# Patient Record
Sex: Female | Born: 1935
Health system: Southern US, Community
[De-identification: ages and names within clinical notes are randomized; demographics above are authoritative.]

## PROBLEM LIST (undated history)

## (undated) DIAGNOSIS — E079 Disorder of thyroid, unspecified: Secondary | ICD-10-CM

## (undated) DIAGNOSIS — I4891 Unspecified atrial fibrillation: Secondary | ICD-10-CM

## (undated) DIAGNOSIS — H269 Unspecified cataract: Secondary | ICD-10-CM

## (undated) DIAGNOSIS — M810 Age-related osteoporosis without current pathological fracture: Secondary | ICD-10-CM

## (undated) DIAGNOSIS — I509 Heart failure, unspecified: Secondary | ICD-10-CM

## (undated) DIAGNOSIS — M1990 Unspecified osteoarthritis, unspecified site: Secondary | ICD-10-CM

## (undated) DIAGNOSIS — I10 Essential (primary) hypertension: Secondary | ICD-10-CM

## (undated) HISTORY — PX: FRACTURE SURGERY: SHX138

## (undated) HISTORY — DX: Heart failure, unspecified: I50.9

## (undated) HISTORY — DX: Unspecified atrial fibrillation: I48.91

## (undated) HISTORY — DX: Unspecified cataract: H26.9

## (undated) HISTORY — DX: Disorder of thyroid, unspecified: E07.9

## (undated) HISTORY — DX: Age-related osteoporosis without current pathological fracture: M81.0

## (undated) HISTORY — DX: Unspecified osteoarthritis, unspecified site: M19.90

## (undated) HISTORY — DX: Essential (primary) hypertension: I10

## (undated) HISTORY — PX: EYE SURGERY: SHX253

---

## 2019-01-02 ENCOUNTER — Telehealth: Payer: Self-pay | Admitting: Family Medicine

## 2019-01-02 NOTE — Telephone Encounter (Signed)
Np - call pt's daughter for verbal orders to request medical records from [place].

## 2019-01-08 NOTE — Telephone Encounter (Signed)
I mailed medical release form.

## 2019-02-06 ENCOUNTER — Other Ambulatory Visit: Payer: Self-pay

## 2019-02-06 ENCOUNTER — Ambulatory Visit (INDEPENDENT_AMBULATORY_CARE_PROVIDER_SITE_OTHER): Payer: Medicare Other | Admitting: Family Medicine

## 2019-02-06 ENCOUNTER — Encounter: Payer: Self-pay | Admitting: Family Medicine

## 2019-02-06 VITALS — BP 132/88 | HR 70 | Temp 97.4°F | Ht 60.0 in | Wt 166.8 lb

## 2019-02-06 DIAGNOSIS — Z7901 Long term (current) use of anticoagulants: Secondary | ICD-10-CM

## 2019-02-06 DIAGNOSIS — E039 Hypothyroidism, unspecified: Secondary | ICD-10-CM | POA: Diagnosis not present

## 2019-02-06 DIAGNOSIS — D6869 Other thrombophilia: Secondary | ICD-10-CM

## 2019-02-06 DIAGNOSIS — I4819 Other persistent atrial fibrillation: Secondary | ICD-10-CM

## 2019-02-06 DIAGNOSIS — Z5181 Encounter for therapeutic drug level monitoring: Secondary | ICD-10-CM

## 2019-02-06 NOTE — Patient Instructions (Addendum)
° ° ° °  If you have lab work done today you will be contacted with your lab results within the next 2 weeks.  If you have not heard from us then please contact us. The fastest way to get your results is to register for My Chart. ° ° °IF you received an x-ray today, you will receive an invoice from [facility]. Please contact [facility] at [phone] with questions or concerns regarding your invoice.  ° °IF you received labwork today, you will receive an invoice from [facility]. Please contact [facility] at [phone] with questions or concerns regarding your invoice.  ° °Our billing staff will not be able to assist you with questions regarding bills from these companies. ° °You will be contacted with the lab results as soon as they are available. The fastest way to get your results is to activate your My Chart account. Instructions are located on the last page of this paperwork. If you have not heard from us regarding the results in 2 weeks, please contact this office. °  ° ° ° °

## 2019-02-06 NOTE — Progress Notes (Signed)
Established Patient Office Visit  Subjective:  Patient ID: Ashley Dunn, female    DOB: January 02, 1926  Age: 85 y.o. MRN: [id number]  CC:  Chief Complaint  Patient presents with  . New Patient (Initial Visit)    new to [place], pt is on coumadin and also needs referral for cards    HPI Ashley Dunn presents for   Patient is new to the area from [place] She has a history of multiple. medical problems  Persistent Atrial Fibrillation anticoagulated on coumadin She has been taking coumadin for years She moved here over the summer and has not had her INR checked No blood per nose, rectum or urine No bruising Lab Results  Component Value Date   INR 2.9 03/19/2019   INR 2.7 02/27/2019   INR 1.8 (H) 02/06/2019    Acquired hypothyroidism Hypothyroidism: Patient presents for evaluation of thyroid function. Symptoms consist of denies fatigue, weight changes, heat/cold intolerance, bowel/skin changes or CVS symptoms. The problem has been gradually improving.  Previous thyroid studies include TSH. The hypothyroidism is due to hypothyroidism. Lab Results  Component Value Date   TSH 0.077 (L) 02/06/2019     Past Medical History:  Diagnosis Date  . Arthritis   . Cataract   . CHF (congestive heart failure) ([place])   . Osteoporosis   . Thyroid disease     Past Surgical History:  Procedure Laterality Date  . CESAREAN SECTION    . EYE SURGERY    . FRACTURE SURGERY      Family History  Problem Relation Age of Onset  . Stroke Mother   . Cancer Father   . Hyperlipidemia Brother   . Healthy Daughter   . Healthy Son   . Heart disease Maternal Grandmother   . Heart disease Maternal Grandfather     Social History   Socioeconomic History  . Marital status: Widowed    Spouse name: Not on file  . Number of children: Not on file  . Years of education: Not on file  . Highest education level: Not on file  Occupational History  . Not on file  Social Needs  . Financial resource  strain: Not on file  . Food insecurity    Worry: Not on file    Inability: Not on file  . Transportation needs    Medical: Not on file    Non-medical: Not on file  Tobacco Use  . Smoking status: Never Smoker  . Smokeless tobacco: Never Used  Substance and Sexual Activity  . Alcohol use: Never    Frequency: Never  . Drug use: Never  . Sexual activity: Not Currently  Lifestyle  . Physical activity    Days per week: Not on file    Minutes per session: Not on file  . Stress: Not on file  Relationships  . Social Herbalist on phone: Not on file    Gets together: Not on file    Attends religious service: Not on file    Active member of club or organization: Not on file    Attends meetings of clubs or organizations: Not on file    Relationship status: Not on file  . Intimate partner violence    Fear of current or ex partner: Not on file    Emotionally abused: Not on file    Physically abused: Not on file    Forced sexual activity: Not on file  Other Topics Concern  . Not on file  Social History  Narrative   Lives at home with duaghter    Outpatient Medications Prior to Visit  Medication Sig Dispense Refill  . ARMOUR THYROID 180 MG tablet     . diltiazem (CARDIZEM CD) 240 MG 24 hr capsule     . metoprolol succinate (TOPROL-XL) 25 MG 24 hr tablet     . metoprolol succinate (TOPROL-XL) 50 MG 24 hr tablet     . torsemide (DEMADEX) 20 MG tablet     . warfarin (COUMADIN) 5 MG tablet      No facility-administered medications prior to visit.     Not on File  ROS Review of Systems Review of Systems  Constitutional: Negative for activity change, appetite change, chills and fever.  HENT: Negative for congestion, nosebleeds, trouble swallowing and voice change.   Respiratory: Negative for cough, shortness of breath and wheezing.   Gastrointestinal: Negative for diarrhea, nausea and vomiting.  Genitourinary: Negative for difficulty urinating, dysuria, flank pain and  hematuria.  Musculoskeletal: Negative for back pain, joint swelling and neck pain.  Neurological: Negative for dizziness, speech difficulty, light-headedness and numbness.  See HPI. All other review of systems negative.     Objective:    Physical Exam  BP 132/88 (BP Location: Left Arm, Patient Position: Sitting, Cuff Size: Normal)   Pulse 70   Temp (!) 97.4 F (36.3 C)   Ht 5' (1.524 m)   Wt 166 lb 12.8 oz (75.7 kg)   SpO2 91%   BMI 32.58 kg/m  Wt Readings from Last 3 Encounters:  02/06/19 166 lb 12.8 oz (75.7 kg)   Physical Exam  Constitutional: Oriented to person, place, and time. Appears well-developed and well-nourished.  HENT:  Head: Normocephalic and atraumatic.  Eyes: Conjunctivae and EOM are normal.  Neck: supple, no thyromegaly Cardiovascular: Normal rate, regular rhythm, normal heart sounds and intact distal pulses.  No murmur heard. Pulmonary/Chest: Effort normal and breath sounds normal. No stridor. No respiratory distress. Has no wheezes.  Neurological: Is alert and oriented to person, place, and time.  Skin: Skin is warm. Capillary refill takes less than 2 seconds.  Psychiatric: Has a normal mood and affect. Behavior is normal. Judgment and thought content normal.    There are no preventive care reminders to display for this patient.  There are no preventive care reminders to display for this patient.  No results found for: TSH No results found for: WBC, HGB, HCT, MCV, PLT No results found for: NA, K, CHLORIDE, CO2, GLUCOSE, BUN, CREATININE, BILITOT, ALKPHOS, AST, ALT, PROT, ALBUMIN, CALCIUM, ANIONGAP, EGFR, GFR No results found for: CHOL No results found for: HDL No results found for: LDLCALC No results found for: TRIG No results found for: CHOLHDL No results found for: HGBA1C    Assessment & Plan:   Problem List Items Addressed This Visit    None    Visit Diagnoses    Persistent atrial fibrillation (HCC)    -  Primary   Relevant Medications    diltiazem (CARDIZEM CD) 240 MG 24 hr capsule   metoprolol succinate (TOPROL-XL) 25 MG 24 hr tablet   metoprolol succinate (TOPROL-XL) 50 MG 24 hr tablet   torsemide (DEMADEX) 20 MG tablet   warfarin (COUMADIN) 5 MG tablet   Other Relevant Orders   Protime-INR   CMP14+EGFR   CBC   Lipid panel   Ambulatory referral to Cardiology   Anticoagulated on Coumadin       Relevant Orders   Protime-INR   CMP14+EGFR   Ambulatory referral  to Cardiology   Acquired thrombophilia ([place])       Relevant Orders   Protime-INR   CMP14+EGFR   Ambulatory referral to Cardiology   Acquired hypothyroidism       Relevant Medications   metoprolol succinate (TOPROL-XL) 25 MG 24 hr tablet   metoprolol succinate (TOPROL-XL) 50 MG 24 hr tablet   ARMOUR THYROID 180 MG tablet   Other Relevant Orders   TSH   Lipid panel   Encounter for medication monitoring       Relevant Orders   Lipid panel     Discussed and set up referral for continuity of care Will check levels  Sent records release    No orders of the defined types were placed in this encounter.   Follow-up: No follow-ups on file.    Forrest Moron, MD

## 2019-02-07 ENCOUNTER — Telehealth: Payer: Self-pay | Admitting: Family Medicine

## 2019-02-07 LAB — CMP14+EGFR
ALT: 19 IU/L (ref 0–32)
AST: 32 IU/L (ref 0–40)
Albumin/Globulin Ratio: 1.3 (ref 1.2–2.2)
Albumin: 4.1 g/dL (ref 3.5–4.6)
Alkaline Phosphatase: 92 IU/L (ref 39–117)
BUN/Creatinine Ratio: 29 — ABNORMAL HIGH (ref 12–28)
BUN: 39 mg/dL — ABNORMAL HIGH (ref 10–36)
Bilirubin Total: 0.6 mg/dL (ref 0.0–1.2)
CO2: 29 mmol/L (ref 20–29)
Calcium: 9.7 mg/dL (ref 8.7–10.3)
Chloride: 96 mmol/L (ref 96–106)
Creatinine, Ser: 1.33 mg/dL — ABNORMAL HIGH (ref 0.57–1.00)
GFR calc Af Amer: 40 mL/min/{1.73_m2} — ABNORMAL LOW (ref >59)
GFR calc non Af Amer: 35 mL/min/{1.73_m2} — ABNORMAL LOW (ref >59)
Globulin, Total: 3.1 g/dL (ref 1.5–4.5)
Glucose: 104 mg/dL — ABNORMAL HIGH (ref 65–99)
Potassium: 3.7 mmol/L (ref 3.5–5.2)
Sodium: 144 mmol/L (ref 134–144)
Total Protein: 7.2 g/dL (ref 6.0–8.5)

## 2019-02-07 LAB — CBC
Hematocrit: 48.4 % — ABNORMAL HIGH (ref 34.0–46.6)
Hemoglobin: 15.7 g/dL (ref 11.1–15.9)
MCH: 31.3 pg (ref 26.6–33.0)
MCHC: 32.4 g/dL (ref 31.5–35.7)
MCV: 96 fL (ref 79–97)
Platelets: 179 10*3/uL (ref 150–450)
RBC: 5.02 x10E6/uL (ref 3.77–5.28)
RDW: 12.8 % (ref 11.7–15.4)
WBC: 7.3 10*3/uL (ref 3.4–10.8)

## 2019-02-07 LAB — PROTIME-INR
INR: 1.8 — ABNORMAL HIGH (ref 0.9–1.2)
Prothrombin Time: 18.8 s — ABNORMAL HIGH (ref 9.1–12.0)

## 2019-02-07 LAB — LIPID PANEL
Chol/HDL Ratio: 3.4 ratio (ref 0.0–4.4)
Cholesterol, Total: 165 mg/dL (ref 100–199)
HDL: 48 mg/dL (ref >39)
LDL Chol Calc (NIH): 104 mg/dL — ABNORMAL HIGH (ref 0–99)
Triglycerides: 67 mg/dL (ref 0–149)
VLDL Cholesterol Cal: 13 mg/dL (ref 5–40)

## 2019-02-07 LAB — TSH: TSH: 0.077 u[IU]/mL — ABNORMAL LOW (ref 0.450–4.500)

## 2019-02-07 NOTE — Telephone Encounter (Signed)
Copied from [id number]. Topic: General - Other >> Feb 07, 2019  9:53 AM Leward Quan A wrote: Reason for CRM: Patient called to say that her online pharmacy is [facility] Ph# [phone]

## 2019-02-14 NOTE — Telephone Encounter (Signed)
Pharmacy has been updated.

## 2019-02-18 ENCOUNTER — Telehealth: Payer: Self-pay | Admitting: Family Medicine

## 2019-02-18 NOTE — Telephone Encounter (Signed)
Pt would like her lab results from 02/06/2019. Pt also states that she is having diarrhea for 3 days in a row after she eats lunch. Please advise. Please call [phone]

## 2019-02-20 ENCOUNTER — Encounter: Payer: Self-pay | Admitting: Family Medicine

## 2019-02-20 ENCOUNTER — Other Ambulatory Visit: Payer: Self-pay

## 2019-02-20 ENCOUNTER — Telehealth (INDEPENDENT_AMBULATORY_CARE_PROVIDER_SITE_OTHER): Payer: Medicare Other | Admitting: Family Medicine

## 2019-02-20 DIAGNOSIS — I4819 Other persistent atrial fibrillation: Secondary | ICD-10-CM

## 2019-02-20 DIAGNOSIS — E0580 Other thyrotoxicosis without thyrotoxic crisis or storm: Secondary | ICD-10-CM

## 2019-02-20 DIAGNOSIS — D6869 Other thrombophilia: Secondary | ICD-10-CM

## 2019-02-20 MED ORDER — THYROID 120 MG PO TABS: 120.0000 mg | ORAL_TABLET | Freq: Every day | ORAL | 1 refills | Status: DC

## 2019-02-20 NOTE — Progress Notes (Signed)
Pt is having bowel issues. Daughter says her mother is diagnosing herself saying that it could be ibs. Flare up usually happen in the evening. Keeping a log of food. Wanting to discuss lab result.

## 2019-02-20 NOTE — Progress Notes (Signed)
Telemedicine Encounter- SOAP NOTE Established Patient  This telephone encounter was conducted with the patient's (or proxy's) verbal consent via audio telecommunications: yes/no: Yes Patient was instructed to have this encounter in a suitably private space; and to only have persons present to whom they give permission to participate. In addition, patient identity was confirmed by use of name plus two identifiers (DOB and address).  I discussed the limitations, risks, security and privacy concerns of performing an evaluation and management service by telephone and the availability of in person appointments. I also discussed with the patient that there may be a patient responsible charge related to this service. The patient expressed understanding and agreed to proceed.  I spent a total of TIME; 0 MIN TO 60 MIN: 20 minutes talking with the patient or their proxy.  Cc: REVIEW of labs, loose stools, thyroid disease Subjective   Ashley Dunn is a 85 y.o. established patient. Telephone visit today for  HPI  Patient is reading about the diet for IBS and is following the recommendation for the diet. She denies blood per rectum.   She reports that she has an appointment with Cardiology 02/27/2019 She reports that she takes her Coumadin Her notes from Cardiology shows A. FIB PERSISTENT Lab Results  Component Value Date   INR 1.8 (H) 02/06/2019   She is compliant with her medications  Thyroid disease Pt has a history of hyperthyroidism She reports that she has been up and down on her armour thyroid before.   Lab Results  Component Value Date   TSH 0.077 (L) 02/06/2019      There are no active problems to display for this patient.   Past Medical History:  Diagnosis Date  . Arthritis   . Cataract   . CHF (congestive heart failure) ([place])   . Osteoporosis   . Thyroid disease     Current Outpatient Medications  Medication Sig Dispense Refill  . diltiazem (CARDIZEM CD) 240 MG 24  hr capsule     . metoprolol succinate (TOPROL-XL) 25 MG 24 hr tablet     . metoprolol succinate (TOPROL-XL) 50 MG 24 hr tablet     . thyroid (ARMOUR THYROID) 120 MG tablet Take 1 tablet (120 mg total) by mouth daily before breakfast. 90 tablet 1  . torsemide (DEMADEX) 20 MG tablet     . warfarin (COUMADIN) 5 MG tablet      No current facility-administered medications for this visit.     Not on File  Social History   Socioeconomic History  . Marital status: Widowed    Spouse name: Not on file  . Number of children: Not on file  . Years of education: Not on file  . Highest education level: Not on file  Occupational History  . Not on file  Social Needs  . Financial resource strain: Not on file  . Food insecurity    Worry: Not on file    Inability: Not on file  . Transportation needs    Medical: Not on file    Non-medical: Not on file  Tobacco Use  . Smoking status: Never Smoker  . Smokeless tobacco: Never Used  Substance and Sexual Activity  . Alcohol use: Never    Frequency: Never  . Drug use: Never  . Sexual activity: Not Currently  Lifestyle  . Physical activity    Days per week: Not on file    Minutes per session: Not on file  . Stress: Not on file  Relationships  . Social Herbalist on phone: Not on file    Gets together: Not on file    Attends religious service: Not on file    Active member of club or organization: Not on file    Attends meetings of clubs or organizations: Not on file    Relationship status: Not on file  . Intimate partner violence    Fear of current or ex partner: Not on file    Emotionally abused: Not on file    Physically abused: Not on file    Forced sexual activity: Not on file  Other Topics Concern  . Not on file  Social History Narrative   Lives at home with duaghter    ROS Review of Systems  Constitutional: Negative for activity change, appetite change, chills and fever.  HENT: Negative for congestion, nosebleeds,  trouble swallowing and voice change.   Respiratory: Negative for cough, shortness of breath and wheezing.   Gastrointestinal: ++diarrhea EVERY OTHER DAY, NO nausea and vomiting.  Genitourinary: Negative for difficulty urinating, dysuria, flank pain and hematuria.  Musculoskeletal: Negative for back pain, joint swelling and neck pain.  Neurological: Negative for dizziness, speech difficulty, light-headedness and numbness.  See HPI. All other review of systems negative.   Objective   Vitals as reported by the patient: There were no vitals filed for this visit.  Diagnoses and all orders for this visit:  Iatrogenic hyperthyroidism -     thyroid (ARMOUR THYROID) 120 MG tablet; Take 1 tablet (120 mg total) by mouth daily before breakfast.    Reviewed records Reviewed current labs Continue diet log Decreased dose of armour thyroid  Acquired thrombophilia ([place]) Persistent atrial fibrillation ([place])  Discussed her INR and that she should follow up with Cardiology Did not change dose but she is changing her diet quite a bit  She is compliant but eats greens    I discussed the assessment and treatment plan with the patient. The patient was provided an opportunity to ask questions and all were answered. The patient agreed with the plan and demonstrated an understanding of the instructions.   The patient was advised to call back or seek an in-person evaluation if the symptoms worsen or if the condition fails to improve as anticipated.  I provided 20 minutes of non-face-to-face time during this encounter.  Forrest Moron, MD  Primary Care at [facility]

## 2019-02-21 ENCOUNTER — Telehealth: Payer: Self-pay

## 2019-02-21 NOTE — Telephone Encounter (Signed)
Medical records faxed to Dr. Irven Shelling office per Dr. Nolon Rod request

## 2019-02-26 ENCOUNTER — Encounter: Payer: Self-pay | Admitting: Family Medicine

## 2019-02-26 DIAGNOSIS — I4821 Permanent atrial fibrillation: Secondary | ICD-10-CM | POA: Insufficient documentation

## 2019-02-26 DIAGNOSIS — I4819 Other persistent atrial fibrillation: Secondary | ICD-10-CM | POA: Insufficient documentation

## 2019-02-26 NOTE — Telephone Encounter (Signed)
I told the patient at the time of the follow up virtual visit. However please let the patient know that her INR is just a little less than 2. Continue her blood thinner. She has an appointment with the cardiologist tomorrow.  I already decrease her thyroid dose on 02/20/2019 and will check it in January at her follow up.  Her hemoglobin and cholesterol are great. She does not have any anemia.  Please mail the patient a copy of her results for her records. Will route letter to the clinical pool for printing.

## 2019-02-26 NOTE — Progress Notes (Signed)
Patient referred by Forrest Moron, MD for atrial fibrillation  Subjective:   Ashley Dunn, female    DOB: Mar 24, 1926, 85 y.o.   MRN: [id number]   Chief Complaint  Patient presents with  . Atrial Fibrillation  . New Patient (Initial Visit)    HPI  85 y.o. Caucasian female with hypothyroidism, controlled hypertension, persistent atrial flutter/fibrillation, h/o congestive heart failure.   Patient is here today with her daughter [name], who lives with her.  Both patient and her doctor moved from [place] to [place] earlier this summer.  Patient is quite active and independent for her age.  She walks around with a walker.  She has had diagnosis of atrial fibrillation since 2014.  She was previously seeing a cardiologist, Dr. Dellia Cloud in [place], [place].  She has been on diltiazem XL to 40 mg 1 capsule twice daily, and metoprolol succinate 25 mg in a.m. and 50 mg in p.m. for quite some time.  Although unusual dosing, she has tolerated this well.  In fact, she tells me that she had tachycardia in 120s then attempt to reduce diltiazem 240 mg to once daily.  She is also been on warfarin with well-controlled INR levels for a long time.  She has not had any previous cardioversion.  She has minimal to no symptoms in terms of palpitations, chest pain, shortness of breath with exertion.  She takes torsemide 20 mg 2 tablets daily, with no significant residual leg edema.  She denies any orthopnea, PND.  Her biggest complaint at this time is regarding her bowel movements.  She is seeing her PCP for the same.  She has hemorrhoids that bleed "once in a while".  She denies any melena, hematochezia, hematemesis.  She has arthritis, for which she occasionally takes Tylenol and does not use any NSAIDs.  Patient gets her medications through a mail order pharmacy.[facility] Number [phone]. She currently does not need any refills.   Past Medical History:  Diagnosis Date  .  Arthritis   . Atrial fibrillation ([place])   . Cataract   . CHF (congestive heart failure) ([place])   . Hypertension   . Osteoporosis   . Thyroid disease      Past Surgical History:  Procedure Laterality Date  . CESAREAN SECTION    . EYE SURGERY    . FRACTURE SURGERY       Social History   Socioeconomic History  . Marital status: Widowed    Spouse name: Not on file  . Number of children: Not on file  . Years of education: Not on file  . Highest education level: Not on file  Occupational History  . Not on file  Social Needs  . Financial resource strain: Not on file  . Food insecurity    Worry: Not on file    Inability: Not on file  . Transportation needs    Medical: Not on file    Non-medical: Not on file  Tobacco Use  . Smoking status: Never Smoker  . Smokeless tobacco: Never Used  Substance and Sexual Activity  . Alcohol use: Never    Frequency: Never  . Drug use: Never  . Sexual activity: Not Currently  Lifestyle  . Physical activity    Days per week: Not on file    Minutes per session: Not on file  . Stress: Not on file  Relationships  . Social Herbalist on phone: Not on file    Gets together: Not  on file    Attends religious service: Not on file    Active member of club or organization: Not on file    Attends meetings of clubs or organizations: Not on file    Relationship status: Not on file  . Intimate partner violence    Fear of current or ex partner: Not on file    Emotionally abused: Not on file    Physically abused: Not on file    Forced sexual activity: Not on file  Other Topics Concern  . Not on file  Social History Narrative   Lives at home with duaghter     Family History  Problem Relation Age of Onset  . Stroke Mother   . Cancer Father   . Hyperlipidemia Brother   . Healthy Daughter   . Healthy Son   . Heart disease Maternal Grandmother   . Heart disease Maternal Grandfather      Current Outpatient Medications on File  Prior to Visit  Medication Sig Dispense Refill  . diltiazem (CARDIZEM CD) 240 MG 24 hr capsule Take 240 mg by mouth 2 (two) times daily.     . metoprolol succinate (TOPROL-XL) 25 MG 24 hr tablet Take 25 mg by mouth daily.     . metoprolol succinate (TOPROL-XL) 50 MG 24 hr tablet Take 50 mg by mouth daily.     Marland Kitchen thyroid (ARMOUR THYROID) 120 MG tablet Take 1 tablet (120 mg total) by mouth daily before breakfast. 90 tablet 1  . torsemide (DEMADEX) 20 MG tablet Take 20 mg by mouth 2 (two) times daily.     Marland Kitchen warfarin (COUMADIN) 2.5 MG tablet Take 2.5 mg by mouth daily. Tue, Thur, Sat, Sun    . warfarin (COUMADIN) 5 MG tablet Take 5 mg by mouth daily. Monday, Wed, and friday     No current facility-administered medications on file prior to visit.     Cardiovascular studies:  EKG 02/27/2019: Atypical atrial flutter with variable AV conduction, ventricular rate 96 bpm. Nonspecific ST depression  -Nondiagnostic.   Recent labs: Results for DAVONNE, BABY (MRN [id number]) as of 02/26/2019 15:26  Ref. Range 02/06/2019 10:39  Sodium Latest Ref Range: 134 - 144 mmol/L 144  Potassium Latest Ref Range: 3.5 - 5.2 mmol/L 3.7  Chloride Latest Ref Range: 96 - 106 mmol/L 96  CO2 Latest Ref Range: 20 - 29 mmol/L 29  Glucose Latest Ref Range: 65 - 99 mg/dL 104 (H)  BUN Latest Ref Range: 10 - 36 mg/dL 39 (H)  Creatinine Latest Ref Range: 0.57 - 1.00 mg/dL 1.33 (H)  Calcium Latest Ref Range: 8.7 - 10.3 mg/dL 9.7  BUN/Creatinine Ratio Latest Ref Range: 12 - 28  29 (H)  Alkaline Phosphatase Latest Ref Range: 39 - 117 IU/L 92  Albumin Latest Ref Range: 3.5 - 4.6 g/dL 4.1  Albumin/Globulin Ratio Latest Ref Range: 1.2 - 2.2  1.3  AST Latest Ref Range: 0 - 40 IU/L 32  ALT Latest Ref Range: 0 - 32 IU/L 19  Total Protein Latest Ref Range: 6.0 - 8.5 g/dL 7.2  Total Bilirubin Latest Ref Range: 0.0 - 1.2 mg/dL 0.6  GFR, Est Non African American Latest Ref Range: >59 mL/min/1.73 35 (L)  GFR, Est African American  Latest Ref Range: >59 mL/min/1.73 40 (L)  Total CHOL/HDL Ratio Latest Ref Range: 0.0 - 4.4 ratio 3.4  Cholesterol, Total Latest Ref Range: 100 - 199 mg/dL 165  HDL Cholesterol Latest Ref Range: >39 mg/dL 48  Triglycerides Latest Ref Range:  0 - 149 mg/dL 67  VLDL Cholesterol Cal Latest Ref Range: 5 - 40 mg/dL 13  LDL Chol Calc (NIH) Latest Ref Range: 0 - 99 mg/dL 104 (H)   Results for MCKYNLIE, VANDERSLICE (MRN [id number]) as of 02/26/2019 15:27  Ref. Range 02/06/2019 10:39  WBC Latest Ref Range: 3.4 - 10.8 x10E3/uL 7.3  RBC Latest Ref Range: 3.77 - 5.28 x10E6/uL 5.02  Hemoglobin Latest Ref Range: 11.1 - 15.9 g/dL 15.7  HCT Latest Ref Range: 34.0 - 46.6 % 48.4 (H)  MCV Latest Ref Range: 79 - 97 fL 96  MCH Latest Ref Range: 26.6 - 33.0 pg 31.3  MCHC Latest Ref Range: 31.5 - 35.7 g/dL 32.4  RDW Latest Ref Range: 11.7 - 15.4 % 12.8  Platelets Latest Ref Range: 150 - 450 x10E3/uL 179   Results for BETZY, BARBIER (MRN [id number]) as of 02/26/2019 15:27  Ref. Range 02/06/2019 10:39  Prothrombin Time Latest Ref Range: 9.1 - 12.0 sec 18.8 (H)  INR Latest Ref Range: 0.9 - 1.2  1.8 (H)   Results for SHARIFAH, CHAMPINE (MRN [id number]) as of 02/26/2019 15:27  Ref. Range 02/06/2019 10:39  TSH Latest Ref Range: 0.450 - 4.500 uIU/mL 0.077 (L)    Review of Systems  Constitution: Negative for decreased appetite, malaise/fatigue, weight gain and weight loss.  HENT: Negative for congestion.   Eyes: Negative for visual disturbance.  Cardiovascular: Negative for chest pain, dyspnea on exertion, leg swelling (Controlled), palpitations and syncope.  Respiratory: Negative for cough.   Endocrine: Negative for cold intolerance.  Hematologic/Lymphatic: Does not bruise/bleed easily.  Skin: Negative for itching and rash.  Musculoskeletal: Negative for myalgias.  Gastrointestinal: Negative for abdominal pain, nausea and vomiting.  Genitourinary: Negative for dysuria.  Neurological: Negative for dizziness and weakness.   Psychiatric/Behavioral: The patient is not nervous/anxious.   All other systems reviewed and are negative.        Vitals:   02/27/19 0947  BP: 137/66  Pulse: (!) 46  Temp: 98 F (36.7 C)  SpO2: 94%     Body mass index is 31.25 kg/m. Filed Weights   02/27/19 0947  Weight: 160 lb (72.6 kg)     Objective:   Physical Exam  Constitutional: She is oriented to person, place, and time. She appears well-developed and well-nourished. No distress.  HENT:  Head: Normocephalic and atraumatic.  Eyes: Pupils are equal, round, and reactive to light. Conjunctivae are normal.  Neck: No JVD present.  Cardiovascular: Normal rate. An irregularly irregular rhythm present. Exam reveals decreased pulses.  No murmur heard. Pulses:      Dorsalis pedis pulses are 0 on the right side and 0 on the left side.       Posterior tibial pulses are 1+ on the right side and 1+ on the left side.  Pulmonary/Chest: Effort normal and breath sounds normal. She has no wheezes. She has no rales.  Abdominal: Soft. Bowel sounds are normal. There is no rebound.  Musculoskeletal:        General: No edema.  Lymphadenopathy:    She has no cervical adenopathy.  Neurological: She is alert and oriented to person, place, and time. No cranial nerve deficit.  Skin: Skin is warm and dry.  B/l LE varicose veins without ulcers/edema  Psychiatric: She has a normal mood and affect.  Nursing note and vitals reviewed.         Assessment & Recommendations:    85 y.o. Caucasian female with hypothyroidism, controlled hypertension, persistent atrial flutter/fibrillation, h/o  congestive heart failure.   Permanent atrial fibrillation/flutter: CHA2DS2VASc score 3, annual stroke risk 3.2%. Tolerating warfarin well. Okay to continue.  Currently 5 mg Monday Wednesday, 2.5 mg all other days.  Given her advanced age, lack of symptoms, I will treat this as permanent atrial flutter/fibrillation without any attempt to restore  sinus rhythm.  She is on rather high doses of diltiazem XL 240 mg twice daily, metoprolol succinate 25 mg a.m., and 50 mg p.m.  It appears that she has been on these doses for several years and tolerating well.  I will obtain records from her previous cardiologist.  I have not made any changes to her current regimen today.  INR check in 3 week OV with me in 3 months   Thank you for referring the patient to [place]. Please feel free to contact with any questions.  Nigel Mormon, MD [facility] Pager: [phone] Office: [phone]

## 2019-02-26 NOTE — Telephone Encounter (Signed)
Hi Dr. Nolon Rod,   Ms. Franko is requesting her lab results from 10/14 but I'm the system isn't indicating that they've been reviewed.  Please let me know if you need me to do anything.   Thank you,  Wilfred Curtis

## 2019-02-27 ENCOUNTER — Encounter: Payer: Self-pay | Admitting: Cardiology

## 2019-02-27 ENCOUNTER — Other Ambulatory Visit: Payer: Self-pay

## 2019-02-27 ENCOUNTER — Ambulatory Visit (INDEPENDENT_AMBULATORY_CARE_PROVIDER_SITE_OTHER): Payer: Medicare Other | Admitting: Cardiology

## 2019-02-27 VITALS — BP 137/66 | HR 46 | Temp 98.0°F | Ht 60.0 in | Wt 160.0 lb

## 2019-02-27 DIAGNOSIS — Z7901 Long term (current) use of anticoagulants: Secondary | ICD-10-CM

## 2019-02-27 DIAGNOSIS — I4819 Other persistent atrial fibrillation: Secondary | ICD-10-CM | POA: Diagnosis not present

## 2019-02-27 LAB — POCT INR: INR: 2.7 (ref 2.0–3.0)

## 2019-02-28 ENCOUNTER — Telehealth: Payer: Self-pay | Admitting: Family Medicine

## 2019-02-28 NOTE — Telephone Encounter (Signed)
Pt was given a referral to [facility]. They would like pt's cardo medical records from Gordy Levan, MD phone # [phone] faxed over to their office at fax [phone].

## 2019-03-01 NOTE — Telephone Encounter (Signed)
Call pt and relayed the message to daughter. Lab letter sent to outgoing mail.

## 2019-03-08 ENCOUNTER — Telehealth: Payer: Self-pay | Admitting: Family Medicine

## 2019-03-08 NOTE — Telephone Encounter (Signed)
During the call to reschedule pt. Wished a message to be passed to the doctor. Pt. Asserts that the recent change (180mg  to 120mg  reduction) of her thyroid medication did not resolve her digestive issues and instead caused new ones. Pt. Began taking the 180mg  tablet again and asserts issues went away. With respect to the digestive issues pt. Asserts that with recent exercise and some minor dietary changes the issues have been resolved.

## 2019-03-08 NOTE — Telephone Encounter (Signed)
Called pt LVM for her to call us at her convenience   FR

## 2019-03-19 ENCOUNTER — Other Ambulatory Visit: Payer: Self-pay

## 2019-03-19 ENCOUNTER — Ambulatory Visit (INDEPENDENT_AMBULATORY_CARE_PROVIDER_SITE_OTHER): Payer: Medicare Other | Admitting: Cardiology

## 2019-03-19 DIAGNOSIS — Z5181 Encounter for therapeutic drug level monitoring: Secondary | ICD-10-CM | POA: Diagnosis not present

## 2019-03-19 DIAGNOSIS — I4821 Permanent atrial fibrillation: Secondary | ICD-10-CM | POA: Diagnosis not present

## 2019-03-19 DIAGNOSIS — I4891 Unspecified atrial fibrillation: Secondary | ICD-10-CM | POA: Insufficient documentation

## 2019-03-19 DIAGNOSIS — Z7901 Long term (current) use of anticoagulants: Secondary | ICD-10-CM

## 2019-03-19 LAB — POCT INR: INR: 2.9 (ref 2.0–3.0)

## 2019-03-19 NOTE — Progress Notes (Signed)
New maintenance plan:  5 mg every Mon, Wed; 2.5 mg all other days [] No changeStart Over  Maintenance plan weekly total: 22.5 mg Tablets on hand: 5 mg & 2.5 mg  Total dose from past 7 days: N/A     INR within range, but continues to slightly elevate. Will continue with current regimen and repeat in 3 weeks.

## 2019-04-02 ENCOUNTER — Ambulatory Visit (INDEPENDENT_AMBULATORY_CARE_PROVIDER_SITE_OTHER): Payer: Medicare Other | Admitting: Cardiology

## 2019-04-02 ENCOUNTER — Other Ambulatory Visit: Payer: Self-pay

## 2019-04-02 DIAGNOSIS — I4821 Permanent atrial fibrillation: Secondary | ICD-10-CM | POA: Diagnosis not present

## 2019-04-02 DIAGNOSIS — Z5181 Encounter for therapeutic drug level monitoring: Secondary | ICD-10-CM | POA: Diagnosis not present

## 2019-04-02 DIAGNOSIS — Z7901 Long term (current) use of anticoagulants: Secondary | ICD-10-CM | POA: Diagnosis not present

## 2019-04-02 LAB — POCT INR: INR: 5.7 — AB (ref 2.0–3.0)

## 2019-04-05 NOTE — Progress Notes (Signed)
Current maintenance plan:  5 mg every Mon, Wed; 2.5 mg all other days [] No changeStart Over  Maintenance plan weekly total: 22.5 mg Tablets on hand: 5 mg & 2.5 mg  Total dose from past 7 days: 22.5 mg    Patient was confused by previous INR dosing instructions, and was taking 5 mg all days except for  M and W in which she was taking 2.5 mg on those days. She will hold her Coumadin for the next 2 days and then resume her usual maintenance plan. Will recheck in 1 week.

## 2019-04-05 NOTE — Addendum Note (Signed)
Addended by: Gwinda Maine on: 04/05/2019 12:55 PM   Modules accepted: Level of Service

## 2019-04-09 ENCOUNTER — Ambulatory Visit (INDEPENDENT_AMBULATORY_CARE_PROVIDER_SITE_OTHER): Payer: Medicare Other | Admitting: Cardiology

## 2019-04-09 ENCOUNTER — Other Ambulatory Visit: Payer: Self-pay

## 2019-04-09 DIAGNOSIS — Z7901 Long term (current) use of anticoagulants: Secondary | ICD-10-CM

## 2019-04-09 DIAGNOSIS — Z5181 Encounter for therapeutic drug level monitoring: Secondary | ICD-10-CM | POA: Diagnosis not present

## 2019-04-09 DIAGNOSIS — I4821 Permanent atrial fibrillation: Secondary | ICD-10-CM | POA: Diagnosis not present

## 2019-04-09 LAB — POCT INR: INR: 1.6 — AB (ref 2.0–3.0)

## 2019-04-09 NOTE — Progress Notes (Signed)
New maintenance plan:  5 mg every Tue, Thu, Sat; 2.5 mg all other days [] No changeStart Over  Maintenance plan weekly total: 25 mg11.1% Tablets on hand: 5 mg & 2.5 mg  Total dose from past 7 days: 22.5 mg    Changes were made 5 mg Tues, Thu, and Sat, then 2.5 all the other days, will return in 10 days  GOAL: 2-3 for A fib Last INR: 5.7

## 2019-04-22 ENCOUNTER — Ambulatory Visit (INDEPENDENT_AMBULATORY_CARE_PROVIDER_SITE_OTHER): Payer: Medicare Other | Admitting: Cardiology

## 2019-04-22 ENCOUNTER — Other Ambulatory Visit: Payer: Self-pay

## 2019-04-22 DIAGNOSIS — Z5181 Encounter for therapeutic drug level monitoring: Secondary | ICD-10-CM

## 2019-04-22 DIAGNOSIS — I480 Paroxysmal atrial fibrillation: Secondary | ICD-10-CM

## 2019-04-22 DIAGNOSIS — Z7901 Long term (current) use of anticoagulants: Secondary | ICD-10-CM

## 2019-04-22 LAB — POCT INR: INR: 2.2 (ref 2.0–3.0)

## 2019-04-22 NOTE — Progress Notes (Signed)
Goal: 2.0-3.0 Indication: Afib Today's INR: 2.2 (Last INR 1.6 on 12/15. Dose was changed from 5 mgX2 days 2.5 mg all other days-->5 mg X3 days, 2.5 mg all other days) Current dose: 5 mg T, Th, Sa, 2.5 mg all other days  New dose:Same  Next INR check: 3 weeks  Vernell Leep, MD

## 2019-04-29 ENCOUNTER — Encounter: Payer: Self-pay | Admitting: Family Medicine

## 2019-04-29 ENCOUNTER — Ambulatory Visit (INDEPENDENT_AMBULATORY_CARE_PROVIDER_SITE_OTHER): Payer: Medicare Other | Admitting: Family Medicine

## 2019-04-29 ENCOUNTER — Other Ambulatory Visit: Payer: Self-pay

## 2019-04-29 VITALS — BP 125/73 | HR 73 | Temp 97.8°F | Resp 17 | Ht 60.0 in | Wt 150.4 lb

## 2019-04-29 DIAGNOSIS — E0580 Other thyrotoxicosis without thyrotoxic crisis or storm: Secondary | ICD-10-CM

## 2019-04-29 DIAGNOSIS — D6869 Other thrombophilia: Secondary | ICD-10-CM

## 2019-04-29 DIAGNOSIS — Z23 Encounter for immunization: Secondary | ICD-10-CM | POA: Diagnosis not present

## 2019-04-29 NOTE — Progress Notes (Signed)
Established Patient Office Visit  Subjective:  Patient ID: Ashley Dunn, female    DOB: 1925/10/10  Age: 86 y.o. MRN: [id number]  CC:  Chief Complaint  Patient presents with  . 3 month f/u    hyperthyroidism.  Cardiologist adjusted pt coumadin and pt is back on 180 of the thyroid medication per daughter.  No refills needed at this time    HPI Ashley Dunn presents for   Hyperthyroidism Pt reports that she increased her armour thyroid from 120 to 180 because when she reduced the dose to 120 from 180 she had more loose stools and more frequency with defecation Now she states that her bowels have improved She is stating that she is taking the thyroid medication with all her other meds in the morning She has been taking her thyroid dose this way for years Lab Results  Component Value Date   TSH 0.077 (L) 02/06/2019    Anticoagulated Hypertension  Her Coumadin is being managed by Cardiology Her bp is good on the cardizem, troprol-xl, torsemide She denies dizziness, chest pains, palpitations She avoids salty foods She is compliant with her meds BP Readings from Last 3 Encounters:  04/29/19 125/73  02/27/19 137/66  02/06/19 132/88     Past Medical History:  Diagnosis Date  . Arthritis   . Atrial fibrillation ([place])   . Cataract   . CHF (congestive heart failure) ([place])   . Hypertension   . Osteoporosis   . Thyroid disease     Past Surgical History:  Procedure Laterality Date  . CESAREAN SECTION    . EYE SURGERY    . FRACTURE SURGERY      Family History  Problem Relation Age of Onset  . Stroke Mother   . Cancer Father   . Hyperlipidemia Brother   . Healthy Daughter   . Healthy Son   . Heart disease Maternal Grandmother   . Heart disease Maternal Grandfather     Social History   Socioeconomic History  . Marital status: Widowed    Spouse name: Not on file  . Number of children: Not on file  . Years of education: Not on file  . Highest education level:  Not on file  Occupational History  . Not on file  Tobacco Use  . Smoking status: Never Smoker  . Smokeless tobacco: Never Used  Substance and Sexual Activity  . Alcohol use: Never  . Drug use: Never  . Sexual activity: Not Currently  Other Topics Concern  . Not on file  Social History Narrative   Lives at home with duaghter   Social Determinants of Health   Financial Resource Strain:   . Difficulty of Paying Living Expenses: Not on file  Food Insecurity:   . Worried About Charity fundraiser in the Last Year: Not on file  . Ran Out of Food in the Last Year: Not on file  Transportation Needs:   . Lack of Transportation (Medical): Not on file  . Lack of Transportation (Non-Medical): Not on file  Physical Activity:   . Days of Exercise per Week: Not on file  . Minutes of Exercise per Session: Not on file  Stress:   . Feeling of Stress : Not on file  Social Connections:   . Frequency of Communication with Friends and Family: Not on file  . Frequency of Social Gatherings with Friends and Family: Not on file  . Attends Religious Services: Not on file  . Active Member of Clubs  or Organizations: Not on file  . Attends Archivist Meetings: Not on file  . Marital Status: Not on file  Intimate Partner Violence:   . Fear of Current or Ex-Partner: Not on file  . Emotionally Abused: Not on file  . Physically Abused: Not on file  . Sexually Abused: Not on file    Outpatient Medications Prior to Visit  Medication Sig Dispense Refill  . diltiazem (CARDIZEM CD) 240 MG 24 hr capsule Take 240 mg by mouth 2 (two) times daily.     . metoprolol succinate (TOPROL-XL) 25 MG 24 hr tablet Take 25 mg by mouth daily.     . metoprolol succinate (TOPROL-XL) 50 MG 24 hr tablet Take 50 mg by mouth daily.     Marland Kitchen thyroid (ARMOUR THYROID) 120 MG tablet Take 1 tablet (120 mg total) by mouth daily before breakfast. 90 tablet 1  . torsemide (DEMADEX) 20 MG tablet Take 20 mg by mouth 2 (two) times  daily.     Marland Kitchen warfarin (COUMADIN) 2.5 MG tablet Take 2.5 mg by mouth daily. Tue, Thur, Sat, Sun    . warfarin (COUMADIN) 5 MG tablet Take 5 mg by mouth daily. Monday, Wed, and friday     No facility-administered medications prior to visit.    No Known Allergies  ROS Review of Systems Review of Systems  Constitutional: Negative for activity change, appetite change, chills and fever.  HENT: Negative for congestion, nosebleeds, trouble swallowing and voice change.   Respiratory: Negative for cough, shortness of breath and wheezing.   Gastrointestinal: Negative for diarrhea, nausea and vomiting.  Genitourinary: Negative for difficulty urinating, dysuria, flank pain and hematuria.  Musculoskeletal: Negative for back pain, joint swelling and neck pain.  Neurological: Negative for dizziness, speech difficulty, light-headedness and numbness.  See HPI. All other review of systems negative.     Objective:    Physical Exam  BP 125/73 (BP Location: Right Arm, Patient Position: Sitting, Cuff Size: Normal)   Pulse 73   Temp 97.8 F (36.6 C) (Oral)   Resp 17   Ht 5' (1.524 m)   Wt 150 lb 6.4 oz (68.2 kg)   SpO2 92%   BMI 29.37 kg/m  Wt Readings from Last 3 Encounters:  04/29/19 150 lb 6.4 oz (68.2 kg)  02/27/19 160 lb (72.6 kg)  02/06/19 166 lb 12.8 oz (75.7 kg)   Physical Exam  Constitutional: Oriented to person, place, and time. Appears well-developed and well-nourished.  HENT:  Head: Normocephalic and atraumatic.  Eyes: Conjunctivae and EOM are normal.  Neck: supple, no masses, no thyromegaly Cardiovascular: Normal rate, regular rhythm, normal heart sounds and intact distal pulses.  No murmur heard. Pulmonary/Chest: Effort normal and breath sounds normal. No stridor. No respiratory distress. Has no wheezes.  Neurological: Is alert and oriented to person, place, and time.  Skin: Skin is warm. Capillary refill takes less than 2 seconds.  Psychiatric: Has a normal mood and affect.  Behavior is normal. Judgment and thought content normal.    There are no preventive care reminders to display for this patient.  There are no preventive care reminders to display for this patient.  Lab Results  Component Value Date   TSH 0.077 (L) 02/06/2019   Lab Results  Component Value Date   WBC 7.3 02/06/2019   HGB 15.7 02/06/2019   HCT 48.4 (H) 02/06/2019   MCV 96 02/06/2019   PLT 179 02/06/2019   Lab Results  Component Value Date   NA  144 02/06/2019   K 3.7 02/06/2019   CO2 29 02/06/2019   GLUCOSE 104 (H) 02/06/2019   BUN 39 (H) 02/06/2019   CREATININE 1.33 (H) 02/06/2019   BILITOT 0.6 02/06/2019   ALKPHOS 92 02/06/2019   AST 32 02/06/2019   ALT 19 02/06/2019   PROT 7.2 02/06/2019   ALBUMIN 4.1 02/06/2019   CALCIUM 9.7 02/06/2019   Lab Results  Component Value Date   CHOL 165 02/06/2019   Lab Results  Component Value Date   HDL 48 02/06/2019   Lab Results  Component Value Date   LDLCALC 104 (H) 02/06/2019   Lab Results  Component Value Date   TRIG 67 02/06/2019   Lab Results  Component Value Date   CHOLHDL 3.4 02/06/2019   No results found for: HGBA1C    Assessment & Plan:   Problem List Items Addressed This Visit    None    Visit Diagnoses    Acquired thrombophilia ([place])    -  Primary Discussed that she should continue with the Coumadin clinic   Need for prophylactic vaccination and inoculation against influenza       Iatrogenic hyperthyroidism     Pt now on armour thyroid 180mg , performed counseling on how to take meds, will assess levels Today's labs are on the armour thyroid not taken correctly Will have patient take correctly and reassess   Relevant Orders   TSH   T4, Free   T3, Free      No orders of the defined types were placed in this encounter.   Follow-up: No follow-ups on file.   Will get labs done ahead of the next appt which will be virtual given patient's age  A total of 25 minutes were spent face-to-face with  the patient during this encounter and over half of that time was spent on counseling and coordination of care.   Forrest Moron, MD

## 2019-04-29 NOTE — Patient Instructions (Signed)
° ° ° °  If you have lab work done today you will be contacted with your lab results within the next 2 weeks.  If you have not heard from us then please contact us. The fastest way to get your results is to register for My Chart. ° ° °IF you received an x-ray today, you will receive an invoice from [facility]. Please contact [facility] at [phone] with questions or concerns regarding your invoice.  ° °IF you received labwork today, you will receive an invoice from [facility]. Please contact [facility] at [phone] with questions or concerns regarding your invoice.  ° °Our billing staff will not be able to assist you with questions regarding bills from these companies. ° °You will be contacted with the lab results as soon as they are available. The fastest way to get your results is to activate your My Chart account. Instructions are located on the last page of this paperwork. If you have not heard from us regarding the results in 2 weeks, please contact this office. °  ° ° ° °

## 2019-04-30 LAB — T4, FREE: Free T4: 1.5 ng/dL (ref 0.82–1.77)

## 2019-04-30 LAB — T3, FREE: T3, Free: 5.4 pg/mL — ABNORMAL HIGH (ref 2.0–4.4)

## 2019-04-30 LAB — TSH: TSH: 0.043 u[IU]/mL — ABNORMAL LOW (ref 0.450–4.500)

## 2019-05-07 ENCOUNTER — Telehealth: Payer: Self-pay | Admitting: Family Medicine

## 2019-05-07 NOTE — Telephone Encounter (Signed)
Please reach out to patient. Patient is needing her most recent labs results. Please call Her Daughter Sol Passer [phone]

## 2019-05-09 ENCOUNTER — Ambulatory Visit: Payer: Medicare Other | Admitting: Family Medicine

## 2019-05-09 NOTE — Telephone Encounter (Signed)
Pt requesting lab results. Please Advise

## 2019-05-14 ENCOUNTER — Other Ambulatory Visit: Payer: Self-pay

## 2019-05-14 ENCOUNTER — Other Ambulatory Visit: Payer: Self-pay | Admitting: Family Medicine

## 2019-05-14 ENCOUNTER — Ambulatory Visit (INDEPENDENT_AMBULATORY_CARE_PROVIDER_SITE_OTHER): Payer: Medicare Other | Admitting: Cardiology

## 2019-05-14 DIAGNOSIS — Z5181 Encounter for therapeutic drug level monitoring: Secondary | ICD-10-CM | POA: Diagnosis not present

## 2019-05-14 DIAGNOSIS — Z7901 Long term (current) use of anticoagulants: Secondary | ICD-10-CM

## 2019-05-14 DIAGNOSIS — I4821 Permanent atrial fibrillation: Secondary | ICD-10-CM

## 2019-05-14 DIAGNOSIS — I480 Paroxysmal atrial fibrillation: Secondary | ICD-10-CM | POA: Diagnosis not present

## 2019-05-14 DIAGNOSIS — E0580 Other thyrotoxicosis without thyrotoxic crisis or storm: Secondary | ICD-10-CM

## 2019-05-14 LAB — POCT INR: INR: 2.3 (ref 2.0–3.0)

## 2019-05-14 MED ORDER — THYROID 120 MG PO TABS: 120.0000 mg | ORAL_TABLET | Freq: Every day | ORAL | 1 refills | Status: DC

## 2019-05-14 MED ORDER — METOPROLOL SUCCINATE ER 25 MG PO TB24: 25.0000 mg | ORAL_TABLET | Freq: Every day | ORAL | 3 refills | Status: DC

## 2019-05-14 NOTE — Progress Notes (Signed)
Current maintenance plan:  5 mg every Tue, Thu, Sat; 2.5 mg all other days [x] No changeStart Over  Maintenance plan weekly total: 25 mg Tablets on hand: 5 mg & 2.5 mg  Total dose from past 7 days: 25 mg    INR within range of 2-3 for A fib. Continue with current regimen. Will repeat INR in 1 month.

## 2019-05-14 NOTE — Telephone Encounter (Signed)
Copied from [place] [id number]. Topic: Quick Communication - Rx Refill/Question >> May 14, 2019 11:42 AM Yvette Rack wrote: Medication: ARMOUR THYROID 180 MG tablet  Has the patient contacted their pharmacy? yes   Preferred Pharmacy (with phone number or street name): [place], [place] - [address]  Phone: [phone]   Fax: [phone]  Agent: Please be advised that RX refills may take up to 3 business days. We ask that you follow-up with your pharmacy.

## 2019-05-14 NOTE — Telephone Encounter (Signed)
Pt daughter stated they still have not received a call back with pt lab results. Pt daughter requests call back asap.

## 2019-05-15 NOTE — Telephone Encounter (Signed)
Please advise on lab message. Result are back but I did not see any info to relay to pt and her daughter.

## 2019-05-27 ENCOUNTER — Telehealth: Payer: Self-pay | Admitting: Family Medicine

## 2019-05-27 NOTE — Telephone Encounter (Signed)
Please advise if this is ok to do.

## 2019-05-27 NOTE — Telephone Encounter (Signed)
Pt needs an order placed for 60 mg for armour thyroid because the insurance no longer provides 180 mg. She has the 120 mg and needs the 60 mg to complete the difference.  She would like the order to be placed with [facility] [employer]

## 2019-05-27 NOTE — Telephone Encounter (Signed)
Forwarding refill request to you

## 2019-05-27 NOTE — Telephone Encounter (Signed)
Patient is calling about the lab results but I do not see where labs has been reviewed by you. Not sure what to tell the patient about her labs. Please advise

## 2019-05-27 NOTE — Telephone Encounter (Signed)
Pt would like a cb with her labs and a letter with the results. She states her daughter has called twice and office has not returned call. Please advise

## 2019-05-29 ENCOUNTER — Emergency Department (HOSPITAL_COMMUNITY): Payer: Medicare Other

## 2019-05-29 ENCOUNTER — Encounter: Payer: Self-pay | Admitting: Family Medicine

## 2019-05-29 ENCOUNTER — Inpatient Hospital Stay (HOSPITAL_COMMUNITY)
Admission: EM | Admit: 2019-05-29 | Discharge: 2019-06-02 | DRG: 372 | Disposition: A | Discharge status: Home / Self Care | Payer: Medicare Other | Attending: Internal Medicine | Admitting: Internal Medicine

## 2019-05-29 ENCOUNTER — Other Ambulatory Visit: Payer: Self-pay

## 2019-05-29 ENCOUNTER — Encounter (HOSPITAL_COMMUNITY): Payer: Self-pay | Admitting: Emergency Medicine

## 2019-05-29 ENCOUNTER — Ambulatory Visit (INDEPENDENT_AMBULATORY_CARE_PROVIDER_SITE_OTHER): Payer: Medicare Other | Admitting: Family Medicine

## 2019-05-29 VITALS — BP 136/80 | HR 70 | Temp 98.1°F | Resp 17 | Ht 60.0 in | Wt 164.0 lb

## 2019-05-29 DIAGNOSIS — K567 Ileus, unspecified: Secondary | ICD-10-CM | POA: Diagnosis present

## 2019-05-29 DIAGNOSIS — I4821 Permanent atrial fibrillation: Secondary | ICD-10-CM | POA: Diagnosis present

## 2019-05-29 DIAGNOSIS — M810 Age-related osteoporosis without current pathological fracture: Secondary | ICD-10-CM | POA: Diagnosis present

## 2019-05-29 DIAGNOSIS — Z8249 Family history of ischemic heart disease and other diseases of the circulatory system: Secondary | ICD-10-CM

## 2019-05-29 DIAGNOSIS — R100 Acute abdomen: Secondary | ICD-10-CM

## 2019-05-29 DIAGNOSIS — B964 Proteus (mirabilis) (morganii) as the cause of diseases classified elsewhere: Secondary | ICD-10-CM | POA: Diagnosis present

## 2019-05-29 DIAGNOSIS — N281 Cyst of kidney, acquired: Secondary | ICD-10-CM | POA: Diagnosis present

## 2019-05-29 DIAGNOSIS — I10 Essential (primary) hypertension: Secondary | ICD-10-CM | POA: Diagnosis present

## 2019-05-29 DIAGNOSIS — Z66 Do not resuscitate: Secondary | ICD-10-CM | POA: Diagnosis not present

## 2019-05-29 DIAGNOSIS — I110 Hypertensive heart disease with heart failure: Secondary | ICD-10-CM | POA: Diagnosis not present

## 2019-05-29 DIAGNOSIS — D6869 Other thrombophilia: Secondary | ICD-10-CM | POA: Diagnosis not present

## 2019-05-29 DIAGNOSIS — Z79899 Other long term (current) drug therapy: Secondary | ICD-10-CM | POA: Diagnosis not present

## 2019-05-29 DIAGNOSIS — Z20822 Contact with and (suspected) exposure to covid-19: Secondary | ICD-10-CM | POA: Diagnosis present

## 2019-05-29 DIAGNOSIS — Z7901 Long term (current) use of anticoagulants: Secondary | ICD-10-CM

## 2019-05-29 DIAGNOSIS — R1031 Right lower quadrant pain: Secondary | ICD-10-CM

## 2019-05-29 DIAGNOSIS — E079 Disorder of thyroid, unspecified: Secondary | ICD-10-CM | POA: Diagnosis present

## 2019-05-29 DIAGNOSIS — I509 Heart failure, unspecified: Secondary | ICD-10-CM

## 2019-05-29 DIAGNOSIS — I4892 Unspecified atrial flutter: Secondary | ICD-10-CM | POA: Diagnosis present

## 2019-05-29 DIAGNOSIS — I4819 Other persistent atrial fibrillation: Secondary | ICD-10-CM | POA: Diagnosis not present

## 2019-05-29 DIAGNOSIS — R000 Tachycardia, unspecified: Secondary | ICD-10-CM | POA: Diagnosis present

## 2019-05-29 DIAGNOSIS — R188 Other ascites: Secondary | ICD-10-CM

## 2019-05-29 DIAGNOSIS — E0580 Other thyrotoxicosis without thyrotoxic crisis or storm: Secondary | ICD-10-CM

## 2019-05-29 DIAGNOSIS — E039 Hypothyroidism, unspecified: Secondary | ICD-10-CM | POA: Diagnosis not present

## 2019-05-29 DIAGNOSIS — K3533 Acute appendicitis with perforation and localized peritonitis, with abscess: Principal | ICD-10-CM

## 2019-05-29 DIAGNOSIS — Z79890 Hormone replacement therapy (postmenopausal): Secondary | ICD-10-CM | POA: Diagnosis not present

## 2019-05-29 DIAGNOSIS — M1990 Unspecified osteoarthritis, unspecified site: Secondary | ICD-10-CM | POA: Diagnosis present

## 2019-05-29 LAB — COMPREHENSIVE METABOLIC PANEL
ALT: 18 U/L (ref 0–44)
AST: 23 U/L (ref 15–41)
Albumin: 2.6 g/dL — ABNORMAL LOW (ref 3.5–5.0)
Alkaline Phosphatase: 74 U/L (ref 38–126)
Anion gap: 12 (ref 5–15)
BUN: 22 mg/dL (ref 8–23)
CO2: 32 mmol/L (ref 22–32)
Calcium: 8.9 mg/dL (ref 8.9–10.3)
Chloride: 92 mmol/L — ABNORMAL LOW (ref 98–111)
Creatinine, Ser: 1.11 mg/dL — ABNORMAL HIGH (ref 0.44–1.00)
GFR calc Af Amer: 50 mL/min — ABNORMAL LOW (ref >60)
GFR calc non Af Amer: 43 mL/min — ABNORMAL LOW (ref >60)
Glucose, Bld: 117 mg/dL — ABNORMAL HIGH (ref 70–99)
Potassium: 3.5 mmol/L (ref 3.5–5.1)
Sodium: 136 mmol/L (ref 135–145)
Total Bilirubin: 1 mg/dL (ref 0.3–1.2)
Total Protein: 7.3 g/dL (ref 6.5–8.1)

## 2019-05-29 LAB — CBC
HCT: 42.9 % (ref 36.0–46.0)
Hemoglobin: 13.7 g/dL (ref 12.0–15.0)
MCH: 31.1 pg (ref 26.0–34.0)
MCHC: 31.9 g/dL (ref 30.0–36.0)
MCV: 97.3 fL (ref 80.0–100.0)
Platelets: 271 10*3/uL (ref 150–400)
RBC: 4.41 MIL/uL (ref 3.87–5.11)
RDW: 13.7 % (ref 11.5–15.5)
WBC: 15.2 10*3/uL — ABNORMAL HIGH (ref 4.0–10.5)
nRBC: 0 % (ref 0.0–0.2)

## 2019-05-29 LAB — URINALYSIS, ROUTINE W REFLEX MICROSCOPIC
Bilirubin Urine: NEGATIVE
Glucose, UA: NEGATIVE mg/dL
Hgb urine dipstick: NEGATIVE
Ketones, ur: NEGATIVE mg/dL
Leukocytes,Ua: NEGATIVE
Nitrite: NEGATIVE
Protein, ur: NEGATIVE mg/dL
Specific Gravity, Urine: 1.013 (ref 1.005–1.030)
pH: 5 (ref 5.0–8.0)

## 2019-05-29 LAB — POCT CBC
Granulocyte percent: 84.9 %G — AB (ref 37–80)
HCT, POC: 41.2 % — AB (ref 29–41)
Hemoglobin: 13.8 g/dL (ref 11–14.6)
Lymph, poc: 1.6 (ref 0.6–3.4)
MCH, POC: 31.1 pg (ref 27–31.2)
MCHC: 33.5 g/dL (ref 31.8–35.4)
MCV: 92.7 fL (ref 76–111)
MID (cbc): 0.7 (ref 0–0.9)
MPV: 8.8 fL (ref 0–99.8)
POC Granulocyte: 13.4 — AB (ref 2–6.9)
POC LYMPH PERCENT: 10.4 %L (ref 10–50)
POC MID %: 4.7 %M (ref 0–12)
Platelet Count, POC: 265 10*3/uL (ref 142–424)
RBC: 4.45 M/uL (ref 4.04–5.48)
RDW, POC: 14.5 %
WBC: 15.8 10*3/uL — AB (ref 4.6–10.2)

## 2019-05-29 LAB — APTT: aPTT: 32 seconds (ref 24–36)

## 2019-05-29 LAB — PROTIME-INR
INR: 1.5 — ABNORMAL HIGH (ref 0.8–1.2)
Prothrombin Time: 17.8 seconds — ABNORMAL HIGH (ref 11.4–15.2)

## 2019-05-29 LAB — BRAIN NATRIURETIC PEPTIDE: B Natriuretic Peptide: 445.7 pg/mL — ABNORMAL HIGH (ref 0.0–100.0)

## 2019-05-29 LAB — RESPIRATORY PANEL BY RT PCR (FLU A&B, COVID)
Influenza A by PCR: NEGATIVE
Influenza B by PCR: NEGATIVE
SARS Coronavirus 2 by RT PCR: NEGATIVE

## 2019-05-29 LAB — POC SARS CORONAVIRUS 2 AG -  ED: SARS Coronavirus 2 Ag: NEGATIVE

## 2019-05-29 LAB — LIPASE, BLOOD: Lipase: 23 U/L (ref 11–51)

## 2019-05-29 MED ORDER — THYROID 120 MG PO TABS: 120.0000 mg | ORAL_TABLET | Freq: Every day | ORAL | Status: DC

## 2019-05-29 MED ORDER — METOPROLOL SUCCINATE ER 25 MG PO TB24
25.0000 mg | ORAL_TABLET | Freq: Every day | ORAL | Status: DC
  Administered 2019-05-30 – 2019-06-02 (×4): 25 mg via ORAL
  Filled 2019-05-29 (×4): qty 1

## 2019-05-29 MED ORDER — THYROID 60 MG PO TABS: ORAL_TABLET | ORAL | 1 refills | Status: DC

## 2019-05-29 MED ORDER — PIPERACILLIN-TAZOBACTAM 3.375 G IVPB 30 MIN: 3.3750 g | Freq: Three times a day (TID) | INTRAVENOUS | Status: DC

## 2019-05-29 MED ORDER — WARFARIN SODIUM 2.5 MG PO TABS: 2.5000 mg | ORAL_TABLET | Freq: Every day | ORAL | Status: DC

## 2019-05-29 MED ORDER — WARFARIN SODIUM 5 MG PO TABS: 5.0000 mg | ORAL_TABLET | Freq: Every day | ORAL | Status: DC

## 2019-05-29 MED ORDER — IOHEXOL 300 MG/ML  SOLN
75.0000 mL | Freq: Once | INTRAMUSCULAR | Status: AC | PRN
  Administered 2019-05-29: 75 mL via INTRAVENOUS

## 2019-05-29 MED ORDER — ACETAMINOPHEN 325 MG PO TABS: 650.0000 mg | ORAL_TABLET | Freq: Four times a day (QID) | ORAL | Status: DC | PRN

## 2019-05-29 MED ORDER — METOPROLOL SUCCINATE ER 50 MG PO TB24
50.0000 mg | ORAL_TABLET | Freq: Every day | ORAL | Status: DC
  Administered 2019-05-30 – 2019-06-01 (×3): 50 mg via ORAL
  Filled 2019-05-29 (×3): qty 1

## 2019-05-29 MED ORDER — PIPERACILLIN-TAZOBACTAM 3.375 G IVPB 30 MIN
3.3750 g | Freq: Once | INTRAVENOUS | Status: AC
  Administered 2019-05-29: 3.375 g via INTRAVENOUS
  Filled 2019-05-29: qty 50

## 2019-05-29 MED ORDER — ACETAMINOPHEN 650 MG RE SUPP: 650.0000 mg | Freq: Four times a day (QID) | RECTAL | Status: DC | PRN

## 2019-05-29 MED ORDER — PIPERACILLIN-TAZOBACTAM 3.375 G IVPB
3.3750 g | Freq: Three times a day (TID) | INTRAVENOUS | Status: DC
  Administered 2019-05-30 – 2019-06-01 (×8): 3.375 g via INTRAVENOUS
  Filled 2019-05-29 (×8): qty 50

## 2019-05-29 MED ORDER — SODIUM CHLORIDE 0.9% FLUSH
3.0000 mL | Freq: Once | INTRAVENOUS | Status: AC
  Administered 2019-05-29: 3 mL via INTRAVENOUS

## 2019-05-29 MED ORDER — THYROID 60 MG PO TABS
180.0000 mg | ORAL_TABLET | Freq: Every day | ORAL | Status: DC
  Administered 2019-05-30 – 2019-06-02 (×3): 180 mg via ORAL
  Filled 2019-05-29 (×4): qty 1
  Filled 2019-05-29: qty 3

## 2019-05-29 MED ORDER — TORSEMIDE 20 MG PO TABS
20.0000 mg | ORAL_TABLET | Freq: Two times a day (BID) | ORAL | Status: DC
  Administered 2019-05-29 – 2019-06-02 (×8): 20 mg via ORAL
  Filled 2019-05-29 (×8): qty 1

## 2019-05-29 MED ORDER — DILTIAZEM HCL ER COATED BEADS 240 MG PO CP24
240.0000 mg | ORAL_CAPSULE | Freq: Two times a day (BID) | ORAL | Status: DC
  Administered 2019-05-29 – 2019-06-02 (×8): 240 mg via ORAL
  Filled 2019-05-29 (×8): qty 1

## 2019-05-29 NOTE — ED Notes (Signed)
ED TO INPATIENT HANDOFF REPORT  ED Nurse Name and Phone #: [phone]  S Name/Age/Gender Ashley Dunn 86 y.o. female Room/Bed: 012C/012C  Code Status   Code Status: DNR  Home/SNF/Other Home Patient oriented to: self Is this baseline? Yes   Triage Complete: Triage complete  Chief Complaint Appendiceal abscess [K35.33]  Triage Note Pt arrives to ED from home with complaints of right lower quadrant abdominal pain x1 month. Patient describes the pain as coming and going, and not constant. Patient sent here to ED for possible appendicitis by PCP.     Allergies No Known Allergies  Level of Care/Admitting Diagnosis ED Disposition    ED Disposition Condition [facility]: [place] [[id number]]  Level of Care: Telemetry Cardiac [103]  Covid Evaluation: Asymptomatic Screening Protocol (No Symptoms)  Diagnosis: Appendiceal abscess [253910]  Admitting Physician: Aldine Contes [[id number]]  Attending Physician: Aldine Contes [phone]  Estimated length of stay: past midnight tomorrow  Certification:: I certify this patient will need inpatient services for at least 2 midnights       B Medical/Surgery History Past Medical History:  Diagnosis Date  . Arthritis   . Atrial fibrillation ([place])   . Cataract   . CHF (congestive heart failure) ([place])   . Hypertension   . Osteoporosis   . Thyroid disease    Past Surgical History:  Procedure Laterality Date  . CESAREAN SECTION    . EYE SURGERY    . FRACTURE SURGERY       A IV Location/Drains/Wounds Patient Lines/Drains/Airways Status   Active Line/Drains/Airways    Name:   Placement date:   Placement time:   Site:   Days:   Peripheral IV 05/29/19 Right;Lateral;Upper Arm   05/29/19    1307    Arm   less than 1          Intake/Output Last 24 hours  Intake/Output Summary (Last 24 hours) at 05/29/2019 1824 Last data filed at 05/29/2019 1624 Gross per 24 hour  Intake 3 ml  Output 250  ml  Net -247 ml    Labs/Imaging Results for orders placed or performed during the hospital encounter of 05/29/19 (from the past 48 hour(s))  Lipase, blood     Status: None   Collection Time: 05/29/19 12:23 PM  Result Value Ref Range   Lipase 23 11 - 51 U/L    Comment: Performed at [facility], [place] [address] [place], [place] [address]  Comprehensive metabolic panel     Status: Abnormal   Collection Time: 05/29/19 12:23 PM  Result Value Ref Range   Sodium 136 135 - 145 mmol/L   Potassium 3.5 3.5 - 5.1 mmol/L   Chloride 92 (L) 98 - 111 mmol/L   CO2 32 22 - 32 mmol/L   Glucose, Bld 117 (H) 70 - 99 mg/dL   BUN 22 8 - 23 mg/dL   Creatinine, Ser 1.11 (H) 0.44 - 1.00 mg/dL   Calcium 8.9 8.9 - 10.3 mg/dL   Total Protein 7.3 6.5 - 8.1 g/dL   Albumin 2.6 (L) 3.5 - 5.0 g/dL   AST 23 15 - 41 U/L   ALT 18 0 - 44 U/L   Alkaline Phosphatase 74 38 - 126 U/L   Total Bilirubin 1.0 0.3 - 1.2 mg/dL   GFR calc non Af Amer 43 (L) >60 mL/min   GFR calc Af Amer 50 (L) >60 mL/min   Anion gap 12 5 - 15  Comment: Performed at [facility], [place] [address], [place], [place] [address]  CBC     Status: Abnormal   Collection Time: 05/29/19 12:23 PM  Result Value Ref Range   WBC 15.2 (H) 4.0 - 10.5 K/uL   RBC 4.41 3.87 - 5.11 MIL/uL   Hemoglobin 13.7 12.0 - 15.0 g/dL   HCT 42.9 36.0 - 46.0 %   MCV 97.3 80.0 - 100.0 fL   MCH 31.1 26.0 - 34.0 pg   MCHC 31.9 30.0 - 36.0 g/dL   RDW 13.7 11.5 - 15.5 %   Platelets 271 150 - 400 K/uL   nRBC 0.0 0.0 - 0.2 %    Comment: Performed at [facility], [place] [address], [place], [place] [address]  Brain natriuretic peptide     Status: Abnormal   Collection Time: 05/29/19 12:23 PM  Result Value Ref Range   B Natriuretic Peptide 445.7 (H) 0.0 - 100.0 pg/mL    Comment: Performed at [facility] [address], [place],  [address]  Urinalysis, Routine w reflex microscopic     Status: Abnormal   Collection Time: 05/29/19  1:30 PM   Result Value Ref Range   Color, Urine AMBER (A) YELLOW    Comment: BIOCHEMICALS MAY BE AFFECTED BY COLOR   APPearance HAZY (A) CLEAR   Specific Gravity, Urine 1.013 1.005 - 1.030   pH 5.0 5.0 - 8.0   Glucose, UA NEGATIVE NEGATIVE mg/dL   Hgb urine dipstick NEGATIVE NEGATIVE   Bilirubin Urine NEGATIVE NEGATIVE   Ketones, ur NEGATIVE NEGATIVE mg/dL   Protein, ur NEGATIVE NEGATIVE mg/dL   Nitrite NEGATIVE NEGATIVE   Leukocytes,Ua NEGATIVE NEGATIVE    Comment: Performed at [facility] [address], [place],  [address]  Respiratory Panel by RT PCR (Flu A&B, Covid) - Nasopharyngeal Swab     Status: None   Collection Time: 05/29/19  4:00 PM   Specimen: Nasopharyngeal Swab  Result Value Ref Range   SARS Coronavirus 2 by RT PCR NEGATIVE NEGATIVE    Comment: (NOTE) SARS-CoV-2 target nucleic acids are NOT DETECTED. The SARS-CoV-2 RNA is generally detectable in upper respiratoy specimens during the acute phase of infection. The lowest concentration of SARS-CoV-2 viral copies this assay can detect is 131 copies/mL. A negative result does not preclude SARS-Cov-2 infection and should not be used as the sole basis for treatment or other patient management decisions. A negative result may occur with  improper specimen collection/handling, submission of specimen other than nasopharyngeal swab, presence of viral mutation(s) within the areas targeted by this assay, and inadequate number of viral copies (<131 copies/mL). A negative result must be combined with clinical observations, patient history, and epidemiological information. The expected result is Negative. Fact Sheet for Patients:  PinkCheek.be Fact Sheet for Healthcare Providers:  GravelBags.it This test is not yet ap proved or cleared by the [place] FDA and  has been authorized for detection and/or diagnosis of SARS-CoV-2 by FDA under an Emergency Use  Authorization (EUA). This EUA will remain  in effect (meaning this test can be used) for the duration of the COVID-19 declaration under Section 564(b)(1) of the Act, 21 U.S.C. section 360bbb-3(b)(1), unless the authorization is terminated or revoked sooner.    Influenza A by PCR NEGATIVE NEGATIVE   Influenza B by PCR NEGATIVE NEGATIVE    Comment: (NOTE) The Xpert Xpress SARS-CoV-2/FLU/RSV assay is intended as an aid in  the diagnosis of influenza from Nasopharyngeal  swab specimens and  should not be used as a sole basis for treatment. Nasal washings and  aspirates are unacceptable for Xpert Xpress SARS-CoV-2/FLU/RSV  testing. Fact Sheet for Patients: PinkCheek.be Fact Sheet for Healthcare Providers: GravelBags.it This test is not yet approved or cleared by the [place] FDA and  has been authorized for detection and/or diagnosis of SARS-CoV-2 by  FDA under an Emergency Use Authorization (EUA). This EUA will remain  in effect (meaning this test can be used) for the duration of the  Covid-19 declaration under Section 564(b)(1) of the Act, 21  U.S.C. section 360bbb-3(b)(1), unless the authorization is  terminated or revoked. Performed at [facility], [place] [address], [place], [place] [address]   POC SARS Coronavirus 2 Ag-ED - Nasal Swab (BD Veritor Kit)     Status: None   Collection Time: 05/29/19  4:24 PM  Result Value Ref Range   SARS Coronavirus 2 Ag NEGATIVE NEGATIVE    Comment: (NOTE) SARS-CoV-2 antigen NOT DETECTED.  Negative results are presumptive.  Negative results do not preclude SARS-CoV-2 infection and should not be used as the sole basis for treatment or other patient management decisions, including infection  control decisions, particularly in the presence of clinical signs and  symptoms consistent with COVID-19, or in those who have been in contact with the virus.  Negative results must be combined  with clinical observations, patient history, and epidemiological information. The expected result is Negative. Fact Sheet for Patients: PodPark.tn Fact Sheet for Healthcare Providers: GiftContent.is This test is not yet approved or cleared by the [place] FDA and  has been authorized for detection and/or diagnosis of SARS-CoV-2 by FDA under an Emergency Use Authorization (EUA).  This EUA will remain in effect (meaning this test can be used) for the duration of  the COVID-19 de claration under Section 564(b)(1) of the Act, 21 U.S.C. section 360bbb-3(b)(1), unless the authorization is terminated or revoked sooner.   Protime-INR     Status: Abnormal   Collection Time: 05/29/19  5:15 PM  Result Value Ref Range   Prothrombin Time 17.8 (H) 11.4 - 15.2 seconds   INR 1.5 (H) 0.8 - 1.2    Comment: (NOTE) INR goal varies based on device and disease states. Performed at [facility], [place] [address] [place], [place] [address]   APTT     Status: None   Collection Time: 05/29/19  5:15 PM  Result Value Ref Range   aPTT 32 24 - 36 seconds    Comment: Performed at [facility] [address], [place], [place] [address]   CT ABDOMEN PELVIS W CONTRAST  Result Date: 05/29/2019 CLINICAL DATA:  Right lower quadrant pain. EXAM: CT ABDOMEN AND PELVIS WITH CONTRAST TECHNIQUE: Multidetector CT imaging of the abdomen and pelvis was performed using the standard protocol following bolus administration of intravenous contrast. CONTRAST:  60m OMNIPAQUE IOHEXOL 300 MG/ML  SOLN COMPARISON:  No comparison studies available. FINDINGS: Lower chest: 2.6 cm nodular opacity identified posterior right lower lobe on image 10/series 4. Hepatobiliary: No suspicious focal abnormality within the liver parenchyma. There is no evidence for gallstones, gallbladder wall thickening, or pericholecystic fluid. No intrahepatic or extrahepatic biliary dilation.  Pancreas: No focal mass lesion. No dilatation of the main duct. No intraparenchymal cyst. No peripancreatic edema. Spleen: No splenomegaly. No focal mass lesion. Adrenals/Urinary Tract: Left adrenal thickening noted. Right adrenal gland unremarkable. 3.7 cm cystic lesion noted upper pole right kidney with potential septation or  nodule posteriorly (26/2). Small exophytic cyst noted lower pole right kidney. Several cysts in the left kidney measure up to 3.4 cm. No evidence for hydroureter. The urinary bladder appears normal for the degree of distention. Stomach/Bowel: Stomach is unremarkable. No gastric wall thickening. No evidence of outlet obstruction. Duodenum is normally positioned as is the ligament of Treitz. No small bowel wall thickening. No small bowel dilatation. The terminal ileum is normal. Appendix is mildly dilated and fluid-filled measuring up to 19 mm diameter. This tracks posterior to the cecum and then lateral to the right abdominal wall where a complex multi septated 6.5 x 4.7 x 5.0 cm mass is identified. While this lesion does have an intraperitoneal component, it dissects through the abdominal wall into the dee p lateral abdominal wall muscles just anterior to the iliac crest. Diverticular changes are noted in the left colon without diverticulitis. Vascular/Lymphatic: There is abdominal aortic atherosclerosis without aneurysm. There is no gastrohepatic or hepatoduodenal ligament lymphadenopathy. Small lymph nodes are seen in the right lower quadrant around the large complex cystic process and there is a low-density mildly enlarged lymph node in the central ileocolic mesentery measuring up to 12 mm in short axis diameter (image 45/series 2). No pelvic sidewall lymphadenopathy. Reproductive: The uterus is unremarkable.  There is no adnexal mass. Other: No intraperitoneal free fluid. Musculoskeletal: Bones are diffusely demineralized. IMPRESSION: 1. Markedly dilated thick-walled appendix tracks  laterally to the lateral abdominal wall where a 7 x 5 x 5 cm complex multi septated rim enhancing collection is identified. This is most likely a complex abscess secondary to appendiceal rupture that is dissected into the abdominal wall. Less likely but not entirely excluded, necrotic neoplasm from primary appendiceal adenocarcinoma would be a possibility. Low-density mildly enlarged ileocolic node associated. No intraperitoneal free fluid. 2. Bilateral renal cysts including a 3.7 cm cyst in the upper pole right kidney with potential septation or posterior mural nodule. Nonemergent follow-up MRI in 3 months could be used to assess stability in further characterize. 3. 2.6 cm nodular opacity posterior right lower lobe may be scarring/rounded atelectasis, but close follow-up recommended to ensure stability as neoplasm cannot be excluded. 4.  Aortic Atherosclerois (ICD10-170.0) Electronically Signed   By: Misty Stanley M.D.   On: 05/29/2019 15:17   DG Chest Portable 1 View  Result Date: 05/29/2019 CLINICAL DATA:  Right lower quadrant pain for 1 month.  Hypoxia. EXAM: PORTABLE CHEST 1 VIEW COMPARISON:  None. FINDINGS: Patient rotated right. Apical lordotic positioning. Moderate cardiomegaly. Atherosclerosis in the transverse aorta. Right paratracheal soft tissue fullness is likely due to prominent great vessels. Possible tiny left pleural effusion. Pulmonary interstitial prominence. Mild subsegmental atelectasis at the bases. IMPRESSION: Cardiomegaly with mild pulmonary interstitial prominence which could be chronic or represent mild pulmonary venous congestion. Possible tiny left pleural effusion with bibasilar subsegmental atelectasis. Aortic Atherosclerosis (ICD10-I70.0). Electronically Signed   By: Abigail Miyamoto M.D.   On: 05/29/2019 14:47    Pending Labs Unresulted Labs (From admission, onward)    Start     Ordered   05/30/19 0500  Protime-INR  Tomorrow morning,   R     05/29/19 1617   05/30/19 8657   Basic metabolic panel  Tomorrow morning,   R     05/29/19 1647   05/30/19 0500  CBC  Tomorrow morning,   R     05/29/19 1647   05/29/19 1326  Urine culture  ONCE - STAT,   STAT    Question:  Patient immune  status  Answer:  Normal   05/29/19 1325          Vitals/Pain Today's Vitals   05/29/19 1305 05/29/19 1400 05/29/19 1430 05/29/19 1445  BP: 121/85 128/60 125/68 (!) 138/58  Pulse: (!) 36 (!) 34 (!) 122 89  Resp: (!) '21 20 20 ' (!) 21  Temp:      TempSrc:      SpO2: 93% 93% 97% 98%  PainSc:        Isolation Precautions No active isolations  Medications Medications  piperacillin-tazobactam (ZOSYN) IVPB 3.375 g (has no administration in time range)  diltiazem (CARDIZEM CD) 24 hr capsule 240 mg (has no administration in time range)  metoprolol succinate (TOPROL-XL) 24 hr tablet 25 mg (has no administration in time range)  torsemide (DEMADEX) tablet 20 mg (has no administration in time range)  acetaminophen (TYLENOL) tablet 650 mg (has no administration in time range)    Or  acetaminophen (TYLENOL) suppository 650 mg (has no administration in time range)  metoprolol succinate (TOPROL-XL) 24 hr tablet 50 mg (has no administration in time range)  thyroid (ARMOUR) tablet 180 mg (has no administration in time range)  sodium chloride flush (NS) 0.9 % injection 3 mL (3 mLs Intravenous Push 05/29/19 1324)  iohexol (OMNIPAQUE) 300 MG/ML solution 75 mL (75 mLs Intravenous Contrast Given 05/29/19 1458)  piperacillin-tazobactam (ZOSYN) IVPB 3.375 g (0 g Intravenous Stopped 05/29/19 1624)    Mobility walks with device Low fall risk   Focused Assessments Abscess   R Recommendations: See Admitting Provider Note  Report given to:   Additional Notes: abdominal abscess

## 2019-05-29 NOTE — ED Triage Notes (Signed)
Pt arrives to ED from home with complaints of right lower quadrant abdominal pain x1 month. Patient describes the pain as coming and going, and not constant. Patient sent here to ED for possible appendicitis by PCP.

## 2019-05-29 NOTE — H&P (Signed)
Date: 05/29/2019               Patient Name:  Ashley Dunn MRN: [id number]  DOB: [birth year] Age / Sex: 86 y.o., female   PCP: Forrest Moron, MD         Medical Service: Internal Medicine Teaching Service         Attending Physician: Dr. Aldine Contes, MD    First Contact: Sherry Ruffing, MD, [place] Pager: [place] [place] [phone])  Second Contact: Marva Panda, MD, Person Pager: [place] [phone])       After Hours (After 5p/  First Contact Pager: [phone]  weekends / holidays): Second Contact Pager: [phone]   Chief Complaint: abdominal pain  History of Present Illness: 86 y.o. yo female w/ PMH significant for persistent atrial fibrillation on warfarin, hypertension, congestive heart failure and hypothyroidism presenting with one month duration of right lower abdominal pain. She notes that this has been intermittent in nature and is a dull pain that is relieved with exercise. She has history of indigestion and notes that every once in a while she feels mild epigastric pain that is relieved on its own.  She denies any fevers, chills, nausea/vomiting, diarrhea, constipation, chest pain or shortness of breath. She had an appointment with her PCP this morning  recommended patient to present to ED for CT scan due to concerns for appendicitis.   In the ED, patient with mild tachycardia but afebrile and BP wnl. CT scan in the ED shows large complex abscess secondary to appendiceal rupture but unable to exclude necrotic neoplasm from primary appendiceal adenocarcinoma given mildly enlarged ileocolic nodes. Also noted to have bilateral renal cysts and a nodular opacity posterior right lower lobe of lung. Also noted to have WBC 15.8>15.2.   Meds:  Diltiazem 240mg  bid Metoprolol 25mg  qd Metoprolol 50mg  qHS Armour thyroid 180mg  qd before breakfast  Torsemide 20mg  bid Warfarin 5mg  TThSat, 2.5mg  MWFSun   Allergies: Allergies as of 05/29/2019  . (No Known Allergies)   Past Medical History:  Diagnosis Date    . Arthritis   . Atrial fibrillation ([place])   . Cataract   . CHF (congestive heart failure) ([place])   . Hypertension   . Osteoporosis   . Thyroid disease     Family History:  Family History  Problem Relation Age of Onset  . Stroke Mother   . Cancer Father   . Hyperlipidemia Brother   . Healthy Daughter   . Healthy Son   . Heart disease Maternal Grandmother   . Heart disease Maternal Grandfather      Social History:  Social History   Tobacco Use  . Smoking status: Never Smoker  . Smokeless tobacco: Never Used  Substance Use Topics  . Alcohol use: Never  . Drug use: Never  Patient lives at home with her daughter and uses rolling walker to ambulate. She is independent in her ADL's. She recently moved from [place] in summer 2020. She denies any smoking history, alcohol use or illicit drug use.   Review of Systems: A complete ROS was negative except as per HPI.   Physical Exam: Blood pressure (!) 138/58, pulse 89, temperature 98.6 F (37 C), temperature source Oral, resp. rate (!) 21, SpO2 98 %. Physical Exam Constitutional:      General: She is not in acute distress.    Appearance: She is well-developed. She is not ill-appearing.  HENT:     Head: Normocephalic and atraumatic.     Mouth/Throat:  Mouth: Mucous membranes are moist.     Pharynx: Oropharynx is clear.  Eyes:     Extraocular Movements: Extraocular movements intact.  Cardiovascular:     Rate and Rhythm: Tachycardia present. Rhythm irregular.     Heart sounds: Normal heart sounds. No murmur. No gallop.   Pulmonary:     Effort: Pulmonary effort is normal. No respiratory distress.     Breath sounds: Normal breath sounds. No wheezing or rales.  Abdominal:     General: Bowel sounds are normal. There is no distension.     Palpations: Abdomen is soft.     Tenderness: There is abdominal tenderness in the right lower quadrant.  Skin:    General: Skin is warm and dry.     Capillary Refill: Capillary refill  takes less than 2 seconds.  Neurological:     General: No focal deficit present.     Mental Status: She is alert and oriented to person, place, and time.    EKG: pending  CXR:  Cardiomegaly with mild pulmonary interstitial prominence which could be chronic or represent mild pulmonary venous congestion. Possible tiny left pleural effusion with bibasilar subsegmental atelectasis.  CT ABDOMEN PELVIS W CONTRAST:  IMPRESSION: 1. Markedly dilated thick-walled appendix tracks laterally to the lateral abdominal wall where a 7 x 5 x 5 cm complex multi septated rim enhancing collection is identified. This is most likely a complex abscess secondary to appendiceal rupture that is dissected into the abdominal wall. Less likely but not entirely excluded, necrotic neoplasm from primary appendiceal adenocarcinoma would be a possibility. Low-density mildly enlarged ileocolic node associated. No intraperitoneal free fluid. 2. Bilateral renal cysts including a 3.7 cm cyst in the upper pole right kidney with potential septation or posterior mural nodule. Nonemergent follow-up MRI in 3 months could be used to assess stability in further characterize. 3. 2.6 cm nodular opacity posterior right lower lobe may be scarring/rounded atelectasis, but close follow-up recommended to ensure stability as neoplasm cannot be excluded.  Assessment & Plan by Problem: Ms. Aireana Ryland is a 86year old community dwelling female with PMHx of atrial fibrillation on warfarin, hypertension, hypothyroidism, and congestive heart failure presenting with one month history of intermittent RLQ abdominal pain and imaging concerning for complex abscess secondary to appendiceal rupture into abdominal wall vs necrotic neoplasm from primary appendiceal adenocarcinoma.   Perforated appendicitis with large abscess  Possible necrotic neoplasm from primary appendiceal adenocarcinoma:  Patient with one month history of intermittent RLQ  abdominal pain that is dull in nature. She was noted to have RLQ tenderness at McBurney's point and referred to ED for concerns of appendicitis. Noted to have WBC 15 on presentation. CT concerning for appendiceal rupture with complex abscess 7x5x5cm in lateral abdominal wall vs necrotic neoplasm from primary appendiceal adenocarcinoma given mildly enlarged ileocolic node. Patient evaluated by surgery. No need for emergent surgery at this time. Recommended for IR guided aspiration vs drain placement.  - IV zosyn 3.375g q8h - IR guided aspiration/drain placement in AM  - CBC daily   Persistent atrial fibrillation: Patient with history of A.fib on warfarin w/CHADSVASC2 5 with w/ 7.2% stroke risk. She is rate controlled on diltiazem 240mg  bid and metoprolol 75mg  daily. Patient currently rate controlled. Plan: - Continue diltiazem 240mg  bid - Continue metoprolol 25mg  qd + 50mg  qHS - Holding warfarin for goal INR <1.5 for possible IR guided drainage in AM - PT/INR in AM - Cardiac monitoring   Hypothyroidism: - Continue Armour thyroid 180mg  qd  CHF:  Patient with history of CHF (unclear whether diastolic or systolic) without recent Echo. She is on metoprolol and torsemide at home. BNP 445. However, patient denies any chest pain, shortness of breath, or increased lower extremity swelling. Appears euvolemic on examination.  Plan:  - Metoprolol 25mg  qd + 50mg  qHS - Torsemide 20mg  bid  Hypertension: - Continue home medications as above  FEN/GI:  Diet: Heart healthy, NPO after midnight Fluids: None  VTE Prophylaxis: SCD's  Holding warfarin for possible IR guided aspiration/drain placement in AM if INR <1.5  Code status: DNR/DNI  Dispo: Admit patient to Inpatient with expected length of stay greater than 2 midnights.  Signed: Harvie Heck, MD  Internal Medicine, PGY-1 Pager: [phone] 05/29/2019, 6:17 PM

## 2019-05-29 NOTE — Progress Notes (Signed)
IR requested by Dr. Regenia Skeeter for management of RLQ intra-abdominal fluid collection.  CT abdomen/pelvis reviewed by Dr. Pascal Lux who has approved patient for aspiration/possible drain placement. Patient currently on Coumadin, INR 05/14/2019 2.3. Plan for possible procedure tomorrow in IR if INR <1.5- please hold Coumadin tonight. Will recheck INR in AM. Patient will be NPO at midnight. Dr. Regenia Skeeter aware. Will consent patient tomorrow prior to procedure.  Please call IR with questions/concerns.   Bea Graff Apolo Cutshaw, PA-C 05/29/2019, 4:40 PM

## 2019-05-29 NOTE — ED Notes (Signed)
Pt. Ha sustained spo2 of 78%, with a good pleth wave.  Put pt on 2L [place], pt doesn't wear O2 for any reason.  Sats increased to 100% currently.

## 2019-05-29 NOTE — ED Provider Notes (Addendum)
[facility] EMERGENCY DEPARTMENT Provider Note   CSN: [id number] Arrival date & time: 05/29/19  1156     History Chief Complaint  Patient presents with  . Abdominal Pain    Ashley Dunn is a 86 y.o. female.  HPI 86 year old female with on and off right lower quadrant abdominal pain for a month or so.  However much worse over the last couple days.  Currently while talking to her she has no pain.  No fever, vomiting.  Has chronic back pain but no new back pain.  No urinary symptoms.  Sent over from PCP office for CT scan.   Past Medical History:  Diagnosis Date  . Arthritis   . Atrial fibrillation ([place])   . Cataract   . CHF (congestive heart failure) ([place])   . Hypertension   . Osteoporosis   . Thyroid disease     Patient Active Problem List   Diagnosis Date Noted  . Atrial fibrillation ([place]) 03/19/2019  . Persistent atrial fibrillation ([place]) 02/26/2019    Past Surgical History:  Procedure Laterality Date  . CESAREAN SECTION    . EYE SURGERY    . FRACTURE SURGERY       OB History   No obstetric history on file.     Family History  Problem Relation Age of Onset  . Stroke Mother   . Cancer Father   . Hyperlipidemia Brother   . Healthy Daughter   . Healthy Son   . Heart disease Maternal Grandmother   . Heart disease Maternal Grandfather     Social History   Tobacco Use  . Smoking status: Never Smoker  . Smokeless tobacco: Never Used  Substance Use Topics  . Alcohol use: Never  . Drug use: Never    Home Medications Prior to Admission medications   Medication Sig Start Date End Date Taking? Authorizing Provider  diltiazem (CARDIZEM CD) 240 MG 24 hr capsule Take 240 mg by mouth 2 (two) times daily.  12/28/18   [provider]  metoprolol succinate (TOPROL-XL) 25 MG 24 hr tablet Take 1 tablet (25 mg total) by mouth daily. 05/14/19   Miquel Dunn, NP  metoprolol succinate (TOPROL-XL) 50 MG 24 hr tablet Take 50 mg by mouth  daily.  01/21/19   [provider]  thyroid (ARMOUR THYROID) 120 MG tablet Take 1 tablet (120 mg total) by mouth daily before breakfast. Patient taking differently: Take 180 mg by mouth daily before breakfast.  05/14/19   Forrest Moron, MD  thyroid (ARMOUR THYROID) 60 MG tablet Take 60mg  by mouth daily and add to the 120mg  05/29/19   Delia Chimes A, MD  torsemide (DEMADEX) 20 MG tablet Take 20 mg by mouth 2 (two) times daily.  01/15/19   [provider]  warfarin (COUMADIN) 2.5 MG tablet Take 2.5 mg by mouth daily. Monday, Wednesday , Friday  AND SATURDAY    [provider]  warfarin (COUMADIN) 5 MG tablet Take 5 mg by mouth daily. TAKES 5 MG Tuesday, Thursday AND SATURDAY 12/28/18   [provider]    Allergies    Patient has no known allergies.  Review of Systems   Review of Systems  Constitutional: Negative for fever.  Respiratory: Negative for shortness of breath.   Cardiovascular: Negative for chest pain.  Gastrointestinal: Positive for abdominal pain. Negative for vomiting.  Genitourinary: Negative for dysuria.  Musculoskeletal: Negative for back pain.  All other systems reviewed and are negative.   Physical  Exam Updated Vital Signs BP (!) 138/58   Pulse 89   Temp 98.6 F (37 C) (Oral)   Resp (!) 21   SpO2 98%   Physical Exam Vitals and nursing note reviewed.  Constitutional:      General: She is not in acute distress.    Appearance: She is well-developed. She is obese. She is not ill-appearing or diaphoretic.  HENT:     Head: Normocephalic and atraumatic.     Right Ear: External ear normal.     Left Ear: External ear normal.     Nose: Nose normal.  Eyes:     General:        Right eye: No discharge.        Left eye: No discharge.  Cardiovascular:     Rate and Rhythm: Normal rate and regular rhythm.     Heart sounds: Normal heart sounds.  Pulmonary:     Effort: Pulmonary effort is normal.     Breath sounds: Normal breath  sounds.  Abdominal:     Palpations: Abdomen is soft.     Tenderness: There is abdominal tenderness in the right lower quadrant. There is no right CVA tenderness or left CVA tenderness.  Skin:    General: Skin is warm and dry.  Neurological:     Mental Status: She is alert.  Psychiatric:        Mood and Affect: Mood is not anxious.     ED Results / Procedures / Treatments   Labs (all labs ordered are listed, but only abnormal results are displayed) Labs Reviewed  COMPREHENSIVE METABOLIC PANEL - Abnormal; Notable for the following components:      Result Value   Chloride 92 (*)    Glucose, Bld 117 (*)    Creatinine, Ser 1.11 (*)    Albumin 2.6 (*)    GFR calc non Af Amer 43 (*)    GFR calc Af Amer 50 (*)    All other components within normal limits  CBC - Abnormal; Notable for the following components:   WBC 15.2 (*)    All other components within normal limits  URINALYSIS, ROUTINE W REFLEX MICROSCOPIC - Abnormal; Notable for the following components:   Color, Urine AMBER (*)    APPearance HAZY (*)    All other components within normal limits  BRAIN NATRIURETIC PEPTIDE - Abnormal; Notable for the following components:   B Natriuretic Peptide 445.7 (*)    All other components within normal limits  URINE CULTURE  RESPIRATORY PANEL BY RT PCR (FLU A&B, COVID)  LIPASE, BLOOD  PROTIME-INR  APTT  POC SARS CORONAVIRUS 2 AG -  ED    EKG None  Radiology CT ABDOMEN PELVIS W CONTRAST  Result Date: 05/29/2019 CLINICAL DATA:  Right lower quadrant pain. EXAM: CT ABDOMEN AND PELVIS WITH CONTRAST TECHNIQUE: Multidetector CT imaging of the abdomen and pelvis was performed using the standard protocol following bolus administration of intravenous contrast. CONTRAST:  29mL OMNIPAQUE IOHEXOL 300 MG/ML  SOLN COMPARISON:  No comparison studies available. FINDINGS: Lower chest: 2.6 cm nodular opacity identified posterior right lower lobe on image 10/series 4. Hepatobiliary: No suspicious focal  abnormality within the liver parenchyma. There is no evidence for gallstones, gallbladder wall thickening, or pericholecystic fluid. No intrahepatic or extrahepatic biliary dilation. Pancreas: No focal mass lesion. No dilatation of the main duct. No intraparenchymal cyst. No peripancreatic edema. Spleen: No splenomegaly. No focal mass lesion. Adrenals/Urinary Tract: Left adrenal thickening noted. Right adrenal gland unremarkable.  3.7 cm cystic lesion noted upper pole right kidney with potential septation or nodule posteriorly (26/2). Small exophytic cyst noted lower pole right kidney. Several cysts in the left kidney measure up to 3.4 cm. No evidence for hydroureter. The urinary bladder appears normal for the degree of distention. Stomach/Bowel: Stomach is unremarkable. No gastric wall thickening. No evidence of outlet obstruction. Duodenum is normally positioned as is the ligament of Treitz. No small bowel wall thickening. No small bowel dilatation. The terminal ileum is normal. Appendix is mildly dilated and fluid-filled measuring up to 19 mm diameter. This tracks posterior to the cecum and then lateral to the right abdominal wall where a complex multi septated 6.5 x 4.7 x 5.0 cm mass is identified. While this lesion does have an intraperitoneal component, it dissects through the abdominal wall into the dee p lateral abdominal wall muscles just anterior to the iliac crest. Diverticular changes are noted in the left colon without diverticulitis. Vascular/Lymphatic: There is abdominal aortic atherosclerosis without aneurysm. There is no gastrohepatic or hepatoduodenal ligament lymphadenopathy. Small lymph nodes are seen in the right lower quadrant around the large complex cystic process and there is a low-density mildly enlarged lymph node in the central ileocolic mesentery measuring up to 12 mm in short axis diameter (image 45/series 2). No pelvic sidewall lymphadenopathy. Reproductive: The uterus is unremarkable.   There is no adnexal mass. Other: No intraperitoneal free fluid. Musculoskeletal: Bones are diffusely demineralized. IMPRESSION: 1. Markedly dilated thick-walled appendix tracks laterally to the lateral abdominal wall where a 7 x 5 x 5 cm complex multi septated rim enhancing collection is identified. This is most likely a complex abscess secondary to appendiceal rupture that is dissected into the abdominal wall. Less likely but not entirely excluded, necrotic neoplasm from primary appendiceal adenocarcinoma would be a possibility. Low-density mildly enlarged ileocolic node associated. No intraperitoneal free fluid. 2. Bilateral renal cysts including a 3.7 cm cyst in the upper pole right kidney with potential septation or posterior mural nodule. Nonemergent follow-up MRI in 3 months could be used to assess stability in further characterize. 3. 2.6 cm nodular opacity posterior right lower lobe may be scarring/rounded atelectasis, but close follow-up recommended to ensure stability as neoplasm cannot be excluded. 4.  Aortic Atherosclerois (ICD10-170.0) Electronically Signed   By: Misty Stanley M.D.   On: 05/29/2019 15:17   DG Chest Portable 1 View  Result Date: 05/29/2019 CLINICAL DATA:  Right lower quadrant pain for 1 month.  Hypoxia. EXAM: PORTABLE CHEST 1 VIEW COMPARISON:  None. FINDINGS: Patient rotated right. Apical lordotic positioning. Moderate cardiomegaly. Atherosclerosis in the transverse aorta. Right paratracheal soft tissue fullness is likely due to prominent great vessels. Possible tiny left pleural effusion. Pulmonary interstitial prominence. Mild subsegmental atelectasis at the bases. IMPRESSION: Cardiomegaly with mild pulmonary interstitial prominence which could be chronic or represent mild pulmonary venous congestion. Possible tiny left pleural effusion with bibasilar subsegmental atelectasis. Aortic Atherosclerosis (ICD10-I70.0). Electronically Signed   By: Abigail Miyamoto M.D.   On: 05/29/2019 14:47      Procedures Procedures (including critical care time)  Medications Ordered in ED Medications  piperacillin-tazobactam (ZOSYN) IVPB 3.375 g (3.375 g Intravenous New Bag/Given 05/29/19 1556)  piperacillin-tazobactam (ZOSYN) IVPB 3.375 g (has no administration in time range)  sodium chloride flush (NS) 0.9 % injection 3 mL (3 mLs Intravenous Push 05/29/19 1324)  iohexol (OMNIPAQUE) 300 MG/ML solution 75 mL (75 mLs Intravenous Contrast Given 05/29/19 1458)    ED Course  I have reviewed the triage  vital signs and the nursing notes.  Pertinent labs & imaging results that were available during my care of the patient were reviewed by me and considered in my medical decision making (see chart for details).    MDM Rules/Calculators/A&P                      CT confirms appendicitis but also shows fairly large abscess.  General surgery consulted and recommends IR management and they will follow.  Given IV Zosyn.  Internal medicine teaching service consulted for admission.  Of note, is on coumadin, INR pending. Depending on level may need Vit K and holding coumadin tonight per IR PA. Final Clinical Impression(s) / ED Diagnoses Final diagnoses:  Appendicitis with abscess    Rx / DC Orders ED Discharge Orders    None       Sherwood Gambler, MD 05/29/19 1622    Sherwood Gambler, MD 05/29/19 [phone]

## 2019-05-29 NOTE — Patient Instructions (Addendum)
   1. Go to [facility] 2. Virtual visit follow up after ER 3. Continue to take the Armour Thyroid 180mg  and will recheck in April.  If levels are out of range at that time will refer to Endocrinology   If you have lab work done today you will be contacted with your lab results within the next 2 weeks.  If you have not heard from [place] then please contact us. The fastest way to get your results is to register for My Chart.   IF you received an x-ray today, you will receive an invoice from [facility]. Please contact [facility] at [phone] with questions or concerns regarding your invoice.   IF you received labwork today, you will receive an invoice from [facility]. Please contact [facility] at [phone] with questions or concerns regarding your invoice.   Our billing staff will not be able to assist you with questions regarding bills from these companies.  You will be contacted with the lab results as soon as they are available. The fastest way to get your results is to activate your My Chart account. Instructions are located on the last page of this paperwork. If you have not heard from [place] regarding the results in 2 weeks, please contact this office.

## 2019-05-29 NOTE — Consult Note (Signed)
[facility] Surgery Consult Note  Ashley Dunn 05-May-1925  [id number].    Requesting MD: Sherwood Gambler Chief Complaint/Reason for Consult: appendicitis with abscess  HPI:  Ashley Dunn is a 86yo female PMH permanent atrial fibrillation/flutter on coumadin (last dose 2/2 PM), CHF, HTN, and hypothyroidism who presented to [facility] earlier today from her PCP's office complaining of abdominal pain. States that the pain started about 1 month ago, but has been worse over the last 2 days. It is mostly in her RLQ. The pain is intermittent and improves with exercise. Denies nausea, vomiting, fever, chills, diarrhea. She reports last BM was this AM and was formed, non-bloody.   In the ED she was found to be afebrile. Lab work pertinent for WBC 15.2, creatinine 1.11, albumin 2.6, BNP 445.7. Covid test pending. CT scan obtained and shows a markedly dilated thick-walled appendix tracking laterally to the lateral abdominal wall where a 7 x 5 x 5 cm complex multi septated rim enhancing collection is identified, likely an abscess secondary to appendiceal rupture or less likely necrotic neoplasm.  General surgery asked to see.  Abdominal surgical history: c section, ectopic  Nonsmoker, denies alcohol and illicit drug use as well.  Lives with her daughter NKDA  Review of Systems  Constitutional: Negative for chills and fever.  Respiratory: Negative for shortness of breath.   Cardiovascular: Positive for leg swelling (chronic ). Negative for chest pain.  Gastrointestinal: Positive for abdominal pain. Negative for blood in stool, constipation, diarrhea, melena, nausea and vomiting.  Genitourinary: Negative for dysuria, frequency and urgency.  All other systems reviewed and are negative.   Family History  Problem Relation Age of Onset  . Stroke Mother   . Cancer Father   . Hyperlipidemia Brother   . Healthy Daughter   . Healthy Son   . Heart disease Maternal Grandmother   . Heart disease Maternal  Grandfather     Past Medical History:  Diagnosis Date  . Arthritis   . Atrial fibrillation ([place])   . Cataract   . CHF (congestive heart failure) ([place])   . Hypertension   . Osteoporosis   . Thyroid disease     Past Surgical History:  Procedure Laterality Date  . CESAREAN SECTION    . EYE SURGERY    . FRACTURE SURGERY      Social History:  reports that she has never smoked. She has never used smokeless tobacco. She reports that she does not drink alcohol or use drugs.  Allergies: No Known Allergies  (Not in a hospital admission)   Prior to Admission medications   Medication Sig Start Date End Date Taking? Authorizing Provider  diltiazem (CARDIZEM CD) 240 MG 24 hr capsule Take 240 mg by mouth 2 (two) times daily.  12/28/18   [provider]  metoprolol succinate (TOPROL-XL) 25 MG 24 hr tablet Take 1 tablet (25 mg total) by mouth daily. 05/14/19   Miquel Dunn, NP  metoprolol succinate (TOPROL-XL) 50 MG 24 hr tablet Take 50 mg by mouth daily.  01/21/19   [provider]  thyroid (ARMOUR THYROID) 120 MG tablet Take 1 tablet (120 mg total) by mouth daily before breakfast. Patient taking differently: Take 180 mg by mouth daily before breakfast.  05/14/19   Forrest Moron, MD  thyroid (ARMOUR THYROID) 60 MG tablet Take 60mg  by mouth daily and add to the 120mg  05/29/19   Forrest Moron, MD  torsemide (DEMADEX) 20 MG tablet Take 20 mg by mouth 2 (two)  times daily.  01/15/19   [provider]  warfarin (COUMADIN) 2.5 MG tablet Take 2.5 mg by mouth daily. Monday, Wednesday , Friday  AND SATURDAY    [provider]  warfarin (COUMADIN) 5 MG tablet Take 5 mg by mouth daily. TAKES 5 MG Tuesday, Thursday AND SATURDAY 12/28/18   [provider]    Blood pressure (!) 138/58, pulse 89, temperature 98.6 F (37 C), temperature source Oral, resp. rate (!) 21, SpO2 98 %. Physical Exam: General: pleasant, WD/WN white female who is laying in bed in  NAD HEENT:  Sclera are noninjected.  PERRL.  Ears and nose without any masses or lesions.  Mouth is pink and moist.  Heart: irregularly irregular.  Normal s1,s2. No obvious murmurs, gallops, or rubs noted.  Palpable pedal pulses bilaterally. BL LE edema L>R Lungs: CTAB, no wheezes, rhonchi, or rales noted.  Respiratory effort nonlabored, O2 sat high 80s on supplemental oxygen Abd: soft, ttp in RLQ without peritonitis, ND, +BS, no masses, hernias, or organomegaly MS:  calves soft and nontender, ROM grossly intact in BL UEs Skin: warm and dry with no masses, lesions, or rashes Psych: A&Ox4 with an appropriate affect Neuro: cranial nerves grossly intact, normal speech, though2 process intact  Results for orders placed or performed during the hospital encounter of 05/29/19 (from the past 48 hour(s))  Lipase, blood     Status: None   Collection Time: 05/29/19 12:23 PM  Result Value Ref Range   Lipase 23 11 - 51 U/L    Comment: Performed at [facility], [address] [place], [place] [address]  Comprehensive metabolic panel     Status: Abnormal   Collection Time: 05/29/19 12:23 PM  Result Value Ref Range   Sodium 136 135 - 145 mmol/L   Potassium 3.5 3.5 - 5.1 mmol/L   Chloride 92 (L) 98 - 111 mmol/L   CO2 32 22 - 32 mmol/L   Glucose, Bld 117 (H) 70 - 99 mg/dL   BUN 22 8 - 23 mg/dL   Creatinine, Ser 1.11 (H) 0.44 - 1.00 mg/dL   Calcium 8.9 8.9 - 10.3 mg/dL   Total Protein 7.3 6.5 - 8.1 g/dL   Albumin 2.6 (L) 3.5 - 5.0 g/dL   AST 23 15 - 41 U/L   ALT 18 0 - 44 U/L   Alkaline Phosphatase 74 38 - 126 U/L   Total Bilirubin 1.0 0.3 - 1.2 mg/dL   GFR calc non Af Amer 43 (L) >60 mL/min   GFR calc Af Amer 50 (L) >60 mL/min   Anion gap 12 5 - 15    Comment: Performed at [facility] [address] [place], [place] [address]  CBC     Status: Abnormal   Collection Time: 05/29/19 12:23 PM  Result Value Ref Range   WBC 15.2 (H) 4.0 - 10.5 K/uL   RBC 4.41 3.87 - 5.11 MIL/uL    Hemoglobin 13.7 12.0 - 15.0 g/dL   HCT 42.9 36.0 - 46.0 %   MCV 97.3 80.0 - 100.0 fL   MCH 31.1 26.0 - 34.0 pg   MCHC 31.9 30.0 - 36.0 g/dL   RDW 13.7 11.5 - 15.5 %   Platelets 271 150 - 400 K/uL   nRBC 0.0 0.0 - 0.2 %    Comment: Performed at [facility], [place] [address], [place],  [address]  Brain natriuretic peptide     Status: Abnormal   Collection Time:  05/29/19 12:23 PM  Result Value Ref Range   B Natriuretic Peptide 445.7 (H) 0.0 - 100.0 pg/mL    Comment: Performed at [facility] [address], [place], [place] [address]  Urinalysis, Routine w reflex microscopic     Status: Abnormal   Collection Time: 05/29/19  1:30 PM  Result Value Ref Range   Color, Urine AMBER (A) YELLOW    Comment: BIOCHEMICALS MAY BE AFFECTED BY COLOR   APPearance HAZY (A) CLEAR   Specific Gravity, Urine 1.013 1.005 - 1.030   pH 5.0 5.0 - 8.0   Glucose, UA NEGATIVE NEGATIVE mg/dL   Hgb urine dipstick NEGATIVE NEGATIVE   Bilirubin Urine NEGATIVE NEGATIVE   Ketones, ur NEGATIVE NEGATIVE mg/dL   Protein, ur NEGATIVE NEGATIVE mg/dL   Nitrite NEGATIVE NEGATIVE   Leukocytes,Ua NEGATIVE NEGATIVE    Comment: Performed at [facility] [address] [place], [place] [address]   CT ABDOMEN PELVIS W CONTRAST  Result Date: 05/29/2019 CLINICAL DATA:  Right lower quadrant pain. EXAM: CT ABDOMEN AND PELVIS WITH CONTRAST TECHNIQUE: Multidetector CT imaging of the abdomen and pelvis was performed using the standard protocol following bolus administration of intravenous contrast. CONTRAST:  70mL OMNIPAQUE IOHEXOL 300 MG/ML  SOLN COMPARISON:  No comparison studies available. FINDINGS: Lower chest: 2.6 cm nodular opacity identified posterior right lower lobe on image 10/series 4. Hepatobiliary: No suspicious focal abnormality within the liver parenchyma. There is no evidence for gallstones, gallbladder wall thickening, or pericholecystic fluid. No intrahepatic or extrahepatic biliary dilation.  Pancreas: No focal mass lesion. No dilatation of the main duct. No intraparenchymal cyst. No peripancreatic edema. Spleen: No splenomegaly. No focal mass lesion. Adrenals/Urinary Tract: Left adrenal thickening noted. Right adrenal gland unremarkable. 3.7 cm cystic lesion noted upper pole right kidney with potential septation or nodule posteriorly (26/2). Small exophytic cyst noted lower pole right kidney. Several cysts in the left kidney measure up to 3.4 cm. No evidence for hydroureter. The urinary bladder appears normal for the degree of distention. Stomach/Bowel: Stomach is unremarkable. No gastric wall thickening. No evidence of outlet obstruction. Duodenum is normally positioned as is the ligament of Treitz. No small bowel wall thickening. No small bowel dilatation. The terminal ileum is normal. Appendix is mildly dilated and fluid-filled measuring up to 19 mm diameter. This tracks posterior to the cecum and then lateral to the right abdominal wall where a complex multi septated 6.5 x 4.7 x 5.0 cm mass is identified. While this lesion does have an intraperitoneal component, it dissects through the abdominal wall into the dee p lateral abdominal wall muscles just anterior to the iliac crest. Diverticular changes are noted in the left colon without diverticulitis. Vascular/Lymphatic: There is abdominal aortic atherosclerosis without aneurysm. There is no gastrohepatic or hepatoduodenal ligament lymphadenopathy. Small lymph nodes are seen in the right lower quadrant around the large complex cystic process and there is a low-density mildly enlarged lymph node in the central ileocolic mesentery measuring up to 12 mm in short axis diameter (image 45/series 2). No pelvic sidewall lymphadenopathy. Reproductive: The uterus is unremarkable.  There is no adnexal mass. Other: No intraperitoneal free fluid. Musculoskeletal: Bones are diffusely demineralized. IMPRESSION: 1. Markedly dilated thick-walled appendix tracks  laterally to the lateral abdominal wall where a 7 x 5 x 5 cm complex multi septated rim enhancing collection is identified. This is most likely a complex abscess secondary to appendiceal rupture that is dissected into the abdominal wall. Less likely but not entirely  excluded, necrotic neoplasm from primary appendiceal adenocarcinoma would be a possibility. Low-density mildly enlarged ileocolic node associated. No intraperitoneal free fluid. 2. Bilateral renal cysts including a 3.7 cm cyst in the upper pole right kidney with potential septation or posterior mural nodule. Nonemergent follow-up MRI in 3 months could be used to assess stability in further characterize. 3. 2.6 cm nodular opacity posterior right lower lobe may be scarring/rounded atelectasis, but close follow-up recommended to ensure stability as neoplasm cannot be excluded. 4.  Aortic Atherosclerois (ICD10-170.0) Electronically Signed   By: Misty Stanley M.D.   On: 05/29/2019 15:17   DG Chest Portable 1 View  Result Date: 05/29/2019 CLINICAL DATA:  Right lower quadrant pain for 1 month.  Hypoxia. EXAM: PORTABLE CHEST 1 VIEW COMPARISON:  None. FINDINGS: Patient rotated right. Apical lordotic positioning. Moderate cardiomegaly. Atherosclerosis in the transverse aorta. Right paratracheal soft tissue fullness is likely due to prominent great vessels. Possible tiny left pleural effusion. Pulmonary interstitial prominence. Mild subsegmental atelectasis at the bases. IMPRESSION: Cardiomegaly with mild pulmonary interstitial prominence which could be chronic or represent mild pulmonary venous congestion. Possible tiny left pleural effusion with bibasilar subsegmental atelectasis. Aortic Atherosclerosis (ICD10-I70.0). Electronically Signed   By: Abigail Miyamoto M.D.   On: 05/29/2019 14:47      Assessment/Plan Permanent atrial fibrillation/flutter on coumadin CHF HTN Hypothyroidism  Perforated appendicitis with abscess 7 x 5 x 5 cm - Patient with  suspect perforated appendicitis with abscess. No role for acute surgical intervention. With her age and multiple medical problems, recommend admission to medicine. Needs IR to consult for possible drain placement. Agree with IV zosyn. Will continue to follow.  ID - zosyn VTE - SCDs, hold coumadin and check INR FEN - IVF, NPO Foley - none Follow up - TBD  Wellington Hampshire, [facility] Surgery 05/29/2019, 3:56 PM Please see Amion for pager number during day hours 7:00am-4:30pm

## 2019-05-29 NOTE — Progress Notes (Signed)
Established Patient Office Visit  Subjective:  Patient ID: Ashley Dunn, female    DOB: 14-Mar-1926  Age: 86 y.o. MRN: [id number]  CC:  Chief Complaint  Patient presents with  . RIGHT SIDE PAIN X 1 MONTH    RIGHT GROIN AREA INTERMITTEN PAIN WAS GOING AWAY BUT HAS COME BACK PER PT  . Medication Refill    PT NEEDS 60 MG OF AMOUR AS SHE IS NOW TAKING 180 MG, SHE HAS 120MG 'S AT HOME BUT NEEDS 60 MG.    HPI Ashley Dunn presents for   Right groin PAIN  Patient reports that she has right groin pain that is 3/10 She states that she gets the pain intermittently and is relieved by exercising She had breakfast at 7am of toast, fruit and eggs She denies nausea or vomiting  Indigestion She states that she also had indigestion  She states that she took papaya and ginger which relieved it It resolved and every once in a while she feels a little twinge A few days in the morning she feels lightheaded but it goes away after her   Hyperthyroid At the last visit she was only taking the 120mg  of Armor thyroid  The pharmacy does not have 180 mg of armor thyroid She needs a 60mg  refill  Acquired Thrombophilia She is getting INR checked with the cardiologist She states that she is taking her current meds Her dose of Coumadin is 5mg  T, TH, Sat And 2.5 M, W, F, Sun She denies bleeding from gums, or any hematuria She gets some bleeding from her hemorrhoids.   Past Medical History:  Diagnosis Date  . Arthritis   . Atrial fibrillation ([place])   . Cataract   . CHF (congestive heart failure) ([place])   . Hypertension   . Osteoporosis   . Thyroid disease     Past Surgical History:  Procedure Laterality Date  . CESAREAN SECTION    . EYE SURGERY    . FRACTURE SURGERY      Family History  Problem Relation Age of Onset  . Stroke Mother   . Cancer Father   . Hyperlipidemia Brother   . Healthy Daughter   . Healthy Son   . Heart disease Maternal Grandmother   . Heart disease Maternal  Grandfather     Social History   Socioeconomic History  . Marital status: Widowed    Spouse name: Not on file  . Number of children: Not on file  . Years of education: Not on file  . Highest education level: Not on file  Occupational History  . Not on file  Tobacco Use  . Smoking status: Never Smoker  . Smokeless tobacco: Never Used  Substance and Sexual Activity  . Alcohol use: Never  . Drug use: Never  . Sexual activity: Not Currently  Other Topics Concern  . Not on file  Social History Narrative   Lives at home with duaghter   Social Determinants of Health   Financial Resource Strain:   . Difficulty of Paying Living Expenses: Not on file  Food Insecurity:   . Worried About Charity fundraiser in the Last Year: Not on file  . Ran Out of Food in the Last Year: Not on file  Transportation Needs:   . Lack of Transportation (Medical): Not on file  . Lack of Transportation (Non-Medical): Not on file  Physical Activity:   . Days of Exercise per Week: Not on file  . Minutes of Exercise per Session:  Not on file  Stress:   . Feeling of Stress : Not on file  Social Connections:   . Frequency of Communication with Friends and Family: Not on file  . Frequency of Social Gatherings with Friends and Family: Not on file  . Attends Religious Services: Not on file  . Active Member of Clubs or Organizations: Not on file  . Attends Archivist Meetings: Not on file  . Marital Status: Not on file  Intimate Partner Violence:   . Fear of Current or Ex-Partner: Not on file  . Emotionally Abused: Not on file  . Physically Abused: Not on file  . Sexually Abused: Not on file    Outpatient Medications Prior to Visit  Medication Sig Dispense Refill  . diltiazem (CARDIZEM CD) 240 MG 24 hr capsule Take 240 mg by mouth 2 (two) times daily.     . metoprolol succinate (TOPROL-XL) 25 MG 24 hr tablet Take 1 tablet (25 mg total) by mouth daily. 90 tablet 3  . metoprolol succinate  (TOPROL-XL) 50 MG 24 hr tablet Take 50 mg by mouth daily.     Marland Kitchen thyroid (ARMOUR THYROID) 120 MG tablet Take 1 tablet (120 mg total) by mouth daily before breakfast. (Patient taking differently: Take 180 mg by mouth daily before breakfast. ) 90 tablet 1  . torsemide (DEMADEX) 20 MG tablet Take 20 mg by mouth 2 (two) times daily.     Marland Kitchen warfarin (COUMADIN) 2.5 MG tablet Take 2.5 mg by mouth daily. Monday, Wednesday , Friday  AND SATURDAY    . warfarin (COUMADIN) 5 MG tablet Take 5 mg by mouth daily. TAKES 5 MG Tuesday, Thursday AND SATURDAY     No facility-administered medications prior to visit.    No Known Allergies  ROS Review of Systems SEE HPI   Objective:    Physical Exam  BP 136/80 (BP Location: Right Arm, Patient Position: Sitting, Cuff Size: Normal)   Pulse 70   Temp 98.1 F (36.7 C) (Oral)   Resp 17   Ht 5' (1.524 m)   Wt 164 lb (74.4 kg)   SpO2 96%   BMI 32.03 kg/m  Wt Readings from Last 3 Encounters:  05/29/19 164 lb (74.4 kg)  04/29/19 150 lb 6.4 oz (68.2 kg)  02/27/19 160 lb (72.6 kg)   Physical Exam  Constitutional: Oriented to person, place, and time. Appears well-developed and well-nourished.  HENT:  Head: Normocephalic and atraumatic.  Eyes: Conjunctivae and EOM are normal.  Cardiovascular: Normal rate, regular rhythm, normal heart sounds and intact distal pulses.  No murmur heard. Pulmonary/Chest: Effort normal and breath sounds normal. No stridor. No respiratory distress. Has no wheezes.  Abdomen: nondistended, normoactive bs, soft, exquisitely tender to palpation in the RLQ at McBurney's Point No umbilical tenderness REBOUND PRESENT Neurological: Is alert and oriented to person, place, and time.  Skin: Skin is warm. Capillary refill takes less than 2 seconds.  Psychiatric: Has a normal mood and affect. Behavior is normal. Judgment and thought content normal.    There are no preventive care reminders to display for this patient.  There are no  preventive care reminders to display for this patient.  Lab Results  Component Value Date   TSH 0.043 (L) 04/29/2019   Lab Results  Component Value Date   WBC 7.3 02/06/2019   HGB 15.7 02/06/2019   HCT 48.4 (H) 02/06/2019   MCV 96 02/06/2019   PLT 179 02/06/2019   Lab Results  Component Value Date  NA 144 02/06/2019   K 3.7 02/06/2019   CO2 29 02/06/2019   GLUCOSE 104 (H) 02/06/2019   BUN 39 (H) 02/06/2019   CREATININE 1.33 (H) 02/06/2019   BILITOT 0.6 02/06/2019   ALKPHOS 92 02/06/2019   AST 32 02/06/2019   ALT 19 02/06/2019   PROT 7.2 02/06/2019   ALBUMIN 4.1 02/06/2019   CALCIUM 9.7 02/06/2019   Lab Results  Component Value Date   CHOL 165 02/06/2019   Lab Results  Component Value Date   HDL 48 02/06/2019   Lab Results  Component Value Date   LDLCALC 104 (H) 02/06/2019   Lab Results  Component Value Date   TRIG 67 02/06/2019   Lab Results  Component Value Date   CHOLHDL 3.4 02/06/2019   No results found for: HGBA1C    Assessment & Plan:   Problem List Items Addressed This Visit    None    Visit Diagnoses    RLQ abdominal pain    -  Primary Based on patient exam very concerning for appendicitis given her RLQ pain at McBurney's point, guarding and leukocytosis.  ADVISED TO GO TO THE ER   Relevant Orders   POCT CBC   RLQ distress       Relevant Orders   POCT CBC   Acquired thrombophilia (HCC)       Iatrogenic hyperthyroidism    -  Add the 60mg  armour thyroid to 120mg  to have dose of 180mg  Continue to take meds as instructed Will reassess in April and if the levels are unimproved will send to Endocrinology   Relevant Medications   thyroid (ARMOUR THYROID) 60 MG tablet   Acute abdomen    -  Will send to the ER for evaluation for acute appendicitis       Meds ordered this encounter  Medications  . thyroid (ARMOUR THYROID) 60 MG tablet    Sig: Take 60mg  by mouth daily and add to the 120mg     Dispense:  90 tablet    Refill:  1     Follow-up: No follow-ups on file.   A total of 45 minutes were spent face-to-face with the patient during this encounter and over half of that time was spent on counseling and coordination of care.   Forrest Moron, MD

## 2019-05-29 NOTE — ED Notes (Signed)
Pt ambulated with personal cane and 1 assist to the restroom. Urine with culture collected. Labs have been drawn.

## 2019-05-30 ENCOUNTER — Inpatient Hospital Stay (HOSPITAL_COMMUNITY): Payer: Medicare Other

## 2019-05-30 DIAGNOSIS — K3533 Acute appendicitis with perforation and localized peritonitis, with abscess: Principal | ICD-10-CM

## 2019-05-30 DIAGNOSIS — Z66 Do not resuscitate: Secondary | ICD-10-CM

## 2019-05-30 DIAGNOSIS — Z79890 Hormone replacement therapy (postmenopausal): Secondary | ICD-10-CM

## 2019-05-30 DIAGNOSIS — Z7901 Long term (current) use of anticoagulants: Secondary | ICD-10-CM

## 2019-05-30 DIAGNOSIS — I509 Heart failure, unspecified: Secondary | ICD-10-CM

## 2019-05-30 DIAGNOSIS — E039 Hypothyroidism, unspecified: Secondary | ICD-10-CM

## 2019-05-30 DIAGNOSIS — Z79899 Other long term (current) drug therapy: Secondary | ICD-10-CM

## 2019-05-30 DIAGNOSIS — I4819 Other persistent atrial fibrillation: Secondary | ICD-10-CM

## 2019-05-30 DIAGNOSIS — I110 Hypertensive heart disease with heart failure: Secondary | ICD-10-CM

## 2019-05-30 LAB — PROTIME-INR
INR: 1.5 — ABNORMAL HIGH (ref 0.8–1.2)
Prothrombin Time: 17.8 seconds — ABNORMAL HIGH (ref 11.4–15.2)

## 2019-05-30 LAB — BASIC METABOLIC PANEL
Anion gap: 12 (ref 5–15)
BUN: 17 mg/dL (ref 8–23)
CO2: 33 mmol/L — ABNORMAL HIGH (ref 22–32)
Calcium: 8.6 mg/dL — ABNORMAL LOW (ref 8.9–10.3)
Chloride: 96 mmol/L — ABNORMAL LOW (ref 98–111)
Creatinine, Ser: 1.09 mg/dL — ABNORMAL HIGH (ref 0.44–1.00)
GFR calc Af Amer: 51 mL/min — ABNORMAL LOW (ref >60)
GFR calc non Af Amer: 44 mL/min — ABNORMAL LOW (ref >60)
Glucose, Bld: 101 mg/dL — ABNORMAL HIGH (ref 70–99)
Potassium: 3.4 mmol/L — ABNORMAL LOW (ref 3.5–5.1)
Sodium: 141 mmol/L (ref 135–145)

## 2019-05-30 LAB — CBC
HCT: 39.1 % (ref 36.0–46.0)
Hemoglobin: 12.9 g/dL (ref 12.0–15.0)
MCH: 31.2 pg (ref 26.0–34.0)
MCHC: 33 g/dL (ref 30.0–36.0)
MCV: 94.4 fL (ref 80.0–100.0)
Platelets: 263 10*3/uL (ref 150–400)
RBC: 4.14 MIL/uL (ref 3.87–5.11)
RDW: 13.7 % (ref 11.5–15.5)
WBC: 13.7 10*3/uL — ABNORMAL HIGH (ref 4.0–10.5)
nRBC: 0 % (ref 0.0–0.2)

## 2019-05-30 LAB — URINE CULTURE: Special Requests: NORMAL

## 2019-05-30 MED ORDER — FENTANYL CITRATE (PF) 100 MCG/2ML IJ SOLN
INTRAMUSCULAR | Status: AC | PRN
  Administered 2019-05-30: 25 ug via INTRAVENOUS

## 2019-05-30 MED ORDER — FENTANYL CITRATE (PF) 100 MCG/2ML IJ SOLN
INTRAMUSCULAR | Status: AC
  Filled 2019-05-30: qty 2

## 2019-05-30 MED ORDER — WARFARIN SODIUM 5 MG PO TABS
5.0000 mg | ORAL_TABLET | Freq: Once | ORAL | Status: AC
  Administered 2019-05-30: 5 mg via ORAL
  Filled 2019-05-30: qty 1

## 2019-05-30 MED ORDER — MIDAZOLAM HCL 2 MG/2ML IJ SOLN
INTRAMUSCULAR | Status: AC
  Filled 2019-05-30: qty 4

## 2019-05-30 MED ORDER — MIDAZOLAM HCL 2 MG/2ML IJ SOLN
INTRAMUSCULAR | Status: AC | PRN
  Administered 2019-05-30: 1 mg via INTRAVENOUS
  Administered 2019-05-30: 0.5 mg via INTRAVENOUS

## 2019-05-30 MED ORDER — SODIUM CHLORIDE 0.9% FLUSH
5.0000 mL | Freq: Three times a day (TID) | INTRAVENOUS | Status: DC
  Administered 2019-05-30 – 2019-06-02 (×8): 5 mL

## 2019-05-30 MED ORDER — WARFARIN - PHARMACIST DOSING INPATIENT: Freq: Every day | Status: DC

## 2019-05-30 NOTE — Progress Notes (Signed)
Subjective: HD#1 Overnight, no acute events reported.  This morning, patient evaluated at bedside. Patient reports that she is feeling good today, she denies any new issues. All questions and concerns addressed.   Objective:  Vital signs in last 24 hours: Vitals:   05/29/19 1800 05/29/19 1900 05/29/19 2123 05/30/19 0630  BP: 122/73 129/89 123/65 130/72  Pulse: 80   (!) 124  Resp: (!) 22     Temp:  98.5 F (36.9 C)  98.1 F (36.7 C)  TempSrc:  Oral  Oral  SpO2: 98% 94%  91%  Weight:    74.1 kg   CBC Latest Ref Rng & Units 05/30/2019 05/29/2019 05/29/2019  WBC 4.0 - 10.5 K/uL 13.7(H) 15.2(H) 15.8(A)  Hemoglobin 12.0 - 15.0 g/dL 12.9 13.7 13.8  Hematocrit 36.0 - 46.0 % 39.1 42.9 41.2(A)  Platelets 150 - 400 K/uL 263 271 -   BMP Latest Ref Rng & Units 05/30/2019 05/29/2019 02/06/2019  Glucose 70 - 99 mg/dL 101(H) 117(H) 104(H)  BUN 8 - 23 mg/dL 17 22 39(H)  Creatinine 0.44 - 1.00 mg/dL 1.09(H) 1.11(H) 1.33(H)  BUN/Creat Ratio 12 - 28 - - 29(H)  Sodium 135 - 145 mmol/L 141 136 144  Potassium 3.5 - 5.1 mmol/L 3.4(L) 3.5 3.7  Chloride 98 - 111 mmol/L 96(L) 92(L) 96  CO2 22 - 32 mmol/L 33(H) 32 29  Calcium 8.9 - 10.3 mg/dL 8.6(L) 8.9 9.7   Physical Exam  Constitutional: She is oriented to person, place, and time and well-developed, well-nourished, and in no distress.  Cardiovascular: S1 normal, S2 normal and intact distal pulses. An irregularly irregular rhythm present. Tachycardia present.  No murmur heard. Pulmonary/Chest: Breath sounds normal. No respiratory distress. She has no wheezes. She has no rales.  Abdominal: Soft. Bowel sounds are normal. She exhibits no distension. There is abdominal tenderness in the right lower quadrant.  Musculoskeletal:        General: No tenderness. Normal range of motion.  Neurological: She is alert and oriented to person, place, and time.  Skin: Skin is warm and dry.   Assessment/Plan: Ms. Ashley Dunn is a 86year old community dwelling female  with PMHx of atrial fibrillation on warfarin, hypertension, hypothyroidism, and congestive heart failure presenting with one month history of intermittent RLQ abdominal pain and imaging concerning for complex abscess secondary to appendiceal rupture into abdominal wall.   Perforated appendicitis with large abscess Patient with one month history of intermittent RLQ abdominal pain with tenderness to palpation of McBurney point. CT concerning for appendiceal rupture w/complex abscess in lateral abdominal wall vs necrotic neoplasm from primary appendiceal adenocarcinoma. Given her significant leukocytosis and clinical presentation, more consistent with abscess 2/2 perforated appendicitis. Patient on IV zosyn and s/p CT guided abscess drainage with IR today.  - Continue IV zosyn 3.375mg  q8h (day 2) - F/u drain output  - F/u fluid cultures  - CBC daily  Persistent atrial fibrillation:  History of A.fib with CHADSVASC2 of 5 on chronic warfarin therapy. Rate controlled with diltiazem and metoprolol at home.  - Continue diltiazem 240mg  bid - Continue metoprolol 25mg  qd+50mg  qHS - Continue warfarin per schedule (5 mg on TThSat, 2.5mg  MWFSun) - INR/PT monitoring  - Continuous cardiac monitoring  Hypothyroidism: - Continue armour thyroid 180mg  qd   CHF:  Currently appears euvolemic on exam.  - Continue metoprolol 25mg  qd + 50mg  qHS - Torsemide 20mg  bid  Hypertension - Resume home medications as above  FEN/GI:  Diet: clear liquid Fluids: None  VTE Prophylaxis: SCDs, resume home warfarin Code: DNR/DNI  Prior to Admission Living Arrangement: Home  Anticipated Discharge Location: [facility] vs SNF Barriers to Discharge: ongoing medical work up Dispo: Anticipated discharge pending clinical improvement.   Harvie Heck, MD Internal Medicine, PGY-1 Pager # [phone] 05/30/19   7:22 AM

## 2019-05-30 NOTE — Sedation Documentation (Signed)
Patient denies pain and is resting comfortably.  

## 2019-05-30 NOTE — Discharge Instructions (Addendum)
Appendicitis, Adult  Appendicitis is inflammation of the appendix. The appendix is a finger-shaped tube that is attached to the large intestine. If appendicitis is not treated, it can cause the appendix to tear (rupture). A ruptured appendix can lead to a life-threatening infection. It can also cause a painful collection of pus (abscess) to form in the appendix. What are the causes? This condition may be caused by a blockage in the appendix that leads to infection. The blockage can be caused by:  A ball of stool (feces).  Enlarged lymph glands. In some cases, the cause may not be known. What increases the risk? Age is a risk factor. You are more likely to develop this condition if you are between 6 and 93 years of age. What are the signs or symptoms? Symptoms of this condition include:  Pain that starts around the belly button and moves toward the lower right part of the abdomen. The pain can become more severe as time passes. It gets worse with coughing or sudden movements.  Tenderness in the lower right abdomen.  Nausea.  Vomiting.  Loss of appetite.  Fever.  Difficulty passing stool (constipation).  Passing very loose stools (diarrhea).  Generally feeling unwell. How is this diagnosed? This condition may be diagnosed with:  A physical exam.  Blood tests.  Urine test. To confirm the diagnosis, an ultrasound, MRI, or CT scan may be done. How is this treated? This condition is usually treated with surgery to remove the appendix (appendectomy). There are two methods for doing an appendectomy:  Open appendectomy. In this surgery, the appendix is removed through a large incision that is made in the lower right abdomen. This procedure may be recommended if: ? You have major scarring from a previous surgery. ? You have a bleeding disorder. ? You are pregnant and are about to give birth. ? You have a condition that makes it hard to do surgery through small incisions  (laparoscopic procedure). This includes severe infection or a ruptured appendix.  Laparoscopic appendectomy. In this surgery, the appendix is removed through small incisions. This procedure usually causes less pain and fewer problems than an open appendectomy. It also has a shorter recovery time. If the appendix has ruptured and an abscess has formed:  A drain may be placed into the abscess to remove fluid.  Antibiotic medicines may be given through an IV.  The appendix may or may not need to be removed. Follow these instructions at home: If you had surgery, follow instructions from your health care provider about how to care for yourself at home and how to care for your incision. Medicines  Take over-the-counter and prescription medicines only as told by your health care provider.  If you were prescribed an antibiotic medicine, take it as told by your health care provider. Do not stop taking the antibiotic even if you start to feel better. Eating and drinking  Follow instructions from your health care provider about eating restrictions. You may slowly resume a regular diet once your nausea or vomiting stops. General instructions  Do not use any products that contain nicotine or tobacco, such as cigarettes, e-cigarettes, and chewing tobacco. If you need help quitting, ask your health care provider.  Do not drive or use heavy machinery while taking prescription pain medicine.  Ask your health care provider if the medicine prescribed to you can cause constipation. You may need to take steps to prevent or treat constipation, such as: ? Drink enough fluid to keep  your urine pale yellow. ? Take over-the-counter or prescription medicines. ? Eat foods that are high in fiber, such as beans, whole grains, and fresh fruits and vegetables. ? Limit foods that are high in fat and processed sugars, such as fried or sweet foods.  Keep all follow-up visits as told by your health care provider. This  is important. Contact a health care provider if:  There is pus, blood, or excessive drainage coming from your incision.  You have nausea or vomiting. Get help right away if you have:  Worsening abdominal pain.  A fever.  Chills.  Fatigue.  Muscle aches.  Shortness of breath. Summary  Appendicitis is inflammation of the appendix.  This condition may be caused by a blockage in the appendix that leads to infection.  This condition is usually treated with surgery to remove the appendix. This information is not intended to replace advice given to you by your health care provider. Make sure you discuss any questions you have with your health care provider. Document Revised: 09/27/2017 Document Reviewed: 09/27/2017 Elsevier Patient Education  [place]:   You have a closed bulb drain to help you heal.    A bulb drain is a small, plastic reservoir which creates a gentle suction. It is used to remove excess fluid from a surgical wound. The color and amount of fluid will vary. Immediately after surgery, the fluid is bright red. It may gradually change to a yellow color. When the amount decreases to about 1 or 2 tablespoons (15 to 30 cc) per 24 hours, your caregiver will usually remove it.  JP Care  The Jackson-Pratt drainage system has flexible tubing attached to a soft, plastic bulb with a stopper. The drainage end of the tubing, which is flat and white, goes into your body through a small opening near your incision (surgical cut). A stitch holds the drainage end in place. The rest of the tube is outside your body, attached to the bulb. When the bulb is compressed with the stopper in place, it creates a vacuum. This causes a constant gentle suction, which helps draw out fluid that collects under your incision. The bulb should be compressed at all times, except when you are emptying the drainage.  How long you will have your Jackson-Pratt depends on your surgery  and the amount of fluid is draining. This is different for everyone. The Jackson-Pratt is usually removed when the drainage is 30 mL or less over 24 hours. To keep track of how much drainage you're having, you will record the amount in a drainage log. It's important to bring the log with you to your follow-up appointments.  Caring for Your Jackson-Pratt at Home In order to care for your Jackson-Pratt at home, you or your caregiver will do the following:  Empty the drain once a day and record the color and amount of drainage  Care for the area where the tubing enters your skin by washing with soap and water.  Milk the tubing to help move clots into the bulb.  Do this before you empty and measure your drainage. Look in the mirror at the tubing. This will help you see where your hands need to be. Pinch the tubing close to where it goes into your skin between your thumb and forefinger. With the thumb and forefinger of your other hand, pinch the tubing right below your other fingers. Keep your fingers pinched and slide them down the tubing, pushing any clots  down toward the bulb. You may want to use alcohol swabs to help you slide your fingers down the tubing. Repeat steps 3 and 4 as necessary to push clots from the tubing into the bulb. If you are not able to move a clot into the bulb, call your doctor's office. The fluid may leak around the insertion site if a clot is blocking the drainage flow. If there is fluid in the bulb and no leakage at the insertion site, the drain is working.  How to Empty Your Jackson-Pratt and Record the Drainage You will need to empty your Jackson-Pratt every day  Gather the following supplies:  Measuring container your nurse gave you Jackson-Pratt Drainage Record  Pen or pencil  Instructions Clean an area to work on. Clean your hands thoroughly. Unplug the stopper on top of your Jackson-Pratt. This will cause the bulb to expand. Do not touch the inside of the  stopper or the inner area of the opening on the bulb. Turn your Jackson-Pratt upside down, gently squeeze the bulb, and pour the drainage into the measuring container. Turn your Jackson-Pratt right side up. Squeeze the bulb until your fingers feel the palm of your hand. Keep squeezing the bulb while you replug the stopper. Make sure the bulb stays fully compressed to ensure constant, gentle suction.    Check the amount and color of drainage in the measuring container. The first couple days after surgery the fluid may be dark red. This is normal. As you heal the fluid may look pink or pale yellow. Record this amount and the color of drainage on your Jackson-Pratt Drainage Record. Flush the drainage down the toilet and rinse the measuring container with water.  Caring for the Insertion Site  Once you have emptied the drainage, clean your hands again. Check the area around the insertion site. Look for tenderness, swelling, or pus. If you have any of these, or if you have a temperature of 101 F (38.3 C) or higher, you may have an infection. Call your doctor's office.  Sometimes, the drain causes redness the size of a dime at your insertion site. This is normal. Your healthcare provider will tell you if you should place a bandage over the insertion site.  Wash drain site with soap & water (dilute hydrogen peroxide PRN) daily & replace clean dressing / tape    DAILY CARE  Keep the bulb compressed at all times, except while emptying it. The compression creates suction.   Keep sites where the tubes enter the skin dry and covered with a light bandage (dressing).   Tape the tubes to your skin, 1 to 2 inches below the insertion sites, to keep from pulling on your stitches. Tubes are stitched in place and will not slip out.   Pin the bulb to your shirt (not to your pants) with a safety pin.   For the first few days after surgery, there usually is more fluid in the bulb. Empty the bulb whenever  it becomes half full because the bulb does not create enough suction if it is too full. Include this amount in your 24 hour totals.   When the amount of drainage decreases, empty the bulb at the same time every day. Write down the amounts and the 24 hour totals. Your caregiver will want to know them. This helps your caregiver know when the tubes can be removed.   (We anticipate removing the drain in 1-3 weeks, depending on when the output is <75mL a  day for 2+ days)  If there is drainage around the tube sites, change dressings and keep the area dry. If you see a clot in the tube, leave it alone. However, if the tube does not appear to be draining, let your caregiver know.  TO EMPTY THE BULB  Open the stopper to release suction.   Holding the stopper out of the way, pour drainage into the measuring cup that was sent home with you.   Measure and write down the amount. If there are 2 bulbs, note the amount of drainage from bulb 1 or bulb 2 and keep the totals separate. Your caregiver will want to know which tube is draining more.   Compress the bulb by folding it in half.   Replace the stopper.   Check the tape that holds the tube to your skin, and pin the bulb to your shirt.  SEEK MEDICAL CARE IF:  The drainage develops a bad odor.   You have an oral temperature above 102 F (38.9 C).   The amount of drainage from your wound suddenly increases or decreases.   You accidentally pull out your drain.   You have any other questions or concerns.  MAKE SURE YOU:   Understand these instructions.   Will watch your condition.   Will get help right away if you are not doing well or get worse.     Call our office if you have any questions about your drain. [phone]   Information on my medicine - Coumadin   (Warfarin)   Why was Coumadin prescribed for you? Coumadin was prescribed for you because you have a blood clot or a medical condition that can cause an increased risk of  forming blood clots. Blood clots can cause serious health problems by blocking the flow of blood to the heart, lung, or brain. Coumadin can prevent harmful blood clots from forming. As a reminder your indication for Coumadin is:   Stroke Prevention Because Of Atrial Fibrillation  What test will check on my response to Coumadin? While on Coumadin (warfarin) you will need to have an INR test regularly to ensure that your dose is keeping you in the desired range. The INR (international normalized ratio) number is calculated from the result of the laboratory test called prothrombin time (PT).  If an INR APPOINTMENT HAS NOT ALREADY BEEN MADE FOR YOU please schedule an appointment to have this lab work done by your health care provider within 7 days. Your INR goal is usually a number between:  2 to 3 or your provider may give you a more narrow range like 2-2.5.  Ask your health care provider during an office visit what your goal INR is.  What  do you need to  know  About  COUMADIN? Take Coumadin (warfarin) exactly as prescribed by your healthcare provider about the same time each day.  DO NOT stop taking without talking to the doctor who prescribed the medication.  Stopping without other blood clot prevention medication to take the place of Coumadin may increase your risk of developing a new clot or stroke.  Get refills before you run out.  What do you do if you miss a dose? If you miss a dose, take it as soon as you remember on the same day then continue your regularly scheduled regimen the next day.  Do not take two doses of Coumadin at the same time.  Important Safety Information A possible side effect of Coumadin (Warfarin) is an increased  risk of bleeding. You should call your healthcare provider right away if you experience any of the following: ? Bleeding from an injury or your nose that does not stop. ? Unusual colored urine (red or dark brown) or unusual colored stools (red or black). ? Unusual  bruising for unknown reasons. ? A serious fall or if you hit your head (even if there is no bleeding).  Some foods or medicines interact with Coumadin (warfarin) and might alter your response to warfarin. To help avoid this: ? Eat a balanced diet, maintaining a consistent amount of Vitamin K. ? Notify your provider about major diet changes you plan to make. ? Avoid alcohol or limit your intake to 1 drink for women and 2 drinks for men per day. (1 drink is 5 oz. wine, 12 oz. beer, or 1.5 oz. liquor.)  Make sure that ANY health care provider who prescribes medication for you knows that you are taking Coumadin (warfarin).  Also make sure the healthcare provider who is monitoring your Coumadin knows when you have started a new medication including herbals and non-prescription products.  Coumadin (Warfarin)  Major Drug Interactions  Increased Warfarin Effect Decreased Warfarin Effect  Alcohol (large quantities) Antibiotics (esp. Septra/Bactrim, Flagyl, Cipro) Amiodarone (Cordarone) Aspirin (ASA) Cimetidine (Tagamet) Megestrol (Megace) NSAIDs (ibuprofen, naproxen, etc.) Piroxicam (Feldene) Propafenone (Rythmol SR) Propranolol (Inderal) Isoniazid (INH) Posaconazole (Noxafil) Barbiturates (Phenobarbital) Carbamazepine (Tegretol) Chlordiazepoxide (Librium) Cholestyramine (Questran) Griseofulvin Oral Contraceptives Rifampin Sucralfate (Carafate) Vitamin K   Coumadin (Warfarin) Major Herbal Interactions  Increased Warfarin Effect Decreased Warfarin Effect  Garlic Ginseng Ginkgo biloba Coenzyme Q10 Green tea St. John's wort    Coumadin (Warfarin) FOOD Interactions  Eat a consistent number of servings per week of foods HIGH in Vitamin K (1 serving =  cup)  Collards (cooked, or boiled & drained) Kale (cooked, or boiled & drained) Mustard greens (cooked, or boiled & drained) Parsley *serving size only =  cup Spinach (cooked, or boiled & drained) Swiss chard (cooked, or boiled  & drained) Turnip greens (cooked, or boiled & drained)  Eat a consistent number of servings per week of foods MEDIUM-HIGH in Vitamin K (1 serving = 1 cup)  Asparagus (cooked, or boiled & drained) Broccoli (cooked, boiled & drained, or raw & chopped) Brussel sprouts (cooked, or boiled & drained) *serving size only =  cup Lettuce, raw (green leaf, endive, romaine) Spinach, raw Turnip greens, raw & chopped   These websites have more information on Coumadin (warfarin):  FailFactory.se; VeganReport.com.au;

## 2019-05-30 NOTE — Progress Notes (Signed)
  Date: 05/30/2019  Patient name: Ashley Dunn  Medical record number: [id number]  Date of birth: 10-29-1925   I have seen and evaluated Ralene Bathe and discussed their care with the Residency Team.  In brief, patient is a 86 year old female with a past medical history significant for persistent A. fib on warfarin, hypertension, hypothyroidism who presented to the ED with right lower quadrant abdominal pain over the last month.  Patient states that she has had intermittent right lower quadrant abdominal pain over the last month which is dull in nature.  Patient states that the pain has worsened over the last couple of days and she went to see her PCP yesterday for this.  PCP noted tenderness to palpation over right lower quadrant and advised the patient to come to the ED for further evaluation.  No fevers or chills, no nausea or vomiting, no diarrhea, no headache, no blurry vision, no focal weakness, tingling or numbness, no lightheadedness, no syncope.  In the ED patient was noted to have a large complex abscess in the right lower quadrant secondary to likely perforated appendicitis.  Today patient states that she feels okay but has some mild persistent right lower quadrant pain.  PMHx, Fam Hx, and/or Soc Hx : As per resident admit note  Vitals:   05/30/19 1435 05/30/19 1454  BP: (!) 118/59 (!) 118/59  Pulse: 68   Resp: (!) 24   Temp:  (!) 97.4 F (36.3 C)  SpO2: 100%    General: Awake, alert, oriented x3, NAD CVS: Irregularly irregular, normal rate Lungs: CTA bilaterally Abdomen: Soft, normoactive bowel sounds, nondistended, moderate tenderness noted in the right lower quadrant. Extremities: No edema noted, nontender to palpation Psych: Normal mood and affect HEENT: Normocephalic, atraumatic Skin: Warm and dry  Assessment and Plan: I have seen and evaluated the patient as outlined above. I agree with the formulated Assessment and Plan as detailed in the residents' note, with the  following changes:   1.  Perforated appendicitis with large intra-abdominal abscess: -Patient presented to the ED with persistent right lower quadrant pain over the last month which is worsened over the last couple of days and is found to have perforated appendicitis with a large abscess noted on CT. -Surgery follow-up and recommendations appreciated.  Patient is not a surgical candidate. -IR placed 10 French drain and drained 15 mL of yellow purulent fluid. -Gram stain from abscess fluid shows moderate gram negative rods.  We will follow up cultures -Continue with IV Zosyn for now -Patient with leukocytosis up to 15 which is improved slightly to 13 today.  We will continue to monitor.  2.  Persistent A. Fib: -Patient is better rate controlled today.  We will continue with diltiazem 240 mg twice daily as well as metoprolol. -Patient's INR is subtherapeutic (Coumadin was on hold for procedure).  We will resume warfarin today -We will continue to monitor INR -No further work-up at this time  Aldine Contes, MD 2/4/20213:55 PM

## 2019-05-30 NOTE — Procedures (Signed)
Interventional Radiology Procedure:   Indications: Acute appendicitis with abscess  Procedure: CT guided abscess drainage  Findings: 10 Fr drain placed, 15 ml of yellow purulent fluid obtained.   Complications: None     EBL: less than 10 ml  Plan: Send fluid for culture and follow drain output.     Virgal Warmuth R. Anselm Pancoast, MD  Pager: [phone]

## 2019-05-30 NOTE — Consult Note (Signed)
Chief Complaint: Patient was seen in consultation today for CT-guided appendiceal abscess drain  Chief Complaint  Patient presents with  . Abdominal Pain     Referring Physician(s): Dr. Georgette Dover  Supervising Physician: Markus Daft  Patient Status: [facility] - In-pt  History of Present Illness: Ashley Dunn is a 86 y.o. female who presented to the ER yesterday with abdominal pain x 1 month that had worsened in the past few days. She did not have any fever, nausea or vomiting associated with it. CT of the abdomen and pelvis showed "markedly dilated thick-walled appendix tracks laterally to the lateral abdominal wall where a 7 x 5 x 5 cm complex multi septated rim enhancing collection is identified. This is most likely a complex abscess secondary to appendiceal rupture that is dissected into the abdominal wall". Patient was admitted and started on IV broad spectrum antibiotics.   IR has been consulted for CT-guided appendiceal abscess drain. Today, patient is alert and appears to be comfortable in the bed. She reports RLQ pain when she palpates it. Patient denies nausea, vomiting, fever, headaches, chest pain or SOB. Labs today showed elevated WBC (13.7) and elevated PT (17.8) and INR (1.5). Patient is afebrile and has an SpO2 of 91% on RA. Procedure was explained to the patient, including the risks and benefits.   Past Medical History:  Diagnosis Date  . Arthritis   . Atrial fibrillation ([place])   . Cataract   . CHF (congestive heart failure) ([place])   . Hypertension   . Osteoporosis   . Thyroid disease     Past Surgical History:  Procedure Laterality Date  . CESAREAN SECTION    . EYE SURGERY    . FRACTURE SURGERY      Allergies: Patient has no known allergies.  Medications: Prior to Admission medications   Medication Sig Start Date End Date Taking? Authorizing Provider  warfarin (COUMADIN) 2.5 MG tablet Take 2.5 mg by mouth See admin instructions. Take 2.5 mg by mouth   Yes  [provider]  diltiazem (CARDIZEM CD) 240 MG 24 hr capsule Take 240 mg by mouth 2 (two) times daily.  12/28/18   [provider]  metoprolol succinate (TOPROL-XL) 25 MG 24 hr tablet Take 1 tablet (25 mg total) by mouth daily. 05/14/19   Miquel Dunn, NP  metoprolol succinate (TOPROL-XL) 50 MG 24 hr tablet Take 50 mg by mouth at bedtime.  01/21/19   [provider]  thyroid (ARMOUR THYROID) 120 MG tablet Take 1 tablet (120 mg total) by mouth daily before breakfast. Patient taking differently: Take 180 mg by mouth daily before breakfast.  05/14/19   Forrest Moron, MD  thyroid (ARMOUR THYROID) 60 MG tablet Take 60mg  by mouth daily and add to the 120mg  05/29/19   Forrest Moron, MD  thyroid (ARMOUR) 180 MG tablet Take 180 mg by mouth daily.    [provider]  torsemide (DEMADEX) 20 MG tablet Take 20 mg by mouth 2 (two) times daily.  01/15/19   [provider]  warfarin (COUMADIN) 5 MG tablet Take 5 mg by mouth See admin instructions. Take 2.5mg  Mon, Wed, Fri and Sun. Take 5 MG Tuesday, Thursday and Saturday. Or as directed by cardiologist Take 5 mg by mouth 12/28/18   [provider]     Family History  Problem Relation Age of Onset  . Stroke Mother   . Cancer Father   . Hyperlipidemia Brother   . Healthy Daughter   .  Healthy Son   . Heart disease Maternal Grandmother   . Heart disease Maternal Grandfather     Social History   Socioeconomic History  . Marital status: Widowed    Spouse name: Not on file  . Number of children: Not on file  . Years of education: Not on file  . Highest education level: Not on file  Occupational History  . Not on file  Tobacco Use  . Smoking status: Never Smoker  . Smokeless tobacco: Never Used  Substance and Sexual Activity  . Alcohol use: Never  . Drug use: Never  . Sexual activity: Not Currently  Other Topics Concern  . Not on file  Social History Narrative   Lives at home with  duaghter   Social Determinants of Health   Financial Resource Strain:   . Difficulty of Paying Living Expenses: Not on file  Food Insecurity:   . Worried About Charity fundraiser in the Last Year: Not on file  . Ran Out of Food in the Last Year: Not on file  Transportation Needs:   . Lack of Transportation (Medical): Not on file  . Lack of Transportation (Non-Medical): Not on file  Physical Activity:   . Days of Exercise per Week: Not on file  . Minutes of Exercise per Session: Not on file  Stress:   . Feeling of Stress : Not on file  Social Connections:   . Frequency of Communication with Friends and Family: Not on file  . Frequency of Social Gatherings with Friends and Family: Not on file  . Attends Religious Services: Not on file  . Active Member of Clubs or Organizations: Not on file  . Attends Archivist Meetings: Not on file  . Marital Status: Not on file     Review of Systems  Constitutional: Negative for chills and fever.  Respiratory: Negative for chest tightness and shortness of breath.   Cardiovascular: Negative for chest pain.  Gastrointestinal: Positive for abdominal pain (RLQ ). Negative for nausea and vomiting.    Vital Signs: BP 132/67   Pulse (!) 101   Temp 98.1 F (36.7 C) (Oral)   Resp 17   Wt 163 lb 5.8 oz (74.1 kg)   SpO2 91%   BMI 31.90 kg/m   Physical Exam Cardiovascular:     Rate and Rhythm: Tachycardia present. Rhythm irregular.     Heart sounds: Normal heart sounds.  Pulmonary:     Effort: Pulmonary effort is normal.     Breath sounds: Normal breath sounds.  Abdominal:     General: Abdomen is flat. Bowel sounds are normal.     Palpations: Abdomen is soft.     Tenderness: There is abdominal tenderness (Upon palpation) in the right lower quadrant.  Skin:    General: Skin is warm and dry.  Neurological:     Mental Status: She is alert and oriented to person, place, and time.     Imaging: CT ABDOMEN PELVIS W  CONTRAST  Result Date: 05/29/2019 CLINICAL DATA:  Right lower quadrant pain. EXAM: CT ABDOMEN AND PELVIS WITH CONTRAST TECHNIQUE: Multidetector CT imaging of the abdomen and pelvis was performed using the standard protocol following bolus administration of intravenous contrast. CONTRAST:  25mL OMNIPAQUE IOHEXOL 300 MG/ML  SOLN COMPARISON:  No comparison studies available. FINDINGS: Lower chest: 2.6 cm nodular opacity identified posterior right lower lobe on image 10/series 4. Hepatobiliary: No suspicious focal abnormality within the liver parenchyma. There is no evidence for gallstones, gallbladder  wall thickening, or pericholecystic fluid. No intrahepatic or extrahepatic biliary dilation. Pancreas: No focal mass lesion. No dilatation of the main duct. No intraparenchymal cyst. No peripancreatic edema. Spleen: No splenomegaly. No focal mass lesion. Adrenals/Urinary Tract: Left adrenal thickening noted. Right adrenal gland unremarkable. 3.7 cm cystic lesion noted upper pole right kidney with potential septation or nodule posteriorly (26/2). Small exophytic cyst noted lower pole right kidney. Several cysts in the left kidney measure up to 3.4 cm. No evidence for hydroureter. The urinary bladder appears normal for the degree of distention. Stomach/Bowel: Stomach is unremarkable. No gastric wall thickening. No evidence of outlet obstruction. Duodenum is normally positioned as is the ligament of Treitz. No small bowel wall thickening. No small bowel dilatation. The terminal ileum is normal. Appendix is mildly dilated and fluid-filled measuring up to 19 mm diameter. This tracks posterior to the cecum and then lateral to the right abdominal wall where a complex multi septated 6.5 x 4.7 x 5.0 cm mass is identified. While this lesion does have an intraperitoneal component, it dissects through the abdominal wall into the dee p lateral abdominal wall muscles just anterior to the iliac crest. Diverticular changes are noted in  the left colon without diverticulitis. Vascular/Lymphatic: There is abdominal aortic atherosclerosis without aneurysm. There is no gastrohepatic or hepatoduodenal ligament lymphadenopathy. Small lymph nodes are seen in the right lower quadrant around the large complex cystic process and there is a low-density mildly enlarged lymph node in the central ileocolic mesentery measuring up to 12 mm in short axis diameter (image 45/series 2). No pelvic sidewall lymphadenopathy. Reproductive: The uterus is unremarkable.  There is no adnexal mass. Other: No intraperitoneal free fluid. Musculoskeletal: Bones are diffusely demineralized. IMPRESSION: 1. Markedly dilated thick-walled appendix tracks laterally to the lateral abdominal wall where a 7 x 5 x 5 cm complex multi septated rim enhancing collection is identified. This is most likely a complex abscess secondary to appendiceal rupture that is dissected into the abdominal wall. Less likely but not entirely excluded, necrotic neoplasm from primary appendiceal adenocarcinoma would be a possibility. Low-density mildly enlarged ileocolic node associated. No intraperitoneal free fluid. 2. Bilateral renal cysts including a 3.7 cm cyst in the upper pole right kidney with potential septation or posterior mural nodule. Nonemergent follow-up MRI in 3 months could be used to assess stability in further characterize. 3. 2.6 cm nodular opacity posterior right lower lobe may be scarring/rounded atelectasis, but close follow-up recommended to ensure stability as neoplasm cannot be excluded. 4.  Aortic Atherosclerois (ICD10-170.0) Electronically Signed   By: Misty Stanley M.D.   On: 05/29/2019 15:17   DG Chest Portable 1 View  Result Date: 05/29/2019 CLINICAL DATA:  Right lower quadrant pain for 1 month.  Hypoxia. EXAM: PORTABLE CHEST 1 VIEW COMPARISON:  None. FINDINGS: Patient rotated right. Apical lordotic positioning. Moderate cardiomegaly. Atherosclerosis in the transverse aorta.  Right paratracheal soft tissue fullness is likely due to prominent great vessels. Possible tiny left pleural effusion. Pulmonary interstitial prominence. Mild subsegmental atelectasis at the bases. IMPRESSION: Cardiomegaly with mild pulmonary interstitial prominence which could be chronic or represent mild pulmonary venous congestion. Possible tiny left pleural effusion with bibasilar subsegmental atelectasis. Aortic Atherosclerosis (ICD10-I70.0). Electronically Signed   By: Abigail Miyamoto M.D.   On: 05/29/2019 14:47    Labs:  CBC: Recent Labs    02/06/19 1039 05/29/19 1102 05/29/19 1223 05/30/19 0247  WBC 7.3 15.8* 15.2* 13.7*  HGB 15.7 13.8 13.7 12.9  HCT 48.4* 41.2* 42.9 39.1  PLT 179  --  271 263    COAGS: Recent Labs    04/22/19 0922 05/14/19 0920 05/29/19 1715 05/30/19 0247  INR 2.2 2.3 1.5* 1.5*  APTT  --   --  32  --     BMP: Recent Labs    02/06/19 1039 05/29/19 1223 05/30/19 0247  NA 144 136 141  K 3.7 3.5 3.4*  CL 96 92* 96*  CO2 29 32 33*  GLUCOSE 104* 117* 101*  BUN 39* 22 17  CALCIUM 9.7 8.9 8.6*  CREATININE 1.33* 1.11* 1.09*  GFRNONAA 35* 43* 44*  GFRAA 40* 50* 51*    LIVER FUNCTION TESTS: Recent Labs    02/06/19 1039 05/29/19 1223  BILITOT 0.6 1.0  AST 32 23  ALT 19 18  ALKPHOS 92 74  PROT 7.2 7.3  ALBUMIN 4.1 2.6*    TUMOR MARKERS: No results for input(s): AFPTM, CEA, CA199, CHROMGRNA in the last 8760 hours.  Assessment and Plan:  Appendiceal Abscess   Patient has been consulted by IR and will have appendiceal abscess drain placed today under CT-guidance. Images were reviewed by Dr. Anselm Pancoast. Risks, including infection, bleeding and bowel perforation, were all discussed with patient. Patient consented to procedure. Patient will be continued on IV zosyn and be closely followed in the hospital afterwards. Drain will be monitored for output.    Thank you for this interesting consult.  I greatly enjoyed meeting Valree Feild and look forward  to participating in their care.  A copy of this report was sent to the requesting provider on this date.  Electronically Signed: D. Rowe Robert, PA-C / Donnamarie Rossetti, PA-S 05/30/2019, 9:55 AM   I spent a total of 30 minutes in face to face in clinical consultation, greater than 50% of which was counseling/coordinating care for CT-guided appendiceal abscess drain

## 2019-05-30 NOTE — Progress Notes (Signed)
ANTICOAGULATION CONSULT NOTE - Initial Consult  Pharmacy Consult for warfarin Indication: atrial fibrillation  No Known Allergies  Patient Measurements: Weight: 163 lb 5.8 oz (74.1 kg)   Vital Signs: Temp: 98.1 F (36.7 C) (02/04 1147) Temp Source: Oral (02/04 1147) BP: 113/72 (02/04 1147) Pulse Rate: 101 (02/04 1147)  Labs: Recent Labs    05/29/19 1102 05/29/19 1223 05/29/19 1715 05/30/19 0247  HGB 13.8 13.7  --  12.9  HCT 41.2* 42.9  --  39.1  PLT  --  271  --  263  APTT  --   --  32  --   LABPROT  --   --  17.8* 17.8*  INR  --   --  1.5* 1.5*  CREATININE  --  1.11*  --  1.09*    Estimated Creatinine Clearance: 30.2 mL/min (A) (by C-G formula based on SCr of 1.09 mg/dL (H)).   Medical History: Past Medical History:  Diagnosis Date  . Arthritis   . Atrial fibrillation ([place])   . Cataract   . CHF (congestive heart failure) ([place])   . Hypertension   . Osteoporosis   . Thyroid disease    Assessment: 86 year old female s/p drain for appendiceal abscess. Patient with history of afib on warfarin, this was held on admit for surgery. INR is down to 1.5 this am. No bleeding complications noted post IR procedure.   Prior to admit warfarin regimen: 2.5mg  daily except 5mg  on TuThSa  Goal of Therapy:  INR 2-3 Monitor platelets by anticoagulation protocol: Yes   Plan:  Warfarin 5mg  tonight Daily INR for now  Erin Hearing PharmD., BCPS Clinical Pharmacist 05/30/2019 2:20 PM

## 2019-05-30 NOTE — Progress Notes (Addendum)
Patient in [place]. Notified Internal medicine at [phone]. Current BP 110/44 HR 68 RR 22 02 97 on 2L . Patient rates pain 0/10 on a pain scale of 1-10. Will continue to monitor

## 2019-05-30 NOTE — Progress Notes (Signed)
[facility] Progress Note     Subjective: CC: RLQ pain Patient reports she is still having RLQ pain but less than yesterday. She denies nausea, wants something to drink. +flatus but no BM. Patient reports she has never had a colonoscopy, she has had stool studies for screening but does not remember how many years ago.   Review of Systems  Constitutional: Negative for chills and fever.  Respiratory: Negative for shortness of breath.   Cardiovascular: Negative for chest pain.  Gastrointestinal: Positive for abdominal pain. Negative for constipation, diarrhea, nausea and vomiting.  Genitourinary: Negative for dysuria, frequency and urgency.    Objective: Vital signs in last 24 hours: Temp:  [98.1 F (36.7 C)-98.6 F (37 C)] 98.1 F (36.7 C) (02/04 0630) Pulse Rate:  [34-124] 124 (02/04 0630) Resp:  [16-27] 22 (02/03 1800) BP: (121-142)/(58-89) 130/72 (02/04 0630) SpO2:  [91 %-98 %] 91 % (02/04 0630) Weight:  [74.1 kg-74.4 kg] 74.1 kg (02/04 0630) Last BM Date: 05/29/19  Intake/Output from previous day: 02/03 0701 - 02/04 0700 In: 3 [I.V.:3] Out: 800 [Urine:800] Intake/Output this shift: No intake/output data recorded.  PE: General: pleasant, WD, WN white female who is laying in bed in NAD HEENT: Sclera are noninjected.  PERRL.  Ears and nose without any masses or lesions.  Mouth is pink and moist Heart: irregularly irregular. No obvious murmurs, gallops, or rubs noted.  Palpable radial and pedal pulses bilaterally Lungs: CTAB, no wheezes, rhonchi, or rales noted.  Respiratory effort nonlabored Abd: soft, ttp in RLQ without peritonitis, ND, +BS, no masses, hernias, or organomegaly MS: edema of BL LEs L>R, no obvious bony deformities Skin: warm and dry with no masses, lesions, or rashes Neuro: Cranial nerves 2-12 grossly intact, speech is normal Psych: A&Ox3 with an appropriate affect.   Lab Results:  Recent Labs    05/29/19 1223 05/30/19 0247  WBC 15.2*  13.7*  HGB 13.7 12.9  HCT 42.9 39.1  PLT 271 263   BMET Recent Labs    05/29/19 1223 05/30/19 0247  NA 136 141  K 3.5 3.4*  CL 92* 96*  CO2 32 33*  GLUCOSE 117* 101*  BUN 22 17  CREATININE 1.11* 1.09*  CALCIUM 8.9 8.6*   PT/INR Recent Labs    05/29/19 1715 05/30/19 0247  LABPROT 17.8* 17.8*  INR 1.5* 1.5*   CMP     Component Value Date/Time   NA 141 05/30/2019 0247   NA 144 02/06/2019 1039   K 3.4 (L) 05/30/2019 0247   CL 96 (L) 05/30/2019 0247   CO2 33 (H) 05/30/2019 0247   GLUCOSE 101 (H) 05/30/2019 0247   BUN 17 05/30/2019 0247   BUN 39 (H) 02/06/2019 1039   CREATININE 1.09 (H) 05/30/2019 0247   CALCIUM 8.6 (L) 05/30/2019 0247   PROT 7.3 05/29/2019 1223   PROT 7.2 02/06/2019 1039   ALBUMIN 2.6 (L) 05/29/2019 1223   ALBUMIN 4.1 02/06/2019 1039   AST 23 05/29/2019 1223   ALT 18 05/29/2019 1223   ALKPHOS 74 05/29/2019 1223   BILITOT 1.0 05/29/2019 1223   BILITOT 0.6 02/06/2019 1039   GFRNONAA 44 (L) 05/30/2019 0247   GFRAA 51 (L) 05/30/2019 0247   Lipase     Component Value Date/Time   LIPASE 23 05/29/2019 1223       Studies/Results: CT ABDOMEN PELVIS W CONTRAST  Result Date: 05/29/2019 CLINICAL DATA:  Right lower quadrant pain. EXAM: CT ABDOMEN AND PELVIS WITH CONTRAST TECHNIQUE: Multidetector CT imaging of  the abdomen and pelvis was performed using the standard protocol following bolus administration of intravenous contrast. CONTRAST:  66mL OMNIPAQUE IOHEXOL 300 MG/ML  SOLN COMPARISON:  No comparison studies available. FINDINGS: Lower chest: 2.6 cm nodular opacity identified posterior right lower lobe on image 10/series 4. Hepatobiliary: No suspicious focal abnormality within the liver parenchyma. There is no evidence for gallstones, gallbladder wall thickening, or pericholecystic fluid. No intrahepatic or extrahepatic biliary dilation. Pancreas: No focal mass lesion. No dilatation of the main duct. No intraparenchymal cyst. No peripancreatic edema.  Spleen: No splenomegaly. No focal mass lesion. Adrenals/Urinary Tract: Left adrenal thickening noted. Right adrenal gland unremarkable. 3.7 cm cystic lesion noted upper pole right kidney with potential septation or nodule posteriorly (26/2). Small exophytic cyst noted lower pole right kidney. Several cysts in the left kidney measure up to 3.4 cm. No evidence for hydroureter. The urinary bladder appears normal for the degree of distention. Stomach/Bowel: Stomach is unremarkable. No gastric wall thickening. No evidence of outlet obstruction. Duodenum is normally positioned as is the ligament of Treitz. No small bowel wall thickening. No small bowel dilatation. The terminal ileum is normal. Appendix is mildly dilated and fluid-filled measuring up to 19 mm diameter. This tracks posterior to the cecum and then lateral to the right abdominal wall where a complex multi septated 6.5 x 4.7 x 5.0 cm mass is identified. While this lesion does have an intraperitoneal component, it dissects through the abdominal wall into the dee p lateral abdominal wall muscles just anterior to the iliac crest. Diverticular changes are noted in the left colon without diverticulitis. Vascular/Lymphatic: There is abdominal aortic atherosclerosis without aneurysm. There is no gastrohepatic or hepatoduodenal ligament lymphadenopathy. Small lymph nodes are seen in the right lower quadrant around the large complex cystic process and there is a low-density mildly enlarged lymph node in the central ileocolic mesentery measuring up to 12 mm in short axis diameter (image 45/series 2). No pelvic sidewall lymphadenopathy. Reproductive: The uterus is unremarkable.  There is no adnexal mass. Other: No intraperitoneal free fluid. Musculoskeletal: Bones are diffusely demineralized. IMPRESSION: 1. Markedly dilated thick-walled appendix tracks laterally to the lateral abdominal wall where a 7 x 5 x 5 cm complex multi septated rim enhancing collection is  identified. This is most likely a complex abscess secondary to appendiceal rupture that is dissected into the abdominal wall. Less likely but not entirely excluded, necrotic neoplasm from primary appendiceal adenocarcinoma would be a possibility. Low-density mildly enlarged ileocolic node associated. No intraperitoneal free fluid. 2. Bilateral renal cysts including a 3.7 cm cyst in the upper pole right kidney with potential septation or posterior mural nodule. Nonemergent follow-up MRI in 3 months could be used to assess stability in further characterize. 3. 2.6 cm nodular opacity posterior right lower lobe may be scarring/rounded atelectasis, but close follow-up recommended to ensure stability as neoplasm cannot be excluded. 4.  Aortic Atherosclerois (ICD10-170.0) Electronically Signed   By: Misty Stanley M.D.   On: 05/29/2019 15:17   DG Chest Portable 1 View  Result Date: 05/29/2019 CLINICAL DATA:  Right lower quadrant pain for 1 month.  Hypoxia. EXAM: PORTABLE CHEST 1 VIEW COMPARISON:  None. FINDINGS: Patient rotated right. Apical lordotic positioning. Moderate cardiomegaly. Atherosclerosis in the transverse aorta. Right paratracheal soft tissue fullness is likely due to prominent great vessels. Possible tiny left pleural effusion. Pulmonary interstitial prominence. Mild subsegmental atelectasis at the bases. IMPRESSION: Cardiomegaly with mild pulmonary interstitial prominence which could be chronic or represent mild pulmonary venous congestion. Possible  tiny left pleural effusion with bibasilar subsegmental atelectasis. Aortic Atherosclerosis (ICD10-I70.0). Electronically Signed   By: Abigail Miyamoto M.D.   On: 05/29/2019 14:47    Anti-infectives: Anti-infectives (From admission, onward)   Start     Dose/Rate Route Frequency Ordered Stop   05/30/19 0000  piperacillin-tazobactam (ZOSYN) IVPB 3.375 g     3.375 g 12.5 mL/hr over 240 Minutes Intravenous Every 8 hours 05/29/19 1620     05/29/19 1630   piperacillin-tazobactam (ZOSYN) IVPB 3.375 g  Status:  Discontinued     3.375 g 100 mL/hr over 30 Minutes Intravenous Every 8 hours 05/29/19 1617 05/29/19 1618   05/29/19 1545  piperacillin-tazobactam (ZOSYN) IVPB 3.375 g     3.375 g 100 mL/hr over 30 Minutes Intravenous  Once 05/29/19 1538 05/29/19 1624       Assessment/Plan Permanent atrial fibrillation/flutter on coumadin CHF HTN Hypothyroidism  Perforated appendicitis with abscess 7 x 5 x 5 cm - Patient with suspect perforated appendicitis with abscess. No role for acute surgical intervention.  - planning for IR drain placement today, recommend sending for cytology as well - WBC 13, afebrile, clinically patient reports pain is a little less - ok to start CLD after IR procedure but would not recommend advancing past that  ID - zosyn VTE - SCDs, INR 1.5 this AM FEN - IVF, NPO Foley - none Follow up - TBD  LOS: 1 day    Brigid Re , [facility] 05/30/2019, 8:49 AM Please see Amion for pager number during day hours 7:00am-4:30pm

## 2019-05-31 ENCOUNTER — Ambulatory Visit: Payer: Medicare Other | Admitting: Cardiology

## 2019-05-31 LAB — BASIC METABOLIC PANEL
Anion gap: 13 (ref 5–15)
BUN: 16 mg/dL (ref 8–23)
CO2: 32 mmol/L (ref 22–32)
Calcium: 8.3 mg/dL — ABNORMAL LOW (ref 8.9–10.3)
Chloride: 94 mmol/L — ABNORMAL LOW (ref 98–111)
Creatinine, Ser: 0.97 mg/dL (ref 0.44–1.00)
GFR calc Af Amer: 59 mL/min — ABNORMAL LOW (ref >60)
GFR calc non Af Amer: 51 mL/min — ABNORMAL LOW (ref >60)
Glucose, Bld: 101 mg/dL — ABNORMAL HIGH (ref 70–99)
Potassium: 3.1 mmol/L — ABNORMAL LOW (ref 3.5–5.1)
Sodium: 139 mmol/L (ref 135–145)

## 2019-05-31 LAB — CBC
HCT: 36.9 % (ref 36.0–46.0)
Hemoglobin: 12.2 g/dL (ref 12.0–15.0)
MCH: 31 pg (ref 26.0–34.0)
MCHC: 33.1 g/dL (ref 30.0–36.0)
MCV: 93.9 fL (ref 80.0–100.0)
Platelets: 248 10*3/uL (ref 150–400)
RBC: 3.93 MIL/uL (ref 3.87–5.11)
RDW: 13.5 % (ref 11.5–15.5)
WBC: 12.1 10*3/uL — ABNORMAL HIGH (ref 4.0–10.5)
nRBC: 0 % (ref 0.0–0.2)

## 2019-05-31 LAB — PROTIME-INR
INR: 1.7 — ABNORMAL HIGH (ref 0.8–1.2)
Prothrombin Time: 19.8 seconds — ABNORMAL HIGH (ref 11.4–15.2)

## 2019-05-31 MED ORDER — WARFARIN SODIUM 5 MG PO TABS
5.0000 mg | ORAL_TABLET | Freq: Once | ORAL | Status: AC
  Administered 2019-05-31: 5 mg via ORAL
  Filled 2019-05-31: qty 1

## 2019-05-31 MED ORDER — POTASSIUM CHLORIDE 20 MEQ PO PACK
40.0000 meq | PACK | Freq: Two times a day (BID) | ORAL | Status: AC
  Administered 2019-05-31 (×2): 40 meq via ORAL
  Filled 2019-05-31 (×2): qty 2

## 2019-05-31 NOTE — Progress Notes (Signed)
[facility] Progress Note     Subjective: Patient reports no abdominal pain at rest, some pain with palpation in R side. Denies nausea, waiting on breakfast. She is hungry this AM. +flatus, no BM. Patient eager to mobilize.   Review of Systems  Constitutional: Negative for chills and fever.  Respiratory: Negative for shortness of breath.   Cardiovascular: Negative for chest pain.  Gastrointestinal: Positive for abdominal pain. Negative for constipation, diarrhea, nausea and vomiting.  Genitourinary: Negative for dysuria, frequency and urgency.   Objective: Vital signs in last 24 hours: Temp:  [97.4 F (36.3 C)-98.4 F (36.9 C)] 98.4 F (36.9 C) (02/05 0533) Pulse Rate:  [40-112] 80 (02/05 0533) Resp:  [17-24] 18 (02/04 1930) BP: (92-130)/(40-72) 115/53 (02/05 0533) SpO2:  [91 %-100 %] 94 % (02/05 0533) Weight:  [72.3 kg] 72.3 kg (02/05 0533) Last BM Date: 05/29/19  Intake/Output from previous day: 02/04 0701 - 02/05 0700 In: 795 [P.O.:720; I.V.:10; IV Piggyback:50] Out: 9892 [Urine:1200; Drains:55] Intake/Output this shift: No intake/output data recorded.  PE: General: pleasant, WD, WN white female who is laying in bed in NAD HEENT: Sclera are noninjected.  PERRL.  Ears and nose without any masses or lesions.  Mouth is pink and moist Heart: irregularly irregular. No obvious murmurs, gallops, or rubs noted.  Palpable radial and pedal pulses bilaterally Lungs: CTAB, no wheezes, rhonchi, or rales noted.  Respiratory effort nonlabored Abd: soft, ttp in RLQ without peritonitis, ND, +BS, no masses, hernias, or organomegaly MS: edema of BL LEs L>R, no obvious bony deformities Skin: warm and dry with no masses, lesions, or rashes Neuro: Cranial nerves 2-12 grossly intact, speech is normal Psych: A&Ox3 with an appropriate affect.  Lab Results:  Recent Labs    05/30/19 0247 05/31/19 0315  WBC 13.7* 12.1*  HGB 12.9 12.2  HCT 39.1 36.9  PLT 263 248    BMET Recent Labs    05/30/19 0247 05/31/19 0315  NA 141 139  K 3.4* 3.1*  CL 96* 94*  CO2 33* 32  GLUCOSE 101* 101*  BUN 17 16  CREATININE 1.09* 0.97  CALCIUM 8.6* 8.3*   PT/INR Recent Labs    05/30/19 0247 05/31/19 0315  LABPROT 17.8* 19.8*  INR 1.5* 1.7*   CMP     Component Value Date/Time   NA 139 05/31/2019 0315   NA 144 02/06/2019 1039   K 3.1 (L) 05/31/2019 0315   CL 94 (L) 05/31/2019 0315   CO2 32 05/31/2019 0315   GLUCOSE 101 (H) 05/31/2019 0315   BUN 16 05/31/2019 0315   BUN 39 (H) 02/06/2019 1039   CREATININE 0.97 05/31/2019 0315   CALCIUM 8.3 (L) 05/31/2019 0315   PROT 7.3 05/29/2019 1223   PROT 7.2 02/06/2019 1039   ALBUMIN 2.6 (L) 05/29/2019 1223   ALBUMIN 4.1 02/06/2019 1039   AST 23 05/29/2019 1223   ALT 18 05/29/2019 1223   ALKPHOS 74 05/29/2019 1223   BILITOT 1.0 05/29/2019 1223   BILITOT 0.6 02/06/2019 1039   GFRNONAA 51 (L) 05/31/2019 0315   GFRAA 59 (L) 05/31/2019 0315   Lipase     Component Value Date/Time   LIPASE 23 05/29/2019 1223       Studies/Results: CT ABDOMEN PELVIS W CONTRAST  Result Date: 05/29/2019 CLINICAL DATA:  Right lower quadrant pain. EXAM: CT ABDOMEN AND PELVIS WITH CONTRAST TECHNIQUE: Multidetector CT imaging of the abdomen and pelvis was performed using the standard protocol following bolus administration of intravenous contrast. CONTRAST:  34mL  OMNIPAQUE IOHEXOL 300 MG/ML  SOLN COMPARISON:  No comparison studies available. FINDINGS: Lower chest: 2.6 cm nodular opacity identified posterior right lower lobe on image 10/series 4. Hepatobiliary: No suspicious focal abnormality within the liver parenchyma. There is no evidence for gallstones, gallbladder wall thickening, or pericholecystic fluid. No intrahepatic or extrahepatic biliary dilation. Pancreas: No focal mass lesion. No dilatation of the main duct. No intraparenchymal cyst. No peripancreatic edema. Spleen: No splenomegaly. No focal mass lesion.  Adrenals/Urinary Tract: Left adrenal thickening noted. Right adrenal gland unremarkable. 3.7 cm cystic lesion noted upper pole right kidney with potential septation or nodule posteriorly (26/2). Small exophytic cyst noted lower pole right kidney. Several cysts in the left kidney measure up to 3.4 cm. No evidence for hydroureter. The urinary bladder appears normal for the degree of distention. Stomach/Bowel: Stomach is unremarkable. No gastric wall thickening. No evidence of outlet obstruction. Duodenum is normally positioned as is the ligament of Treitz. No small bowel wall thickening. No small bowel dilatation. The terminal ileum is normal. Appendix is mildly dilated and fluid-filled measuring up to 19 mm diameter. This tracks posterior to the cecum and then lateral to the right abdominal wall where a complex multi septated 6.5 x 4.7 x 5.0 cm mass is identified. While this lesion does have an intraperitoneal component, it dissects through the abdominal wall into the dee p lateral abdominal wall muscles just anterior to the iliac crest. Diverticular changes are noted in the left colon without diverticulitis. Vascular/Lymphatic: There is abdominal aortic atherosclerosis without aneurysm. There is no gastrohepatic or hepatoduodenal ligament lymphadenopathy. Small lymph nodes are seen in the right lower quadrant around the large complex cystic process and there is a low-density mildly enlarged lymph node in the central ileocolic mesentery measuring up to 12 mm in short axis diameter (image 45/series 2). No pelvic sidewall lymphadenopathy. Reproductive: The uterus is unremarkable.  There is no adnexal mass. Other: No intraperitoneal free fluid. Musculoskeletal: Bones are diffusely demineralized. IMPRESSION: 1. Markedly dilated thick-walled appendix tracks laterally to the lateral abdominal wall where a 7 x 5 x 5 cm complex multi septated rim enhancing collection is identified. This is most likely a complex abscess  secondary to appendiceal rupture that is dissected into the abdominal wall. Less likely but not entirely excluded, necrotic neoplasm from primary appendiceal adenocarcinoma would be a possibility. Low-density mildly enlarged ileocolic node associated. No intraperitoneal free fluid. 2. Bilateral renal cysts including a 3.7 cm cyst in the upper pole right kidney with potential septation or posterior mural nodule. Nonemergent follow-up MRI in 3 months could be used to assess stability in further characterize. 3. 2.6 cm nodular opacity posterior right lower lobe may be scarring/rounded atelectasis, but close follow-up recommended to ensure stability as neoplasm cannot be excluded. 4.  Aortic Atherosclerois (ICD10-170.0) Electronically Signed   By: Misty Stanley M.D.   On: 05/29/2019 15:17   DG Chest Portable 1 View  Result Date: 05/29/2019 CLINICAL DATA:  Right lower quadrant pain for 1 month.  Hypoxia. EXAM: PORTABLE CHEST 1 VIEW COMPARISON:  None. FINDINGS: Patient rotated right. Apical lordotic positioning. Moderate cardiomegaly. Atherosclerosis in the transverse aorta. Right paratracheal soft tissue fullness is likely due to prominent great vessels. Possible tiny left pleural effusion. Pulmonary interstitial prominence. Mild subsegmental atelectasis at the bases. IMPRESSION: Cardiomegaly with mild pulmonary interstitial prominence which could be chronic or represent mild pulmonary venous congestion. Possible tiny left pleural effusion with bibasilar subsegmental atelectasis. Aortic Atherosclerosis (ICD10-I70.0). Electronically Signed   By: Abigail Miyamoto  M.D.   On: 05/29/2019 14:47   CT IMAGE GUIDED DRAINAGE BY PERCUTANEOUS CATHETER  Result Date: 05/30/2019 INDICATION: 86 year old with appendicitis and periappendiceal abscess. EXAM: CT GUIDED DRAINAGE OF PERIAPPENDICEAL ABSCESS MEDICATIONS: Moderate sedation ANESTHESIA/SEDATION: 1.5 mg IV Versed 25 mcg IV Fentanyl Moderate Sedation Time:  17 minutes The patient  was continuously monitored during the procedure by the interventional radiology nurse under my direct supervision. COMPLICATIONS: None immediate. TECHNIQUE: Informed written consent was obtained from the patient after a thorough discussion of the procedural risks, benefits and alternatives. All questions were addressed. Maximal Sterile Barrier Technique was utilized including caps, mask, sterile gowns, sterile gloves, sterile drape, hand hygiene and skin antiseptic. A timeout was performed prior to the initiation of the procedure. PROCEDURE: Patient was placed supine on the CT scanner. Images through the lower abdomen were obtained. The abscess in the right lower abdomen was identified. The overlying skin was prepped and draped in sterile fashion. Skin was anesthetized with 1% lidocaine. Using CT guidance, 18 gauge trocar needle was directed into the abscess and yellow purulent fluid was aspirated. Stiff Amplatz wire was advanced into the collection. Tract was dilated to accommodate a 10.2 [place] multipurpose drain. 15 mL of yellow purulent fluid was removed. Catheter was sutured to skin and bandage was placed over the catheter. FINDINGS: Abscess in the right lower quadrant adjacent to the appendix. Drain was placed within the periappendiceal abscess. 15 mL of yellow purulent fluid was removed. IMPRESSION: CT-guided placement of a drain within the periappendiceal abscess. Electronically Signed   By: Markus Daft M.D.   On: 05/30/2019 13:46    Anti-infectives: Anti-infectives (From admission, onward)   Start     Dose/Rate Route Frequency Ordered Stop   05/30/19 0000  piperacillin-tazobactam (ZOSYN) IVPB 3.375 g     3.375 g 12.5 mL/hr over 240 Minutes Intravenous Every 8 hours 05/29/19 1620     05/29/19 1630  piperacillin-tazobactam (ZOSYN) IVPB 3.375 g  Status:  Discontinued     3.375 g 100 mL/hr over 30 Minutes Intravenous Every 8 hours 05/29/19 1617 05/29/19 1618   05/29/19 1545  piperacillin-tazobactam  (ZOSYN) IVPB 3.375 g     3.375 g 100 mL/hr over 30 Minutes Intravenous  Once 05/29/19 1538 05/29/19 1624       Assessment/Plan Permanent atrial fibrillation/flutter on coumadin CHF HTN Hypothyroidism  Perforated appendicitis with abscess7 x 5 x 5 cm -s/p IR drain placement 2/4 - WBC 12, afebrile, cxs with G negative rods still pending - still having RLQ pain but patient reports it is better, +flatus - ok to advance to soft diet  - patient will need follow up in IR drain clinic and CCS, although given age and co-morbidities she may not be a candidate for interval appendectomy  ID -zosyn VTE -SCDs, INR 1.7 this AM, ok to transition back to coumadin  FEN -soft diet Foley -none Follow up -TBD  LOS: 2 days    Brigid Re , [facility] 05/31/2019, 9:56 AM Please see Amion for pager number during day hours 7:00am-4:30pm

## 2019-05-31 NOTE — Evaluation (Signed)
Physical Therapy Evaluation Patient Details Name: Ashley Dunn MRN: [id number] DOB: Dec 22, 1925 Today's Date: 05/31/2019   History of Present Illness  Pt is a 86 y/o female admitted secondary to abdominal pain. Pt found to have acute appendicitis with abscess, now s/p CT guided drain placement. PMH including but not limited to a-fib, CHF and HTN.    Clinical Impression  Pt presented supine in bed with HOB elevated, awake and willing to participate in therapy session. Prior to admission, pt reported that she ambulated with use of a rollator and was independent with ADLs. Pt lives with her daughter in a single level house with one step to enter. She has 24/7 supervision/assistance from daughter and daughter's boyfriend. At the time of evaluation, pt moving relatively well with use of RW and min guard for safety. Pt first transferring to [facility] and had a small, very soft, unformed BM. Pt also with bleeding upon wiping which she attributed to hemorrhoids. Additionally, pt on RA throughout with SPO2 decreasing to 88% with activity and increasing to 93% in sitting position. Pt asymptomatic throughout. Pt's RN was notified. Per daughter, plan is to d/c home tomorrow. PT will continue to follow pt acutely to progress mobility as tolerated to ensure a safe d/c home.    Follow Up Recommendations No PT follow up;Supervision/Assistance - 24 hour    Equipment Recommendations  None recommended by PT    Recommendations for Other Services       Precautions / Restrictions Precautions Precautions: Fall Precaution Comments: jp drain R LQ abdomen Restrictions Weight Bearing Restrictions: No      Mobility  Bed Mobility Overal bed mobility: Needs Assistance Bed Mobility: Supine to Sit;Sit to Supine     Supine to sit: Supervision Sit to supine: Supervision   General bed mobility comments: for safety and line management  Transfers Overall transfer level: Needs assistance Equipment used: Rolling walker (2  wheeled);None Transfers: Sit to/from [employer] to Stand: Min guard Stand pivot transfers: Min assist       General transfer comment: pt initially performing sit<>stand and stand-pivot to [facility] without use of RW, min A for safety and stability; pt then performing sit<>stand with RW and min guard for safety, no physical assistance needed  Ambulation/Gait Ambulation/Gait assistance: Min guard Gait Distance (Feet): 30 Feet Assistive device: Rolling walker (2 wheeled) Gait Pattern/deviations: Step-through pattern;Decreased stride length Gait velocity: WFL   General Gait Details: pt overall steady wiht RW, min guard for safety and line management  Stairs            Wheelchair Mobility    Modified Rankin (Stroke Patients Only)       Balance Overall balance assessment: Needs assistance Sitting-balance support: Feet supported Sitting balance-Leahy Scale: Good     Standing balance support: No upper extremity supported;Single extremity supported;Bilateral upper extremity supported Standing balance-Leahy Scale: Poor                               Pertinent Vitals/Pain Pain Assessment: No/denies pain    Home Living Family/patient expects to be discharged to:: Private residence Living Arrangements: Children Available Help at Discharge: Family;Available 24 hours/day Type of Home: House Home Access: Stairs to enter   [employer] of Steps: 1 Home Layout: One level Home Equipment: Walker - 4 wheels;Cane - single point      Prior Function Level of Independence: Independent with assistive device(s)  Comments: ambulates with use of a rollator     Hand Dominance        Extremity/Trunk Assessment   Upper Extremity Assessment Upper Extremity Assessment: Overall WFL for tasks assessed    Lower Extremity Assessment Lower Extremity Assessment: Overall WFL for tasks assessed       Communication   Communication:  HOH  Cognition Arousal/Alertness: Awake/alert Behavior During Therapy: WFL for tasks assessed/performed Overall Cognitive Status: Impaired/Different from baseline Area of Impairment: Safety/judgement                         Safety/Judgement: Decreased awareness of safety            General Comments      Exercises     Assessment/Plan    PT Assessment Patient needs continued PT services  PT Problem List Decreased balance;Decreased mobility;Decreased coordination;Decreased safety awareness       PT Treatment Interventions DME instruction;Gait training;Stair training;Functional mobility training;Therapeutic exercise;Therapeutic activities;Balance training;Neuromuscular re-education;Patient/family education    PT Goals (Current goals can be found in the Care Plan section)  Acute Rehab PT Goals Patient Stated Goal: "home tomorrow" PT Goal Formulation: With patient/family Time For Goal Achievement: 06/14/19 Potential to Achieve Goals: Good    Frequency Min 3X/week   Barriers to discharge        Co-evaluation               AM-PAC PT "6 Clicks" Mobility  Outcome Measure Help needed turning from your back to your side while in a flat bed without using bedrails?: None Help needed moving from lying on your back to sitting on the side of a flat bed without using bedrails?: None Help needed moving to and from a bed to a chair (including a wheelchair)?: A Little Help needed standing up from a chair using your arms (e.g., wheelchair or bedside chair)?: A Little Help needed to walk in hospital room?: A Little Help needed climbing 3-5 steps with a railing? : A Little 6 Click Score: 20    End of Session   Activity Tolerance: Patient tolerated treatment well Patient left: in bed;with call bell/phone within reach;with family/visitor present Nurse Communication: Mobility status;Other (comment)(SPO2 fluctuating between 88% and 93% on RA) PT Visit Diagnosis: Other  abnormalities of gait and mobility (R26.89)    Time: 7654-6503 PT Time Calculation (min) (ACUTE ONLY): 27 min   Charges:   PT Evaluation $PT Eval Moderate Complexity: 1 Mod PT Treatments $Therapeutic Activity: 8-22 mins        Anastasio Champion, DPT  Acute Rehabilitation Services Pager [phone] Office [place] 05/31/2019, 4:09 PM

## 2019-05-31 NOTE — Progress Notes (Signed)
Subjective: HD#2 Patient with IR guided drain placement yesterday, no acute events reported overnight.  This morning, patient evaluated at bedside. She notes she is doing well and denies any pain at this time. All questions and concerns addressed.   Objective:  Vital signs in last 24 hours: Vitals:   05/30/19 1930 05/30/19 2024 05/30/19 2044 05/31/19 0533  BP: 130/66   (!) 115/53  Pulse: 80 68  80  Resp: 18     Temp:   98.3 F (36.8 C) 98.4 F (36.9 C)  TempSrc:   Oral Oral  SpO2: 91%   94%  Weight:    72.3 kg   CBC Latest Ref Rng & Units 05/31/2019 05/30/2019 05/29/2019  WBC 4.0 - 10.5 K/uL 12.1(H) 13.7(H) 15.2(H)  Hemoglobin 12.0 - 15.0 g/dL 12.2 12.9 13.7  Hematocrit 36.0 - 46.0 % 36.9 39.1 42.9  Platelets 150 - 400 K/uL 248 263 271   BMP Latest Ref Rng & Units 05/31/2019 05/30/2019 05/29/2019  Glucose 70 - 99 mg/dL 101(H) 101(H) 117(H)  BUN 8 - 23 mg/dL 16 17 22   Creatinine 0.44 - 1.00 mg/dL 0.97 1.09(H) 1.11(H)  BUN/Creat Ratio 12 - 28 - - -  Sodium 135 - 145 mmol/L 139 141 136  Potassium 3.5 - 5.1 mmol/L 3.1(L) 3.4(L) 3.5  Chloride 98 - 111 mmol/L 94(L) 96(L) 92(L)  CO2 22 - 32 mmol/L 32 33(H) 32  Calcium 8.9 - 10.3 mg/dL 8.3(L) 8.6(L) 8.9   Physical Exam  Constitutional: She is oriented to person, place, and time and well-developed, well-nourished, and in no distress.  Cardiovascular: Normal rate, S1 normal, S2 normal and intact distal pulses. An irregularly irregular rhythm present.  No murmur heard. Pulmonary/Chest: Breath sounds normal. No respiratory distress. She has no wheezes. She has no rales.  Abdominal: Soft. Bowel sounds are normal. She exhibits no distension. There is no abdominal tenderness.  Drain in RLQ w/dressing appears clear and dry without surrounding erythema draining ~20cc bloody fluid in drain  Musculoskeletal:        General: No tenderness. Normal range of motion.  Neurological: She is alert and oriented to person, place, and time.  Skin: Skin is  warm and dry.   Assessment/Plan: Ms. Ashley Dunn is a 86year old community dwelling female with PMHx of atrial fibrillation on warfarin, hypertension, hypothyroidism, and congestive heart failure presenting with one month history of intermittent RLQ abdominal pain and imaging concerning for complex abscess secondary to appendiceal rupture into abdominal wall.   Perforated appendicitis with large abscess Patient with one month history of intermittent RLQ abdominal pain with tenderness to palpation of McBurney point. CT concerning for appendiceal rupture w/complex abscess in lateral abdominal wall vs necrotic neoplasm from primary appendiceal adenocarcinoma. Given her significant leukocytosis and clinical presentation, more consistent with abscess 2/2 perforated appendicitis. Patient s/p CT guided drain placement on 2/4 with ~35cc output overnight. She is doing well this morning and denies any pain. Remains afebrile with improving leukocytosis. On examination, minimal tenderness at RLQ and drain is in place with ~20cc of bloody fluid. Specimen with gram negative rods. She is continued on IV zosyn.  - Continue IV zosyn 3.375mg  q8h (day 3) - F/u abscess cultures  - Continue drain per IR, patient will need to f/u with IR in clinic for removal in few weeks - CBC daily - PT/OT evaluation  Persistent atrial fibrillation:  History of A.fib with CHADSVASC2 of 5 on chronic warfarin therapy. Rate controlled with diltiazem and metoprolol at home.  -  Continue diltiazem 240mg  bid - Continue metoprolol 25mg  qd+50mg  qHS - Continue warfarin per schedule (5 mg on TThSat, 2.5mg  MWFSun) - INR/PT monitoring  - Continuous cardiac monitoring  Hypothyroidism: - Continue armour thyroid 180mg  qd   CHF:  Currently appears euvolemic on exam.  - Continue metoprolol 25mg  qd + 50mg  qHS - Torsemide 20mg  bid  Hypertension - Resume home medications as above  FEN/GI:  Diet: soft diet, advance as tolerated  Fluids:  None   VTE Prophylaxis: SCDs, Warfarin Code: DNR/DNI  Prior to Admission Living Arrangement: Home  Anticipated Discharge Location: [place] [facility] vs SNF Barriers to Discharge: ongoing medical work up Dispo: Anticipated discharge pending clinical improvement.   Harvie Heck, MD Internal Medicine, PGY-1 Pager # [phone] 05/31/19   6:44 AM

## 2019-05-31 NOTE — Progress Notes (Signed)
Referring Physician(s): Dr. Georgette Dover  Supervising Physician: Aletta Edouard  Patient Status:  Ashley Dunn - In-pt  Chief Complaint: Follow up abdominal abscess drain placed 2/4 by Dr. Anselm Pancoast  Subjective:  Patient laying in bed, she tells me she's trying to order lunch however she cannot get the phone to work - upon further inspection she is attempting to use the bed control remote as a phone, she was given the room phone during my exam. She denies any complaints today, her abdominal pain has improved very much and she is very happy that her diet was changed - she tells me she had two hard boiled eggs, a chicken sausage which she did not like very much and some coffee this morning. She is wondering if the drain is supposed to be bloody.   Allergies: Patient has no known allergies.  Medications: Prior to Admission medications   Medication Sig Start Date End Date Taking? Authorizing Provider  diltiazem (CARDIZEM CD) 240 MG 24 hr capsule Take 240 mg by mouth 2 (two) times daily.  12/28/18  Yes [provider]  metoprolol succinate (TOPROL-XL) 25 MG 24 hr tablet Take 1 tablet (25 mg total) by mouth daily. Patient taking differently: Take 25 mg by mouth daily. Take with 50mg  for a total of 75mg  05/14/19  Yes Miquel Dunn, NP  metoprolol succinate (TOPROL-XL) 50 MG 24 hr tablet Take 50 mg by mouth daily. Take with 25mg  for a total of 75mg  01/21/19  Yes [provider]  thyroid (ARMOUR) 180 MG tablet Take 180 mg by mouth daily.   Yes [provider]  torsemide (DEMADEX) 20 MG tablet Take 20 mg by mouth 2 (two) times daily.  01/15/19  Yes [provider]  warfarin (COUMADIN) 5 MG tablet Take 2.5-5 mg by mouth See admin instructions. Take 2.5mg  Mon, Wed, Fri and Sun after supper. Take 5 MG Tuesday, Thursday and Saturday after supper. Or as directed by cardiologist Take 5 mg by mouth 12/28/18  Yes [provider]  thyroid (ARMOUR THYROID) 120 MG tablet Take 1  tablet (120 mg total) by mouth daily before breakfast. Patient not taking: Reported on 05/30/2019 05/14/19   Forrest Moron, MD  thyroid [facility] THYROID) 60 MG tablet Take 60mg  by mouth daily and add to the 120mg  Patient not taking: Reported on 05/30/2019 05/29/19   Forrest Moron, MD     Vital Signs: BP (!) 115/53 (BP Location: Left Arm)   Pulse 80   Temp 98.4 F (36.9 C) (Oral)   Resp 18   Wt 159 lb 8 oz (72.3 kg)   SpO2 94%   BMI 31.15 kg/m   Physical Exam Vitals and nursing note reviewed.  Constitutional:      General: She is not in acute distress. HENT:     Head: Normocephalic.  Cardiovascular:     Rate and Rhythm: Normal rate and regular rhythm.  Pulmonary:     Effort: Pulmonary effort is normal.     Breath sounds: Normal breath sounds.  Abdominal:     General: There is no distension.     Palpations: Abdomen is soft.     Tenderness: There is no abdominal tenderness.     Comments: (+) RLQ drain to suction with ~10 cc thick bloody output. Flushes easily. Insertion site clean, dry, dressed appropriately. Suture and stat lock in tact. No active bleeding or leakage from insertion site.   Skin:    General: Skin is warm and dry.  Neurological:  Mental Status: She is alert. Mental status is at baseline.     Imaging: CT ABDOMEN PELVIS W CONTRAST  Result Date: 05/29/2019 CLINICAL DATA:  Right lower quadrant pain. EXAM: CT ABDOMEN AND PELVIS WITH CONTRAST TECHNIQUE: Multidetector CT imaging of the abdomen and pelvis was performed using the standard protocol following bolus administration of intravenous contrast. CONTRAST:  73mL OMNIPAQUE IOHEXOL 300 MG/ML  SOLN COMPARISON:  No comparison studies available. FINDINGS: Lower chest: 2.6 cm nodular opacity identified posterior right lower lobe on image 10/series 4. Hepatobiliary: No suspicious focal abnormality within the liver parenchyma. There is no evidence for gallstones, gallbladder wall thickening, or pericholecystic fluid. No  intrahepatic or extrahepatic biliary dilation. Pancreas: No focal mass lesion. No dilatation of the main duct. No intraparenchymal cyst. No peripancreatic edema. Spleen: No splenomegaly. No focal mass lesion. Adrenals/Urinary Tract: Left adrenal thickening noted. Right adrenal gland unremarkable. 3.7 cm cystic lesion noted upper pole right kidney with potential septation or nodule posteriorly (26/2). Small exophytic cyst noted lower pole right kidney. Several cysts in the left kidney measure up to 3.4 cm. No evidence for hydroureter. The urinary bladder appears normal for the degree of distention. Stomach/Bowel: Stomach is unremarkable. No gastric wall thickening. No evidence of outlet obstruction. Duodenum is normally positioned as is the ligament of Treitz. No small bowel wall thickening. No small bowel dilatation. The terminal ileum is normal. Appendix is mildly dilated and fluid-filled measuring up to 19 mm diameter. This tracks posterior to the cecum and then lateral to the right abdominal wall where a complex multi septated 6.5 x 4.7 x 5.0 cm mass is identified. While this lesion does have an intraperitoneal component, it dissects through the abdominal wall into the dee p lateral abdominal wall muscles just anterior to the iliac crest. Diverticular changes are noted in the left colon without diverticulitis. Vascular/Lymphatic: There is abdominal aortic atherosclerosis without aneurysm. There is no gastrohepatic or hepatoduodenal ligament lymphadenopathy. Small lymph nodes are seen in the right lower quadrant around the large complex cystic process and there is a low-density mildly enlarged lymph node in the central ileocolic mesentery measuring up to 12 mm in short axis diameter (image 45/series 2). No pelvic sidewall lymphadenopathy. Reproductive: The uterus is unremarkable.  There is no adnexal mass. Other: No intraperitoneal free fluid. Musculoskeletal: Bones are diffusely demineralized. IMPRESSION: 1.  Markedly dilated thick-walled appendix tracks laterally to the lateral abdominal wall where a 7 x 5 x 5 cm complex multi septated rim enhancing collection is identified. This is most likely a complex abscess secondary to appendiceal rupture that is dissected into the abdominal wall. Less likely but not entirely excluded, necrotic neoplasm from primary appendiceal adenocarcinoma would be a possibility. Low-density mildly enlarged ileocolic node associated. No intraperitoneal free fluid. 2. Bilateral renal cysts including a 3.7 cm cyst in the upper pole right kidney with potential septation or posterior mural nodule. Nonemergent follow-up MRI in 3 months could be used to assess stability in further characterize. 3. 2.6 cm nodular opacity posterior right lower lobe may be scarring/rounded atelectasis, but close follow-up recommended to ensure stability as neoplasm cannot be excluded. 4.  Aortic Atherosclerois (ICD10-170.0) Electronically Signed   By: Misty Stanley M.D.   On: 05/29/2019 15:17   DG Chest Portable 1 View  Result Date: 05/29/2019 CLINICAL DATA:  Right lower quadrant pain for 1 month.  Hypoxia. EXAM: PORTABLE CHEST 1 VIEW COMPARISON:  None. FINDINGS: Patient rotated right. Apical lordotic positioning. Moderate cardiomegaly. Atherosclerosis in the transverse aorta. Right  paratracheal soft tissue fullness is likely due to prominent great vessels. Possible tiny left pleural effusion. Pulmonary interstitial prominence. Mild subsegmental atelectasis at the bases. IMPRESSION: Cardiomegaly with mild pulmonary interstitial prominence which could be chronic or represent mild pulmonary venous congestion. Possible tiny left pleural effusion with bibasilar subsegmental atelectasis. Aortic Atherosclerosis (ICD10-I70.0). Electronically Signed   By: Abigail Miyamoto M.D.   On: 05/29/2019 14:47   CT IMAGE GUIDED DRAINAGE BY PERCUTANEOUS CATHETER  Result Date: 05/30/2019 INDICATION: 86 year old with appendicitis and  periappendiceal abscess. EXAM: CT GUIDED DRAINAGE OF PERIAPPENDICEAL ABSCESS MEDICATIONS: Moderate sedation ANESTHESIA/SEDATION: 1.5 mg IV Versed 25 mcg IV Fentanyl Moderate Sedation Time:  17 minutes The patient was continuously monitored during the procedure by the interventional radiology nurse under my direct supervision. COMPLICATIONS: None immediate. TECHNIQUE: Informed written consent was obtained from the patient after a thorough discussion of the procedural risks, benefits and alternatives. All questions were addressed. Maximal Sterile Barrier Technique was utilized including caps, mask, sterile gowns, sterile gloves, sterile drape, hand hygiene and skin antiseptic. A timeout was performed prior to the initiation of the procedure. PROCEDURE: Patient was placed supine on the CT scanner. Images through the lower abdomen were obtained. The abscess in the right lower abdomen was identified. The overlying skin was prepped and draped in sterile fashion. Skin was anesthetized with 1% lidocaine. Using CT guidance, 18 gauge trocar needle was directed into the abscess and yellow purulent fluid was aspirated. Stiff Amplatz wire was advanced into the collection. Tract was dilated to accommodate a 10.2 [place] multipurpose drain. 15 mL of yellow purulent fluid was removed. Catheter was sutured to skin and bandage was placed over the catheter. FINDINGS: Abscess in the right lower quadrant adjacent to the appendix. Drain was placed within the periappendiceal abscess. 15 mL of yellow purulent fluid was removed. IMPRESSION: CT-guided placement of a drain within the periappendiceal abscess. Electronically Signed   By: Markus Daft M.D.   On: 05/30/2019 13:46    Labs:  CBC: Recent Labs    02/06/19 1039 05/29/19 1102 05/29/19 1223 05/30/19 0247 05/31/19 0315  WBC 7.3 15.8* 15.2* 13.7* 12.1*  HGB 15.7 13.8 13.7 12.9 12.2  HCT 48.4* 41.2* 42.9 39.1 36.9  PLT 179  --  271 263 248    COAGS: Recent Labs     05/14/19 0920 05/29/19 1715 05/30/19 0247 05/31/19 0315  INR 2.3 1.5* 1.5* 1.7*  APTT  --  32  --   --     BMP: Recent Labs    02/06/19 1039 05/29/19 1223 05/30/19 0247 05/31/19 0315  NA 144 136 141 139  K 3.7 3.5 3.4* 3.1*  CL 96 92* 96* 94*  CO2 29 32 33* 32  GLUCOSE 104* 117* 101* 101*  BUN 39* 22 17 16   CALCIUM 9.7 8.9 8.6* 8.3*  CREATININE 1.33* 1.11* 1.09* 0.97  GFRNONAA 35* 43* 44* 51*  GFRAA 40* 50* 51* 59*    LIVER FUNCTION TESTS: Recent Labs    02/06/19 1039 05/29/19 1223  BILITOT 0.6 1.0  AST 32 23  ALT 19 18  ALKPHOS 92 74  PROT 7.2 7.3  ALBUMIN 4.1 2.6*    Assessment and Plan:  86 y/o F s/p RLQ abscess drain placed 2/4 by Dr. Anselm Pancoast seen today for drain follow up.  She reports her pain has essentially resolved and she is tolerating a diet of solid foods well per her report, she denies any bowel movements so far today. Per I/O 55 cc output since placement,  output appears thick and bloody on exam today with ~10 cc output in suction bulb - H/H stable at 12.2/36.9, INR slightly elevated at 1.7 today however she was recently restarted on her coumadin.Preliminary culture shows abundant proteus mirabilis. Drain flushes easily on my exam, insertion site unremarkable.   Continue TID flushes with 5 cc NS, record output Qshift, dressing changes QD or PRN when soiled, call IR if drain is difficult to flush.   IR will continue to follow - please call with questions or concerns.   Electronically Signed: Joaquim Nam, PA-C 05/31/2019, 1:04 PM   I spent a total of 15 Minutes at the the patient's bedside AND on the patient's hospital floor or unit, greater than 50% of which was counseling/coordinating care for RLQ abscess drain follow up.

## 2019-05-31 NOTE — Progress Notes (Signed)
Internal Medicine Attending:   I saw and examined the patient. I reviewed the resident's note and I agree with the resident's findings and plan as documented in the resident's note.  Patient states that she feels better today and denies any new complaints.  She denies any pain currently.  Patient initially been to the hospital with perforated appendicitis with a large right lower quadrant abscess.  She is status post CT-guided drain placement on February 4 by IR.  She continues to have good output from this.  We will continue with IV Zosyn for now.  Patient is afebrile and leukocytosis continues to improve.  Abscess culture currently growing out Proteus.  Will follow up sensitivities and consider transition to oral antibiotics.  Surgery follow-up and recommendations appreciated.  Patient is likely not a candidate for interval appendectomy.  Once the drain is removed she should consider colonoscopy to rule out a malignancy if she would consider surgery.  We will follow PT/OT evaluation.  No further work-up at this time.  I suspect patient will be stable for DC home over the weekend.

## 2019-05-31 NOTE — Progress Notes (Signed)
ANTICOAGULATION CONSULT NOTE Pharmacy Consult for warfarin Indication: atrial fibrillation  No Known Allergies  Patient Measurements: Weight: 159 lb 8 oz (72.3 kg)   Vital Signs: Temp: 98.4 F (36.9 C) (02/05 0533) Temp Source: Oral (02/05 0533) BP: 115/53 (02/05 0533) Pulse Rate: 80 (02/05 0533)  Labs: Recent Labs    05/29/19 1223 05/29/19 1223 05/29/19 1715 05/30/19 0247 05/31/19 0315  HGB 13.7   < >  --  12.9 12.2  HCT 42.9  --   --  39.1 36.9  PLT 271  --   --  263 248  APTT  --   --  32  --   --   LABPROT  --   --  17.8* 17.8* 19.8*  INR  --   --  1.5* 1.5* 1.7*  CREATININE 1.11*  --   --  1.09* 0.97   < > = values in this interval not displayed.    Estimated Creatinine Clearance: 33.5 mL/min (by C-G formula based on SCr of 0.97 mg/dL).   Medical History: Past Medical History:  Diagnosis Date  . Arthritis   . Atrial fibrillation ([place])   . Cataract   . CHF (congestive heart failure) ([place])   . Hypertension   . Osteoporosis   . Thyroid disease    Assessment: 86 year old female s/p drain for appendiceal abscess. Patient with history of afib on warfarin, this was held on admit for surgery. No bleeding complications noted post IR procedure.   Prior to admit warfarin regimen: 2.5mg  daily except 5mg  on TuThSa  Post drain placement 2/4, no immediate plans for further surgery noted. Warfarin resumed last night, INR trending up to 1.7. CBC stable.   Goal of Therapy:  INR 2-3 Monitor platelets by anticoagulation protocol: Yes   Plan:  Repeat 5mg  of warfarin tonight with expectation of transitioning back to home dose in am Daily INR for now  Erin Hearing PharmD., BCPS Clinical Pharmacist 05/31/2019 11:37 AM

## 2019-06-01 LAB — CBC
HCT: 37.4 % (ref 36.0–46.0)
Hemoglobin: 12.2 g/dL (ref 12.0–15.0)
MCH: 31.3 pg (ref 26.0–34.0)
MCHC: 32.6 g/dL (ref 30.0–36.0)
MCV: 95.9 fL (ref 80.0–100.0)
Platelets: 259 10*3/uL (ref 150–400)
RBC: 3.9 MIL/uL (ref 3.87–5.11)
RDW: 13.5 % (ref 11.5–15.5)
WBC: 9.5 10*3/uL (ref 4.0–10.5)
nRBC: 0 % (ref 0.0–0.2)

## 2019-06-01 LAB — BASIC METABOLIC PANEL
Anion gap: 8 (ref 5–15)
BUN: 22 mg/dL (ref 8–23)
CO2: 33 mmol/L — ABNORMAL HIGH (ref 22–32)
Calcium: 8.4 mg/dL — ABNORMAL LOW (ref 8.9–10.3)
Chloride: 100 mmol/L (ref 98–111)
Creatinine, Ser: 1.19 mg/dL — ABNORMAL HIGH (ref 0.44–1.00)
GFR calc Af Amer: 46 mL/min — ABNORMAL LOW (ref >60)
GFR calc non Af Amer: 40 mL/min — ABNORMAL LOW (ref >60)
Glucose, Bld: 114 mg/dL — ABNORMAL HIGH (ref 70–99)
Potassium: 3.8 mmol/L (ref 3.5–5.1)
Sodium: 141 mmol/L (ref 135–145)

## 2019-06-01 LAB — PROTIME-INR
INR: 2 — ABNORMAL HIGH (ref 0.8–1.2)
Prothrombin Time: 22.3 seconds — ABNORMAL HIGH (ref 11.4–15.2)

## 2019-06-01 MED ORDER — WARFARIN SODIUM 5 MG PO TABS
5.0000 mg | ORAL_TABLET | Freq: Once | ORAL | Status: AC
  Administered 2019-06-01: 5 mg via ORAL
  Filled 2019-06-01: qty 1

## 2019-06-01 MED ORDER — AMOXICILLIN-POT CLAVULANATE 500-125 MG PO TABS
500.0000 mg | ORAL_TABLET | Freq: Two times a day (BID) | ORAL | Status: DC
  Administered 2019-06-01 – 2019-06-02 (×2): 500 mg via ORAL
  Filled 2019-06-01 (×3): qty 1

## 2019-06-01 MED ORDER — LOPERAMIDE HCL 2 MG PO CAPS
4.0000 mg | ORAL_CAPSULE | ORAL | Status: DC | PRN
  Administered 2019-06-01: 4 mg via ORAL
  Filled 2019-06-01: qty 2

## 2019-06-01 NOTE — Progress Notes (Signed)
ANTICOAGULATION CONSULT NOTE Pharmacy Consult for warfarin Indication: atrial fibrillation  No Known Allergies  Patient Measurements: Weight: 161 lb 1.6 oz (73.1 kg)   Vital Signs: Temp: 97.3 F (36.3 C) (02/06 0516) Temp Source: Oral (02/06 0516) BP: 131/79 (02/06 0516) Pulse Rate: 92 (02/06 0516)  Labs: Recent Labs    05/29/19 1715 05/29/19 1715 05/30/19 0247 05/30/19 0247 05/31/19 0315 06/01/19 0327  HGB  --    < > 12.9   < > 12.2 12.2  HCT  --    < > 39.1  --  36.9 37.4  PLT  --    < > 263  --  248 259  APTT 32  --   --   --   --   --   LABPROT 17.8*   < > 17.8*  --  19.8* 22.3*  INR 1.5*   < > 1.5*  --  1.7* 2.0*  CREATININE  --    < > 1.09*  --  0.97 1.19*   < > = values in this interval not displayed.    Estimated Creatinine Clearance: 27.5 mL/min (A) (by C-G formula based on SCr of 1.19 mg/dL (H)).   Medical History: Past Medical History:  Diagnosis Date  . Arthritis   . Atrial fibrillation ([place])   . Cataract   . CHF (congestive heart failure) ([place])   . Hypertension   . Osteoporosis   . Thyroid disease    Assessment: 86 year old female s/p drain for appendiceal abscess. Patient with history of afib on warfarin, this was held on admit for surgery. No bleeding complications noted post IR procedure.   Prior to admit warfarin regimen: 2.5mg  daily except 5mg  on TuThSa  Post drain placement 2/4, no immediate plans for further surgery noted. Warfarin resumed 2/4, INR now therapeutic at 2 with higher than PTA dose on 2/5. CBC stable. No documented s/sx of bleeding.   Goal of Therapy:  INR 2-3 Monitor platelets by anticoagulation protocol: Yes   Plan:  Warfarin 5 mg tonight - as per PTA regimen Daily INR for now  Antonietta Jewel, PharmD, [place] Pharmacist  Phone: [phone]  Please check AMION for all [place] phone numbers After 10:00 PM, call [place] [phone] 06/01/2019 10:23 AM

## 2019-06-01 NOTE — Progress Notes (Signed)
   Subjective: Patient reports that she is feeling well today, states that she is hungry.  She denies any abdominal pain, nausea, vomiting, fevers, or chills.  All questions and concerns addressed.  Objective:  Vital signs in last 24 hours: Vitals:   05/31/19 2131 05/31/19 2133 06/01/19 0515 06/01/19 0516  BP:   131/79 131/79  Pulse:   (!) 41 92  Resp:  15 17 18   Temp:    (!) 97.3 F (36.3 C)  TempSrc:    Oral  SpO2: 93% 97% (!) 88% 91%  Weight:    73.1 kg   General: Elderly female, no acute distress, lying in bed Cardiac: Irregular rate and rhythm, no murmurs rubs or gallops Pulmonary: CTA BL, no wheezing, rhonchi, rales Abdomen: Soft, nontender, nondistended, normoactive bowel sounds, right lower quadrant drain in place, with minimal bloody output (<5cc)  Assessment/Plan:  Active Problems:   Appendiceal abscess  This is a 86 year old female with history of atrial fibrillation on warfarin, hypertension, hypothyroidism, and CHF who presented with a 1 month history of right lower quadrant abdominal pain.  Noted to have a appendiceal abscess.  Appendiceal abscess:  Patient is currently afebrile with no leukocytosis, vital signs are stable.  Status post CT guided drain placement on 2/4.  Cultures grew Proteus mirabilis, sensitivities still pending.  Currently on IV Zosyn.  Drain output of approximately 14 mL overnight.  Overall appears to be doing well.  Surgery is following and will likely recommend 5 days antibiotics after drain placement, complete on 2/9.  Consider Augmentin or Cipro/Flagyl pending sensitivities. Standard of care is to consider interval appendectomy 6 to 8 weeks after surgery with colonoscopy around that time, will discuss outpatient with surgery. -PT evaluated, no ED follow-up, supervision/assistance 24 hours -F/u OT eval -Continue IV Zosyn, consider transitioning to oral antibiotics pending sensitivities -Monitor drain output -Follow-up abscesscultures -Daily  CBC, trend fever curve  Persistent atrial fibrillation:  History of A.fib with CHADSVASC2 of 5 on chronic warfarin therapy. Rate controlled with diltiazem and metoprolol at home.  - Continue diltiazem 240mg  bid - Continue metoprolol 25mg  qd+50mg  qHS - Continue warfarin per schedule (5 mg on TThSat, 2.5mg  MWFSun) - INR/PT monitoring  - Continuous cardiac monitoring  Hypothyroidism: - Continue armour thyroid 180mg  qd   CHF:  Currently appears euvolemic on exam.  - Continue metoprolol 25mg  qd + 50mg  qHS - Torsemide 20mg  bid  Hypertension - Continue home medications as above  FEN/GI:  Diet: soft diet, advance as tolerated  Fluids: None   VTE Prophylaxis: Warfarin Code: DNR/DNI  Prior to Admission Living Arrangement: Home  Anticipated Discharge Location: Home Barriers to Discharge: ongoing medical work up Dispo: Anticipated discharge pending clinical improvement.   Asencion Noble, MD 06/01/2019, 10:06 AM Pager: [phone]

## 2019-06-01 NOTE — Progress Notes (Signed)
  Date: 06/01/2019  Patient name: Ashley Dunn  Medical record number: [id number]  Date of birth: Oct 15, 1925   This patient's plan of care was discussed with the house staff. Please see Dr. Dorothyann Peng note for complete details. I concur with her findings and plan.     Sid Falcon, MD 06/01/2019, 7:53 PM

## 2019-06-01 NOTE — Evaluation (Signed)
Occupational Therapy Evaluation Patient Details Name: Ashley Dunn MRN: [id number] DOB: [birth year] Today's Date: 06/01/2019    History of Present Illness Pt is a 86 y/o female admitted secondary to abdominal pain. Pt found to have acute appendicitis with abscess, now s/p CT guided drain placement. PMH including but not limited to a-fib, CHF and HTN.   Clinical Impression   PTA patient independent with ADLs, mobility using rollator.  Patient admitted for above and limited by problem list below, including mild unsteadiness, decreased activity tolerance.  She was on RA upon entry with SpO2 >90% at rest, but with activity decreased to 85-88% but poor waveform and asymptomatic.  Patient completing transfers with min guard assist, in room mobility using RW with min guard assist, LB ADLs with min guard assist and UB ADls with setup assist.   She requires min cueing for safety awareness.  She reports she has 24/7 support at dc, and agreeable to have her daughter assist with ADLs as needed (shower specifically for safety).  She will benefit from continued OT services while admitted, but anticipate she will progress well with no further needs after dc home.     Follow Up Recommendations  No OT follow up;Supervision - Intermittent    Equipment Recommendations  3 in 1 bedside commode    Recommendations for Other Services       Precautions / Restrictions Precautions Precautions: Fall Precaution Comments: jp drain R LQ abdomen Restrictions Weight Bearing Restrictions: No      Mobility Bed Mobility Overal bed mobility: Needs Assistance Bed Mobility: Supine to Sit;Sit to Supine     Supine to sit: Supervision Sit to supine: Supervision   General bed mobility comments: for safety and line management  Transfers Overall transfer level: Needs assistance Equipment used: Rolling walker (2 wheeled);None Transfers: Sit to/from Stand Sit to Stand: Min guard         General transfer comment: min  guard for safety/balance, cueing for hand placement     Balance Overall balance assessment: Needs assistance Sitting-balance support: Feet supported Sitting balance-Leahy Scale: Good     Standing balance support: Bilateral upper extremity supported;During functional activity Standing balance-Leahy Scale: Fair Standing balance comment: able to engage in ADls standing without UE support, relaint on RW dynamically                           ADL either performed or assessed with clinical judgement   ADL Overall ADL's : Needs assistance/impaired     Grooming: Min guard;Standing   Upper Body Bathing: Set up;Sitting   Lower Body Bathing: Min guard;Sit to/from stand   Upper Body Dressing : Set up;Sitting   Lower Body Dressing: Min guard;Sit to/from stand   Toilet Transfer: Min guard;Ambulation;Regular Toilet;Grab bars;RW Armed forces technical officer Details (indicate cue type and reason): heavy relaince on grabbar to ascend          Functional mobility during ADLs: Surveyor, minerals     Praxis      Pertinent Vitals/Pain Pain Assessment: No/denies pain     Hand Dominance     Extremity/Trunk Assessment Upper Extremity Assessment Upper Extremity Assessment: Overall WFL for tasks assessed   Lower Extremity Assessment Lower Extremity Assessment: Defer to PT evaluation       Communication Communication Communication: HOH   Cognition Arousal/Alertness: Awake/alert Behavior During Therapy: WFL for tasks assessed/performed Overall Cognitive Status:  Impaired/Different from baseline Area of Impairment: Safety/judgement                         Safety/Judgement: Decreased awareness of safety         General Comments  pt on RA with SpO2 >90 at rest, ranged from 85-90 during activity but poor waveform; returned supine and SpO2 >92%    Exercises     Shoulder Instructions      Home Living Family/patient expects to  be discharged to:: Private residence Living Arrangements: Children Available Help at Discharge: Family;Available 24 hours/day Type of Home: House Home Access: Stairs to enter [employer] of Steps: 1   Home Layout: One level     Bathroom Shower/Tub: Occupational psychologist: Standard     Home Equipment: Environmental consultant - 4 wheels;Cane - single point          Prior Functioning/Environment Level of Independence: Independent with assistive device(s)        Comments: ambulates with use of a rollator, independent with ADLs, limited iADLs         OT Problem List: Decreased activity tolerance;Impaired balance (sitting and/or standing);Decreased safety awareness;Cardiopulmonary status limiting activity;Obesity      OT Treatment/Interventions: Self-care/ADL training;DME and/or AE instruction;Energy conservation;Therapeutic activities;Balance training;Patient/family education    OT Goals(Current goals can be found in the care plan section) Acute Rehab OT Goals Patient Stated Goal: home soon OT Goal Formulation: With patient Time For Goal Achievement: 06/15/19 Potential to Achieve Goals: Good  OT Frequency: Min 2X/week   Barriers to D/C:            Co-evaluation              AM-PAC OT "6 Clicks" Daily Activity     Outcome Measure Help from another person eating meals?: None Help from another person taking care of personal grooming?: A Little Help from another person toileting, which includes using toliet, bedpan, or urinal?: A Little Help from another person bathing (including washing, rinsing, drying)?: A Little Help from another person to put on and taking off regular upper body clothing?: None Help from another person to put on and taking off regular lower body clothing?: A Little 6 Click Score: 20   End of Session Equipment Utilized During Treatment: Rolling walker Nurse Communication: Mobility status  Activity Tolerance: Patient tolerated  treatment well Patient left: in bed;with call bell/phone within reach  OT Visit Diagnosis: Muscle weakness (generalized) (M62.81);Unsteadiness on feet (R26.81)                Time: 1022-1050 OT Time Calculation (min): 28 min Charges:  OT General Charges $OT Visit: 1 Visit OT Evaluation $OT Eval Moderate Complexity: 1 Mod OT Treatments $Self Care/Home Management : 8-22 mins  Ashley Dunn, OT Acute Rehabilitation Services Pager [phone] Office [phone]   Ashley Dunn 06/01/2019, 11:22 AM

## 2019-06-01 NOTE — Progress Notes (Signed)
Ashley Dunn [id number] 06-29-1925  CARE TEAM:  PCP: Forrest Moron, MD  Outpatient Care Team: Patient Care Team: Forrest Moron, MD as PCP - General (Internal Medicine)  Inpatient Treatment Team: Treatment Team: Attending Provider: Aldine Contes, MD; Rounding Team: Edison Pace, Md, MD; Rounding Team: (Rounding), Imts - Vanita Ingles, MD; Rounding Team: D[facility], MD; Technician: [place], [place], [place]; Utilization Review: Orlean Bradford, RN; Registered Nurse: Dulcy Fanny, RN; Occupational Therapist: Delight Stare, OT; Social Worker: Bary Castilla, LCSW   Problem List:   Active Problems:   Appendiceal abscess      * No surgery found *      Assessment/Plan  Perforated appendicitis with abscess7 x 5 x 5 cm -s/p IR drain placement 2/4 - WBC normalized - continue IV Zosyn.  Proteus mirabilis and cultures.  Sensitivity pending.  Most likely would recommend 5 days antibiotics after drain placement = 2/9 should be complete.  Can transition to oral antibiotics if is discharged sooner.  Sensitivities pending.  Most likely Augmentin or Cipro/Flagyl unless there is a resistant organism  - Ileus appears to be resolving.  Heart healthy diet - patient will need follow up in IR drain clinic and CCS,  -Usually standard of care is to consider interval appendectomy 6-8 weeks after surgery with colonoscopy around that time to make sure there is no other potential source for the pelvic abscess such as a colorectal cancer or diverticulitis.  She has had fecal occult test but no colonoscopies.  She does not have a fecalith and she is of advanced age with atrial fibrillation, so her operative risks are not low and hopefully her risk of recurrent appendicitis is lower than average.  She seems to want to avoid having surgery if possible.  Can discuss as outpatient with one of our surgeons    ID -zosyn VTE -SCDs,INR 2 this AM on warfarin FEN -heart healthy diet    Foley -none Follow up -TBD  LOS: 2 days   20 minutes spent in review, evaluation, examination, counseling, and coordination of care.  More than 50% of that time was spent in counseling.  06/01/2019    Subjective: (Chief complaint)  Patient sitting up in bed.  Had loose bowel movement after eating food.  "It just ran through me".  Nurse tech in room helping clean up.  Joking and in good spirits.  Chatty.  Objective:  Vital signs:  Vitals:   05/31/19 2128 05/31/19 2131 05/31/19 2133 06/01/19 0516  BP:    131/79  Pulse:    92  Resp:   15 18  Temp:    (!) 97.3 F (36.3 C)  TempSrc:    Oral  SpO2: (!) 85% 93% 97% 91%  Weight:    73.1 kg    Last BM Date: 05/31/19  Intake/Output   Yesterday:  02/05 0701 - 02/06 0700 In: 375 [P.O.:360; I.V.:10] Out: 714 [Urine:700; Drains:14] This shift:  No intake/output data recorded.  Bowel function:  Flatus: YES  BM:  YES  Drain: Serosanguinous   Physical Exam:  General: Pt awake/alert in no acute distress.  Sitting up. Eyes: PERRL, normal EOM.  Sclera clear.  No icterus Neuro: CN II-XII intact w/o focal sensory/motor deficits. Lymph: No head/neck/groin lymphadenopathy Psych:  No delerium/psychosis/paranoia.  Oriented x 4.  Jovial and chatty.  Joking. HENT: Normocephalic, Mucus membranes moist.  No thrush Neck: Supple, No tracheal deviation.  No obvious thyromegaly Chest: No pain to chest wall compression.  Good respiratory  excursion.  No audible wheezing CV:  Pulses intact.  Regular rhythm.  No major extremity edema MS: Normal AROM mjr joints.  No obvious deformity  Abdomen: Soft.  Mildy distended.  Tenderness at [facility] site primarily.  Drain site clean..  No evidence of peritonitis.  No incarcerated hernias.  Ext:   No deformity.  No mjr edema.  No cyanosis Skin: No petechiae / purpurea.  No major sores.  Warm and dry    Results:   Cultures: Recent Results (from the past 720 hour(s))  Urine culture      Status: Abnormal   Collection Time: 05/29/19  1:26 PM   Specimen: Urine, Clean Catch  Result Value Ref Range Status   Specimen Description URINE, CLEAN CATCH  Final   Special Requests   Final    Normal Performed at [facility], [address], [place], [place] [address]    Culture MULTIPLE SPECIES PRESENT, SUGGEST RECOLLECTION (A)  Final   Report Status 05/30/2019 FINAL  Final  Respiratory Panel by RT PCR (Flu A&B, Covid) - Nasopharyngeal Swab     Status: None   Collection Time: 05/29/19  4:00 PM   Specimen: Nasopharyngeal Swab  Result Value Ref Range Status   SARS Coronavirus 2 by RT PCR NEGATIVE NEGATIVE Final    Comment: (NOTE) SARS-CoV-2 target nucleic acids are NOT DETECTED. The SARS-CoV-2 RNA is generally detectable in upper respiratoy specimens during the acute phase of infection. The lowest concentration of SARS-CoV-2 viral copies this assay can detect is 131 copies/mL. A negative result does not preclude SARS-Cov-2 infection and should not be used as the sole basis for treatment or other patient management decisions. A negative result may occur with  improper specimen collection/handling, submission of specimen other than nasopharyngeal swab, presence of viral mutation(s) within the areas targeted by this assay, and inadequate number of viral copies (<131 copies/mL). A negative result must be combined with clinical observations, patient history, and epidemiological information. The expected result is Negative. Fact Sheet for Patients:  PinkCheek.be Fact Sheet for Healthcare Providers:  GravelBags.it This test is not yet ap proved or cleared by the [place] FDA and  has been authorized for detection and/or diagnosis of SARS-CoV-2 by FDA under an Emergency Use Authorization (EUA). This EUA will remain  in effect (meaning this test can be used) for the duration of the COVID-19 declaration under Section  564(b)(1) of the Act, 21 U.S.C. section 360bbb-3(b)(1), unless the authorization is terminated or revoked sooner.    Influenza A by PCR NEGATIVE NEGATIVE Final   Influenza B by PCR NEGATIVE NEGATIVE Final    Comment: (NOTE) The Xpert Xpress SARS-CoV-2/FLU/RSV assay is intended as an aid in  the diagnosis of influenza from Nasopharyngeal swab specimens and  should not be used as a sole basis for treatment. Nasal washings and  aspirates are unacceptable for Xpert Xpress SARS-CoV-2/FLU/RSV  testing. Fact Sheet for Patients: PinkCheek.be Fact Sheet for Healthcare Providers: GravelBags.it This test is not yet approved or cleared by the [place] FDA and  has been authorized for detection and/or diagnosis of SARS-CoV-2 by  FDA under an Emergency Use Authorization (EUA). This EUA will remain  in effect (meaning this test can be used) for the duration of the  Covid-19 declaration under Section 564(b)(1) of the Act, 21  U.S.C. section 360bbb-3(b)(1), unless the authorization is  terminated or revoked. Performed at [facility], [place] [address] [place], [place] [address]   Aerobic/Anaerobic Culture (surgical/deep wound)  Status: None (Preliminary result)   Collection Time: 05/30/19 11:20 AM   Specimen: Abscess  Result Value Ref Range Status   Specimen Description ABSCESS  Final   Special Requests NONE  Final   Gram Stain   Final    ABUNDANT WBC PRESENT, PREDOMINANTLY PMN MODERATE GRAM NEGATIVE RODS Performed at [facility], [address] [place], [place] [address]    Culture ABUNDANT PROTEUS MIRABILIS  Final   Report Status PENDING  Incomplete    Labs: Results for orders placed or performed during the hospital encounter of 05/29/19 (from the past 48 hour(s))  Aerobic/Anaerobic Culture (surgical/deep wound)     Status: None (Preliminary result)   Collection Time: 05/30/19 11:20 AM   Specimen: Abscess   Result Value Ref Range   Specimen Description ABSCESS    Special Requests NONE    Gram Stain      ABUNDANT WBC PRESENT, PREDOMINANTLY PMN MODERATE GRAM NEGATIVE RODS Performed at [facility] [address], [place], [place] [address]    Culture ABUNDANT PROTEUS MIRABILIS    Report Status PENDING   Basic metabolic panel     Status: Abnormal   Collection Time: 05/31/19  3:15 AM  Result Value Ref Range   Sodium 139 135 - 145 mmol/L   Potassium 3.1 (L) 3.5 - 5.1 mmol/L   Chloride 94 (L) 98 - 111 mmol/L   CO2 32 22 - 32 mmol/L   Glucose, Bld 101 (H) 70 - 99 mg/dL   BUN 16 8 - 23 mg/dL   Creatinine, Ser 0.97 0.44 - 1.00 mg/dL   Calcium 8.3 (L) 8.9 - 10.3 mg/dL   GFR calc non Af Amer 51 (L) >60 mL/min   GFR calc Af Amer 59 (L) >60 mL/min   Anion gap 13 5 - 15    Comment: Performed at [facility] [address], [place], [place] [address]  CBC     Status: Abnormal   Collection Time: 05/31/19  3:15 AM  Result Value Ref Range   WBC 12.1 (H) 4.0 - 10.5 K/uL   RBC 3.93 3.87 - 5.11 MIL/uL   Hemoglobin 12.2 12.0 - 15.0 g/dL   HCT 36.9 36.0 - 46.0 %   MCV 93.9 80.0 - 100.0 fL   MCH 31.0 26.0 - 34.0 pg   MCHC 33.1 30.0 - 36.0 g/dL   RDW 13.5 11.5 - 15.5 %   Platelets 248 150 - 400 K/uL   nRBC 0.0 0.0 - 0.2 %    Comment: Performed at [facility], [place] [address] [place], [place] [address]  Protime-INR     Status: Abnormal   Collection Time: 05/31/19  3:15 AM  Result Value Ref Range   Prothrombin Time 19.8 (H) 11.4 - 15.2 seconds   INR 1.7 (H) 0.8 - 1.2    Comment: (NOTE) INR goal varies based on device and disease states. Performed at [facility], [place] [address] [place], [place] [address]   Basic metabolic panel     Status: Abnormal   Collection Time: 06/01/19  3:27 AM  Result Value Ref Range   Sodium 141 135 - 145 mmol/L   Potassium 3.8 3.5 - 5.1 mmol/L   Chloride 100 98 - 111 mmol/L   CO2 33 (H) 22 - 32 mmol/L   Glucose, Bld 114 (H) 70 - 99  mg/dL   BUN 22 8 - 23 mg/dL   Creatinine, Ser 1.19 (H) 0.44 - 1.00  mg/dL   Calcium 8.4 (L) 8.9 - 10.3 mg/dL   GFR calc non Af Amer 40 (L) >60 mL/min   GFR calc Af Amer 46 (L) >60 mL/min   Anion gap 8 5 - 15    Comment: Performed at [facility] [address] [place], [name] [address]  CBC     Status: None   Collection Time: 06/01/19  3:27 AM  Result Value Ref Range   WBC 9.5 4.0 - 10.5 K/uL   RBC 3.90 3.87 - 5.11 MIL/uL   Hemoglobin 12.2 12.0 - 15.0 g/dL   HCT 37.4 36.0 - 46.0 %   MCV 95.9 80.0 - 100.0 fL   MCH 31.3 26.0 - 34.0 pg   MCHC 32.6 30.0 - 36.0 g/dL   RDW 13.5 11.5 - 15.5 %   Platelets 259 150 - 400 K/uL   nRBC 0.0 0.0 - 0.2 %    Comment: Performed at [facility], [place] [address], [place], [place] [address]  Protime-INR     Status: Abnormal   Collection Time: 06/01/19  3:27 AM  Result Value Ref Range   Prothrombin Time 22.3 (H) 11.4 - 15.2 seconds   INR 2.0 (H) 0.8 - 1.2    Comment: (NOTE) INR goal varies based on device and disease states. Performed at [facility], [place] [address] [place], [place] [address]     Imaging / Studies: CT IMAGE GUIDED DRAINAGE BY PERCUTANEOUS CATHETER  Result Date: 05/30/2019 INDICATION: 86 year old with appendicitis and periappendiceal abscess. EXAM: CT GUIDED DRAINAGE OF PERIAPPENDICEAL ABSCESS MEDICATIONS: Moderate sedation ANESTHESIA/SEDATION: 1.5 mg IV Versed 25 mcg IV Fentanyl Moderate Sedation Time:  17 minutes The patient was continuously monitored during the procedure by the interventional radiology nurse under my direct supervision. COMPLICATIONS: None immediate. TECHNIQUE: Informed written consent was obtained from the patient after a thorough discussion of the procedural risks, benefits and alternatives. All questions were addressed. Maximal Sterile Barrier Technique was utilized including caps, mask, sterile gowns, sterile gloves, sterile drape, hand hygiene and skin antiseptic. A timeout was performed  prior to the initiation of the procedure. PROCEDURE: Patient was placed supine on the CT scanner. Images through the lower abdomen were obtained. The abscess in the right lower abdomen was identified. The overlying skin was prepped and draped in sterile fashion. Skin was anesthetized with 1% lidocaine. Using CT guidance, 18 gauge trocar needle was directed into the abscess and yellow purulent fluid was aspirated. Stiff Amplatz wire was advanced into the collection. Tract was dilated to accommodate a 10.2 [place] multipurpose drain. 15 mL of yellow purulent fluid was removed. Catheter was sutured to skin and bandage was placed over the catheter. FINDINGS: Abscess in the right lower quadrant adjacent to the appendix. Drain was placed within the periappendiceal abscess. 15 mL of yellow purulent fluid was removed. IMPRESSION: CT-guided placement of a drain within the periappendiceal abscess. Electronically Signed   By: Markus Daft M.D.   On: 05/30/2019 13:46    Medications / Allergies: per chart  Antibiotics: Anti-infectives (From admission, onward)   Start     Dose/Rate Route Frequency Ordered Stop   05/30/19 0000  piperacillin-tazobactam (ZOSYN) IVPB 3.375 g     3.375 g 12.5 mL/hr over 240 Minutes Intravenous Every 8 hours 05/29/19 1620     05/29/19 1630  piperacillin-tazobactam (ZOSYN) IVPB 3.375 g  Status:  Discontinued     3.375 g 100 mL/hr over 30 Minutes Intravenous Every 8 hours 05/29/19 1617 05/29/19  1618   05/29/19 1545  piperacillin-tazobactam (ZOSYN) IVPB 3.375 g     3.375 g 100 mL/hr over 30 Minutes Intravenous  Once 05/29/19 1538 05/29/19 1624        Note: Portions of this report may have been transcribed using voice recognition software. Every effort was made to ensure accuracy; however, inadvertent computerized transcription errors may be present.   Any transcriptional errors that result from this process are unintentional.     Adin Hector, MD, FACS, MASCRS Gastrointestinal  and Minimally Invasive Surgery    [address] [place], [place] [address] [phone] Main / Paging [phone] Fax Please see Amion for pager number, especial 5pm - 7am.

## 2019-06-02 DIAGNOSIS — I10 Essential (primary) hypertension: Secondary | ICD-10-CM | POA: Diagnosis present

## 2019-06-02 DIAGNOSIS — M1990 Unspecified osteoarthritis, unspecified site: Secondary | ICD-10-CM | POA: Diagnosis present

## 2019-06-02 DIAGNOSIS — M810 Age-related osteoporosis without current pathological fracture: Secondary | ICD-10-CM | POA: Diagnosis present

## 2019-06-02 DIAGNOSIS — I509 Heart failure, unspecified: Secondary | ICD-10-CM

## 2019-06-02 DIAGNOSIS — E039 Hypothyroidism, unspecified: Secondary | ICD-10-CM | POA: Diagnosis present

## 2019-06-02 DIAGNOSIS — E079 Disorder of thyroid, unspecified: Secondary | ICD-10-CM | POA: Diagnosis present

## 2019-06-02 LAB — CBC
HCT: 42 % (ref 36.0–46.0)
Hemoglobin: 13.6 g/dL (ref 12.0–15.0)
MCH: 30.9 pg (ref 26.0–34.0)
MCHC: 32.4 g/dL (ref 30.0–36.0)
MCV: 95.5 fL (ref 80.0–100.0)
Platelets: 313 10*3/uL (ref 150–400)
RBC: 4.4 MIL/uL (ref 3.87–5.11)
RDW: 13.4 % (ref 11.5–15.5)
WBC: 9.6 10*3/uL (ref 4.0–10.5)
nRBC: 0 % (ref 0.0–0.2)

## 2019-06-02 LAB — BASIC METABOLIC PANEL
Anion gap: 11 (ref 5–15)
BUN: 19 mg/dL (ref 8–23)
CO2: 34 mmol/L — ABNORMAL HIGH (ref 22–32)
Calcium: 9.1 mg/dL (ref 8.9–10.3)
Chloride: 96 mmol/L — ABNORMAL LOW (ref 98–111)
Creatinine, Ser: 1.11 mg/dL — ABNORMAL HIGH (ref 0.44–1.00)
GFR calc Af Amer: 50 mL/min — ABNORMAL LOW (ref >60)
GFR calc non Af Amer: 43 mL/min — ABNORMAL LOW (ref >60)
Glucose, Bld: 96 mg/dL (ref 70–99)
Potassium: 3.3 mmol/L — ABNORMAL LOW (ref 3.5–5.1)
Sodium: 141 mmol/L (ref 135–145)

## 2019-06-02 LAB — PROTIME-INR
INR: 1.8 — ABNORMAL HIGH (ref 0.8–1.2)
Prothrombin Time: 20.5 seconds — ABNORMAL HIGH (ref 11.4–15.2)

## 2019-06-02 MED ORDER — WARFARIN SODIUM 4 MG PO TABS: 4.0000 mg | ORAL_TABLET | Freq: Once | ORAL | Status: DC

## 2019-06-02 MED ORDER — AMOXICILLIN-POT CLAVULANATE 875-125 MG PO TABS: 1.0000 | ORAL_TABLET | Freq: Two times a day (BID) | ORAL | 0 refills | Status: AC

## 2019-06-02 MED ORDER — POTASSIUM CHLORIDE CRYS ER 20 MEQ PO TBCR
30.0000 meq | EXTENDED_RELEASE_TABLET | Freq: Two times a day (BID) | ORAL | Status: AC
  Administered 2019-06-02 (×2): 30 meq via ORAL
  Filled 2019-06-02 (×2): qty 1

## 2019-06-02 NOTE — Progress Notes (Signed)
IR.  History of appendicitis with with periappendiceal abscess s/p RLQ drain placement in IR 05/30/2019 by Dr. Anselm Pancoast.  If patient is to be discharged, below are discharge instructions: - Flush each drain once daily with 5-10 cc NS flush (patient will need order for flushes upon discharge). - Record output from each drain once daily. - Follow-up at drain clinic 10-14 days after discharge for CT/possible drain injection (assess for possible drain removal)- order placed to facilitate this.  Please call IR with questions/concerns.   Bea Graff Adyn Serna, PA-C 06/02/2019, 9:32 AM

## 2019-06-02 NOTE — Progress Notes (Signed)
I reviewed d/c instructions with patient and daughter. Ashley Dunn, I taught how to clean drain/site and care for it. She return demonstrated how to flush cook drain and how to charge bulb and safely secure. I gave her a few materials that she will need to get through the beginning of the week. No further questions from either patient or daughter. D/c'd via wheelchair to private vehicle in stable condition

## 2019-06-02 NOTE — Progress Notes (Signed)
Subjective: HD#4 Overnight, no acute events reported.   This morning, patient evaluated at bedside. She reports that she is feeling good today. He reports that the area is sensitive, no pain right now. We discussed that she may be able to go home today. Patient is agreeable with the plan. All questions and concerns addressed.   Objective:  Vital signs in last 24 hours: Vitals:   06/01/19 0515 06/01/19 0516 06/01/19 2052 06/02/19 0539  BP: 131/79 131/79 134/73 120/64  Pulse: (!) 41 92 (!) 124 92  Resp: 17 18 18 18   Temp:  (!) 97.3 F (36.3 C) (!) 97.4 F (36.3 C) (!) 97.4 F (36.3 C)  TempSrc:  Oral Oral Oral  SpO2: (!) 88% 91% 96% 96%  Weight:  73.1 kg  71.1 kg   CBC Latest Ref Rng & Units 06/02/2019 06/01/2019 05/31/2019  WBC 4.0 - 10.5 K/uL 9.6 9.5 12.1(H)  Hemoglobin 12.0 - 15.0 g/dL 13.6 12.2 12.2  Hematocrit 36.0 - 46.0 % 42.0 37.4 36.9  Platelets 150 - 400 K/uL 313 259 248   BMP Latest Ref Rng & Units 06/02/2019 06/01/2019 05/31/2019  Glucose 70 - 99 mg/dL 96 114(H) 101(H)  BUN 8 - 23 mg/dL 19 22 16   Creatinine 0.44 - 1.00 mg/dL 1.11(H) 1.19(H) 0.97  BUN/Creat Ratio 12 - 28 - - -  Sodium 135 - 145 mmol/L 141 141 139  Potassium 3.5 - 5.1 mmol/L 3.3(L) 3.8 3.1(L)  Chloride 98 - 111 mmol/L 96(L) 100 94(L)  CO2 22 - 32 mmol/L 34(H) 33(H) 32  Calcium 8.9 - 10.3 mg/dL 9.1 8.4(L) 8.3(L)   Physical Exam  Constitutional: She is oriented to person, place, and time and well-developed, well-nourished, and in no distress.  Cardiovascular: Normal rate, S1 normal, S2 normal and intact distal pulses. An irregularly irregular rhythm present.  No murmur heard. Pulmonary/Chest: Breath sounds normal. No respiratory distress. She has no wheezes. She has no rales.  Abdominal: Soft. Bowel sounds are normal. She exhibits no distension. There is no abdominal tenderness.  Drain in RLQ w/dressing appears clear and dry without surrounding erythema draining ~5cc bloody fluid in drain  Musculoskeletal:         General: No tenderness. Normal range of motion.  Neurological: She is alert and oriented to person, place, and time.  Skin: Skin is warm and dry.   Assessment/Plan: Ms. Ashley Dunn is a 86year old community dwelling female with PMHx of atrial fibrillation on warfarin, hypertension, hypothyroidism, and congestive heart failure presenting with one month history of intermittent RLQ abdominal pain and imaging concerning for complex abscess secondary to appendiceal rupture into abdominal wall.   Perforated appendicitis with large abscess Patient with appendiceal rupture w/complex abscess in lateral abdominal wall s/p CT guided drain placement on 2/4. Cultures growing pan-sensitive proteus mirabilis. She was on IV zosyn during admission with normalization of WBC; will transition to oral Augmentin for completion on 06/04/2019. She has been tolerating PO intake. On exam, no tenderness to RLQ, drain in place with ~5cc bloddy fluid in drain.  Patient is medically stable for discharge today.  - Augmentin 875-125mg  PO (end 2/9) - Continue drain per IR, patient will need to f/u with IR in clinic for removal in few weeks - F/u with surgery in few weeks - F/u with GI for colonoscopy in 6-8 weeks    Persistent atrial fibrillation:  History of A.fib with CHADSVASC2 of 5 on chronic warfarin therapy. Rate controlled with diltiazem and metoprolol at home. She was  noted to have 2 episodes of 2.2 and 2.9 second pauses on telemetry but remains asymptomatic.   - Continue diltiazem 240mg  bid - Continue metoprolol 25mg  qd+50mg  qHS - Continue warfarin per schedule (5 mg on TThSat, 2.5mg  MWFSun) - F/u with cardiologist for cardiac monitoring   Hypothyroidism: - Continue armour thyroid 180mg  qd   CHF:  - Continue metoprolol 25mg  qd + 50mg  qHS - Torsemide 20mg  bid  Hypertension - Resume home medications as above  FEN/GI:  Diet: Heart healthy Fluids: None   VTE Prophylaxis: Warfarin Code:  DNR/DNI  Prior to Admission Living Arrangement: Home  Anticipated Discharge Location: Home  Barriers to Discharge: none  Harvie Heck, MD Internal Medicine, PGY-1 Pager # [phone] 06/02/19   7:12 AM

## 2019-06-02 NOTE — TOC Transition Note (Signed)
Transition of Care [facility] - CM/SW Discharge Note   Patient Details  Name: Ashley Dunn MRN: [id number] Date of Birth: Nov 11, 1925  Transition of Care [facility]) CM/SW Contact:  Claudie Leach, RN 06/02/2019, 12:34 PM   Clinical Narrative:    Spoke with daughter regarding d/c plan.  Daughter, [name], states she will learn to manage drain and does not feel that a [facility] is necessary.  [name] would like for patient to have a 3n1. 3n1 ordered for delivery to room prior to d/c. RN aware.   Final next level of care: Home/Self Care      Discharge Plan and Services                DME Arranged: 3-N-1 DME Agency: [employer] Date DME Agency Contacted: 06/02/19 Time DME Agency Contacted: 1214 Representative spoke with at DME Agency: Bertrum Sol

## 2019-06-02 NOTE — Progress Notes (Addendum)
ANTICOAGULATION CONSULT NOTE Pharmacy Consult for warfarin Indication: atrial fibrillation  No Known Allergies  Patient Measurements: Weight: 156 lb 12.8 oz (71.1 kg)   Vital Signs: Temp: 97.4 F (36.3 C) (02/07 0539) Temp Source: Oral (02/07 0539) BP: 121/60 (02/07 0731) Pulse Rate: 87 (02/07 0854)  Labs: Recent Labs    05/31/19 0315 05/31/19 0315 06/01/19 0327 06/02/19 0358  HGB 12.2   < > 12.2 13.6  HCT 36.9  --  37.4 42.0  PLT 248  --  259 313  LABPROT 19.8*  --  22.3* 20.5*  INR 1.7*  --  2.0* 1.8*  CREATININE 0.97  --  1.19* 1.11*   < > = values in this interval not displayed.    Estimated Creatinine Clearance: 29 mL/min (A) (by C-G formula based on SCr of 1.11 mg/dL (H)).   Medical History: Past Medical History:  Diagnosis Date  . Arthritis   . Atrial fibrillation ([place])   . Cataract   . CHF (congestive heart failure) ([place])   . Hypertension   . Osteoporosis   . Thyroid disease    Assessment: 86 year old female s/p drain for appendiceal abscess. Patient with history of afib on warfarin, this was held on admit for surgery. No bleeding complications noted post IR procedure.   Prior to admit warfarin regimen: 2.5mg  daily except 5mg  on TuThSa  Post drain placement 2/4, no immediate plans for further surgery noted. Warfarin resumed 2/4, INR slightly subtherapeutic at 1.8 with higher than PTA dose on 2/5. CBC stable. No documented s/sx of bleeding. Oral intake documented at 75%.   Goal of Therapy:  INR 2-3 Monitor platelets by anticoagulation protocol: Yes   Plan:  Warfarin 4 mg tonight - above PTA regimen to get back into goal range  Daily INR for now If plan for discharge would consider restarting PTA regimen  Antonietta Jewel, PharmD, BCCCP Clinical Pharmacist  Phone: [phone]  Please check AMION for all [place] phone numbers After 10:00 PM, call [place] [phone] 06/02/2019 10:17 AM

## 2019-06-02 NOTE — Progress Notes (Signed)
Patient has had 2 pauses, 2.2 and 2.9 second pauses. She is asyptomatic, bp 123/68. I notified Dr. Tharon Aquas. They will assess on rounds.

## 2019-06-02 NOTE — Progress Notes (Signed)
  Date: 06/02/2019  Patient name: Ashley Dunn  Medical record number: [id number]  Date of birth: [birth year]   This patient's plan of care was discussed with the house staff. Please see Dr. Margy Clarks note for complete details. I concur with her findings.  Discharge today with appropriate follow up planned.    Sid Falcon, MD 06/02/2019, 8:18 PM

## 2019-06-02 NOTE — Progress Notes (Addendum)
Ashley Dunn [id number] 30-Mar-1926  CARE TEAM:  PCP: Forrest Moron, MD  Outpatient Care Team: Patient Care Team: Forrest Moron, MD as PCP - General (Internal Medicine)  Inpatient Treatment Team: Treatment Team: Attending Provider: Aldine Contes, MD; Rounding Team: Edison Pace, Md, MD; Rounding Team: (Rounding), Imts - Vanita Ingles, MD; Rounding Team: D[facility], MD; Technician: [place], [place], [place]; Utilization Review: Orlean Bradford, RN; Social Worker: Latanya Maudlin; Respiratory Therapist: Hansel Feinstein, RRT   Problem List:   Active Problems:   Persistent atrial fibrillation [facility])   Appendiceal abscess   Hypertension   Osteoporosis   Thyroid disease   CHF (congestive heart failure) ([place])   Arthritis      * No surgery found *      Assessment/Plan  Perforated appendicitis with abscess7 x 5 x 5 cm -s/p IR drain placement 2/4 - WBC normalized - continue IV Zosyn while admitted.  Proteus mirabilis - pansesnitive.  Would recommend 5 days antibiotics after drain placement = 2/9 should be complete.  Can transition to oral antibiotics at discharge if she is discharged sooner.  Sensitivities pending.  Most likely Augmentin 875 PO BID  - Ileus resolved.  Heart healthy diet - patient will need follow up in IR drain clinic and CCS   -Usually standard of care is to consider colonoscopy in 6-8 weeks to rule out colorectal source.  Then interval appendectomy.  She is never had a colonoscopy.  She does not have a fecalith and she is of advanced age with atrial fibrillation, so her operative risks are not low.  Her risk of recurrent appendicitis is lower than average.  Most likely reasonable to observe and hold off on surgery unless she has recurrent appendicitis.  She seems to want to avoid having surgery if possible.  Can discuss as outpatient with one of our surgeons.  She wishes to follow-up with me   ID -zosyn VTE -SCDs,INR 2 this AM on  warfarin FEN -heart healthy diet  [place] -none Follow up - Patient will need follow-up drain clinic study by interventional radiology in approximately 2 weeks to make sure her abscess cavity is gone and she does not have a chronic fistula.  Patient can follow-up with [place] surgery to make sure she is recovering well.  I gave her my card if she wishes me to be her Psychologist, sport and exercise.  Patient needs colonoscopy scheduled in 6-8 weeks to make sure that the abscess is not from diverticulitis or perforated cancer.  Through [facility].  Therefore would recommend follow-up with [facility]    LOS: 2 days   I updated the patient's status to the Patient in the room.  Then I discussed with her daughter, [name], on the phone as well.  Recommendations were made.  Questions were answered.  They expressed understanding & appreciation.   06/02/2019    Subjective: (Chief complaint)  Pain less.  Tolerating solid diet.  Bowel movements loose but more controlled.  Hoping to go home soon.  Patient wanted me to talk to her daughter, [name], on the phone as well  Objective:  Vital signs:  Vitals:   06/01/19 0516 06/01/19 2052 06/02/19 0539 06/02/19 0731  BP: 131/79 134/73 120/64 121/60  Pulse: 92 (!) 124 92 (!) 54  Resp: 18 18 18 20   Temp: (!) 97.3 F (36.3 C) (!) 97.4 F (36.3 C) (!) 97.4 F (36.3 C)   TempSrc: Oral Oral Oral   SpO2: 91% 96% 96% 97%  Weight: 73.1 kg  71.1 kg     Last BM Date: 06/01/19  Intake/Output   Yesterday:  02/06 0701 - 02/07 0700 In: 700 [P.O.:822] Out: 1749 [Urine:3225; Drains:20; Stool:2] This shift:  Total I/O In: 240 [P.O.:240] Out: -   Bowel function:  Flatus: YES  BM:  YES  Drain: Mostly serous with some occasional clots.  Not feculent nor purulent   Physical Exam:  General: Pt awake/alert in no acute distress.  Sitting up. Eyes: PERRL, normal EOM.  Sclera clear.  No icterus Neuro: CN II-XII intact w/o focal  sensory/motor deficits. Lymph: No head/neck/groin lymphadenopathy Psych:  No delerium/psychosis/paranoia.  Oriented x 4.  Jovial and chatty.  Joking. HENT: Normocephalic, Mucus membranes moist.  No thrush Neck: Supple, No tracheal deviation.  No obvious thyromegaly Chest: No pain to chest wall compression.  Good respiratory excursion.  No audible wheezing CV:  Pulses intact.  Regular rhythm.  No major extremity edema MS: Normal AROM mjr joints.  No obvious deformity  Abdomen: Soft.  Nondistended.  Tenderness at [facility] site primarily.  Drain site clean..  No evidence of peritonitis.  No incarcerated hernias.  Ext:   No deformity.  No mjr edema.  No cyanosis Skin: No petechiae / purpurea.  No major sores.  Warm and dry    Results:   Cultures: Recent Results (from the past 720 hour(s))  Urine culture     Status: Abnormal   Collection Time: 05/29/19  1:26 PM   Specimen: Urine, Clean Catch  Result Value Ref Range Status   Specimen Description URINE, CLEAN CATCH  Final   Special Requests   Final    Normal Performed at [facility], [address], [place], [place] [address]    Culture MULTIPLE SPECIES PRESENT, SUGGEST RECOLLECTION (A)  Final   Report Status 05/30/2019 FINAL  Final  Respiratory Panel by RT PCR (Flu A&B, Covid) - Nasopharyngeal Swab     Status: None   Collection Time: 05/29/19  4:00 PM   Specimen: Nasopharyngeal Swab  Result Value Ref Range Status   SARS Coronavirus 2 by RT PCR NEGATIVE NEGATIVE Final    Comment: (NOTE) SARS-CoV-2 target nucleic acids are NOT DETECTED. The SARS-CoV-2 RNA is generally detectable in upper respiratoy specimens during the acute phase of infection. The lowest concentration of SARS-CoV-2 viral copies this assay can detect is 131 copies/mL. A negative result does not preclude SARS-Cov-2 infection and should not be used as the sole basis for treatment or other patient management decisions. A negative result may occur with  improper  specimen collection/handling, submission of specimen other than nasopharyngeal swab, presence of viral mutation(s) within the areas targeted by this assay, and inadequate number of viral copies (<131 copies/mL). A negative result must be combined with clinical observations, patient history, and epidemiological information. The expected result is Negative. Fact Sheet for Patients:  PinkCheek.be Fact Sheet for Healthcare Providers:  GravelBags.it This test is not yet ap proved or cleared by the [place] FDA and  has been authorized for detection and/or diagnosis of SARS-CoV-2 by FDA under an Emergency Use Authorization (EUA). This EUA will remain  in effect (meaning this test can be used) for the duration of the COVID-19 declaration under Section 564(b)(1) of the Act, 21 U.S.C. section 360bbb-3(b)(1), unless the authorization is terminated or revoked sooner.    Influenza A by PCR NEGATIVE NEGATIVE Final   Influenza B by PCR NEGATIVE NEGATIVE Final    Comment: (NOTE) The Xpert Xpress SARS-CoV-2/FLU/RSV  assay is intended as an aid in  the diagnosis of influenza from Nasopharyngeal swab specimens and  should not be used as a sole basis for treatment. Nasal washings and  aspirates are unacceptable for Xpert Xpress SARS-CoV-2/FLU/RSV  testing. Fact Sheet for Patients: PinkCheek.be Fact Sheet for Healthcare Providers: GravelBags.it This test is not yet approved or cleared by the [place] FDA and  has been authorized for detection and/or diagnosis of SARS-CoV-2 by  FDA under an Emergency Use Authorization (EUA). This EUA will remain  in effect (meaning this test can be used) for the duration of the  Covid-19 declaration under Section 564(b)(1) of the Act, 21  U.S.C. section 360bbb-3(b)(1), unless the authorization is  terminated or revoked. Performed at [facility], [place] [address], [place], [place] [address]   Aerobic/Anaerobic Culture (surgical/deep wound)     Status: None (Preliminary result)   Collection Time: 05/30/19 11:20 AM   Specimen: Abscess  Result Value Ref Range Status   Specimen Description ABSCESS  Final   Special Requests NONE  Final   Gram Stain   Final    ABUNDANT WBC PRESENT, PREDOMINANTLY PMN MODERATE GRAM NEGATIVE RODS    Culture   Final    ABUNDANT PROTEUS MIRABILIS HOLDING FOR POSSIBLE ANAEROBE Performed at [facility], [address], [place], [place] [address]    Report Status PENDING  Incomplete   Organism ID, Bacteria PROTEUS MIRABILIS  Final      Susceptibility   Proteus mirabilis - MIC*    AMPICILLIN <=2 SENSITIVE Sensitive     CEFAZOLIN <=4 SENSITIVE Sensitive     CEFEPIME <=0.12 SENSITIVE Sensitive     CEFTAZIDIME <=1 SENSITIVE Sensitive     CEFTRIAXONE <=0.25 SENSITIVE Sensitive     CIPROFLOXACIN <=0.25 SENSITIVE Sensitive     GENTAMICIN <=1 SENSITIVE Sensitive     IMIPENEM 2 SENSITIVE Sensitive     TRIMETH/SULFA <=20 SENSITIVE Sensitive     AMPICILLIN/SULBACTAM <=2 SENSITIVE Sensitive     PIP/TAZO <=4 SENSITIVE Sensitive     * ABUNDANT PROTEUS MIRABILIS    Labs: Results for orders placed or performed during the hospital encounter of 05/29/19 (from the past 48 hour(s))  Basic metabolic panel     Status: Abnormal   Collection Time: 06/01/19  3:27 AM  Result Value Ref Range   Sodium 141 135 - 145 mmol/L   Potassium 3.8 3.5 - 5.1 mmol/L   Chloride 100 98 - 111 mmol/L   CO2 33 (H) 22 - 32 mmol/L   Glucose, Bld 114 (H) 70 - 99 mg/dL   BUN 22 8 - 23 mg/dL   Creatinine, Ser 1.19 (H) 0.44 - 1.00 mg/dL   Calcium 8.4 (L) 8.9 - 10.3 mg/dL   GFR calc non Af Amer 40 (L) >60 mL/min   GFR calc Af Amer 46 (L) >60 mL/min   Anion gap 8 5 - 15    Comment: Performed at [facility], [address], [place] [address]  CBC     Status: None   Collection Time: 06/01/19  3:27 AM  Result  Value Ref Range   WBC 9.5 4.0 - 10.5 K/uL   RBC 3.90 3.87 - 5.11 MIL/uL   Hemoglobin 12.2 12.0 - 15.0 g/dL   HCT 37.4 36.0 - 46.0 %   MCV 95.9 80.0 - 100.0 fL   MCH 31.3 26.0 - 34.0 pg   MCHC 32.6 30.0 - 36.0 g/dL   RDW 13.5 11.5 -  15.5 %   Platelets 259 150 - 400 K/uL   nRBC 0.0 0.0 - 0.2 %    Comment: Performed at [facility], [place] [address], [place], [place] [address]  Protime-INR     Status: Abnormal   Collection Time: 06/01/19  3:27 AM  Result Value Ref Range   Prothrombin Time 22.3 (H) 11.4 - 15.2 seconds   INR 2.0 (H) 0.8 - 1.2    Comment: (NOTE) INR goal varies based on device and disease states. Performed at [facility], [place] [address], [place], [place] [address]   Basic metabolic panel     Status: Abnormal   Collection Time: 06/02/19  3:58 AM  Result Value Ref Range   Sodium 141 135 - 145 mmol/L   Potassium 3.3 (L) 3.5 - 5.1 mmol/L   Chloride 96 (L) 98 - 111 mmol/L   CO2 34 (H) 22 - 32 mmol/L   Glucose, Bld 96 70 - 99 mg/dL   BUN 19 8 - 23 mg/dL   Creatinine, Ser 1.11 (H) 0.44 - 1.00 mg/dL   Calcium 9.1 8.9 - 10.3 mg/dL   GFR calc non Af Amer 43 (L) >60 mL/min   GFR calc Af Amer 50 (L) >60 mL/min   Anion gap 11 5 - 15    Comment: Performed at [place] [address], [place] [address]  CBC     Status: None   Collection Time: 06/02/19  3:58 AM  Result Value Ref Range   WBC 9.6 4.0 - 10.5 K/uL   RBC 4.40 3.87 - 5.11 MIL/uL   Hemoglobin 13.6 12.0 - 15.0 g/dL   HCT 42.0 36.0 - 46.0 %   MCV 95.5 80.0 - 100.0 fL   MCH 30.9 26.0 - 34.0 pg   MCHC 32.4 30.0 - 36.0 g/dL   RDW 13.4 11.5 - 15.5 %   Platelets 313 150 - 400 K/uL   nRBC 0.0 0.0 - 0.2 %    Comment: Performed at [facility], [place] [address], [place], [place] [address]  Protime-INR     Status: Abnormal   Collection Time: 06/02/19  3:58 AM  Result Value Ref Range   Prothrombin Time 20.5 (H) 11.4 - 15.2 seconds   INR 1.8 (H) 0.8 - 1.2    Comment: (NOTE) INR goal  varies based on device and disease states. Performed at [facility], [place] [address] [place], [place] [address]     Imaging / Studies: No results found.  Medications / Allergies: per chart  Antibiotics: Anti-infectives (From admission, onward)   Start     Dose/Rate Route Frequency Ordered Stop   06/01/19 2200  amoxicillin-clavulanate (AUGMENTIN) 500-125 MG per tablet 500 mg     500 mg Oral 2 times daily 06/01/19 1137 06/05/19 0959   05/30/19 0000  piperacillin-tazobactam (ZOSYN) IVPB 3.375 g  Status:  Discontinued     3.375 g 12.5 mL/hr over 240 Minutes Intravenous Every 8 hours 05/29/19 1620 06/01/19 1137   05/29/19 1630  piperacillin-tazobactam (ZOSYN) IVPB 3.375 g  Status:  Discontinued     3.375 g 100 mL/hr over 30 Minutes Intravenous Every 8 hours 05/29/19 1617 05/29/19 1618   05/29/19 1545  piperacillin-tazobactam (ZOSYN) IVPB 3.375 g     3.375 g 100 mL/hr over 30 Minutes Intravenous  Once 05/29/19 1538 05/29/19 1624        Note: Portions of this report may have been transcribed using voice recognition  software. Every effort was made to ensure accuracy; however, inadvertent computerized transcription errors may be present.   Any transcriptional errors that result from this process are unintentional.     Adin Hector, MD, FACS, MASCRS Gastrointestinal and Minimally Invasive Surgery    [address], [place],  [address] [phone] Main / Paging [phone] Fax Please see Amion for pager number, especial 5pm - 7am.

## 2019-06-03 ENCOUNTER — Other Ambulatory Visit: Payer: Self-pay | Admitting: Surgery

## 2019-06-03 DIAGNOSIS — K3533 Acute appendicitis with perforation and localized peritonitis, with abscess: Secondary | ICD-10-CM

## 2019-06-03 LAB — AEROBIC/ANAEROBIC CULTURE W GRAM STAIN (SURGICAL/DEEP WOUND)

## 2019-06-03 NOTE — Discharge Summary (Signed)
Name: Ashley Dunn MRN: [id number] DOB: Feb 02, 1926 86 y.o. PCP: Forrest Moron, MD  Date of Admission: 05/29/2019 12:21 PM Date of Discharge: 06/02/2019 Attending Physician: No att. providers found  Discharge Diagnosis: 1. Perforated appendicitis with large abscess s/p drain placement 2. Persistent atrial fibrillation  3. Hypothyroidism  4. Incidental CT findings  Discharge Medications: Allergies as of 06/02/2019   No Known Allergies     Medication List    TAKE these medications   amoxicillin-clavulanate 875-125 MG tablet Commonly known as: Augmentin Take 1 tablet by mouth 2 (two) times daily for 2 days.   diltiazem 240 MG 24 hr capsule Commonly known as: CARDIZEM CD Take 240 mg by mouth 2 (two) times daily.   metoprolol succinate 50 MG 24 hr tablet Commonly known as: TOPROL-XL Take 50 mg by mouth daily. Take with 25mg  for a total of 75mg  What changed: Another medication with the same name was changed. Make sure you understand how and when to take each.   metoprolol succinate 25 MG 24 hr tablet Commonly known as: TOPROL-XL Take 1 tablet (25 mg total) by mouth daily. What changed: additional instructions   thyroid 180 MG tablet Commonly known as: ARMOUR Take 180 mg by mouth daily.   thyroid 120 MG tablet Commonly known as: Armour Thyroid Take 1 tablet (120 mg total) by mouth daily before breakfast.   thyroid 60 MG tablet Commonly known as: Armour Thyroid Take 60mg  by mouth daily and add to the 120mg    torsemide 20 MG tablet Commonly known as: DEMADEX Take 20 mg by mouth 2 (two) times daily.   warfarin 5 MG tablet Commonly known as: COUMADIN Take as directed. If you are unsure how to take this medication, talk to your nurse or doctor. Original instructions: Take 2.5-5 mg by mouth See admin instructions. Take 2.5mg  Mon, Wed, Fri and Sun after supper. Take 5 MG Tuesday, Thursday and Saturday after supper. Or as directed by cardiologist Take 5 mg by mouth        Disposition and follow-up:   Ms.Gala Canoy was discharged from [facility] in Stable condition.  At the hospital follow up visit please address:  1.  Perforated appendicitis with large abscess s/p drain placement:  Patient w/appendiceal rupture w/complex abscess s/p CT guided drain placement. Cultures with pan sensitive proteus mirabilis. Patient discharged on Augmentin for completion on 2/9. Drain in place with minimal bloody drainage noted.  - Please ensure patient has completed antibiotic course - Patient is to follow up with IR in clinic within few weeks; IR to arrange this - Patient to follow up with surgery in few weeks in office - Patient would benefit from colonoscopy in 6-8 weeks   Persistent atrial fibrillation:  Patient on chronic warfarin therapy for CHADSVASC2 of 5. Rate controlled on diltiazem and metoprolol. Noted to have 2 episodes of asymptomatic 2-3 second pauses on telemetry.  - Continue diltiazem and metoprolol and warfarin per schedule - Follow up with cardiologist   Bilateral renal cysts:  CT w/ilateral renal cysts including a 3.7 cm cyst in the upper pole right kidney with potential septation vs nodule - Recommend f/u MRI in 3 months   Pulmonary nodularity: CT w/ 2.6cm nodular opacity in posterior RLL - scarring vs atelectasis but cannot exclude neoplasm - Recommend close follow up w/ PCP   2.  Labs / imaging needed at time of follow-up: CBC  3.  Pending labs/ test needing follow-up: None   Follow-up Appointments: Follow-up  Information    [name], MD. Schedule an appointment as soon as possible for a visit in 3 week(s).   Specialty: General Surgery Why: Follow-up after drain clinic visit Contact information: [address] [place] [address] [phone]        Nigel Mormon, MD. Schedule an appointment as soon as possible for a visit in 2 week(s).   Specialties: Cardiology, Radiology Contact  information: [address] [place] [address] [phone]        Forrest Moron, MD. Schedule an appointment as soon as possible for a visit in 2 week(s).   Specialty: Internal Medicine Contact information: [name] [address] [phone]        [facility]. Schedule an appointment as soon as possible for a visit in 3 day(s).   Specialty: Radiology Why: You will be contacted by Interventional Radiology to schedule an appointment for drain removal  Contact information: [address] [id number] [place] [phone]       [facility]. Schedule an appointment as soon as possible for a visit in 6 week(s).   Specialty: Gastroenterology Why: Please schedule an appointment in 6-8 weeks for colonoscopy  Contact information: [place] [phone] [facility] Course by problem list: 1.Perforated appendicitis with large abscess s/p drain placement Patient with appendiceal rupture w/complex abscess in lateral abdominal wall s/p CT guided drain placement on 2/4. Patient treated with IV zosyn but narrowed to Augmentin for pan-sensitive proteus mirabilis. She is tolerating PO intake and drain in place with minimal bloody fluid noted. Patient transitioned to Augmentin 875-125mg  to complete on 06/04/2019. She is to follow up with IR in clinic for removal of drain and follow up with surgery to monitor progress. She would also benefit from colonoscopy as CT could not exclude primary appendiceal carcinoma given enlarged lymph nodes and she has never had a colonoscopy before.   2. Persistent atrial fibrillation:  History of A.fib with CHADSVASC2 of 5 on chronic warfarin therapy. Rate controlled with diltiazem and metoprolol at home. She was noted to have 2 episodes of 2.2 and 2.9 second pauses on telemetry but  remains asymptomatic. Patient to continue diltiazem, metoprolol and warfarin as prescribed. She is to follow up with PCP and cardiologist for cardiac monitoring.   4. Hypothyroidism:  Patient w/ Hx of hypothyroidism on Armour thyroid 180mg  daily. She notes previously taking it with her other medications. Discussed that this should be taken 1 hour prior to breakfast and without any other medications. Patient expresses understanding.   Discharge Vitals:   BP 121/60   Pulse 87   Temp (!) 97.4 F (36.3 C) (Oral)   Resp 20   Wt 71.1 kg   SpO2 97%   BMI 30.62 kg/m   Pertinent Labs, Studies, and Procedures:  CBC Latest Ref Rng & Units 06/02/2019 06/01/2019 05/31/2019  WBC 4.0 - 10.5 K/uL 9.6 9.5 12.1(H)  Hemoglobin 12.0 - 15.0 g/dL 13.6 12.2 12.2  Hematocrit 36.0 - 46.0 % 42.0 37.4 36.9  Platelets 150 - 400 K/uL 313 259 248   CMP Latest Ref Rng & Units 06/02/2019 06/01/2019 05/31/2019  Glucose 70 - 99 mg/dL 96 114(H) 101(H)  BUN 8 - 23 mg/dL 19 22 16   Creatinine 0.44 - 1.00 mg/dL 1.11(H) 1.19(H) 0.97  Sodium 135 - 145 mmol/L 141 141 139  Potassium  3.5 - 5.1 mmol/L 3.3(L) 3.8 3.1(L)  Chloride 98 - 111 mmol/L 96(L) 100 94(L)  CO2 22 - 32 mmol/L 34(H) 33(H) 32  Calcium 8.9 - 10.3 mg/dL 9.1 8.4(L) 8.3(L)  Total Protein 6.5 - 8.1 g/dL - - -  Total Bilirubin 0.3 - 1.2 mg/dL - - -  Alkaline Phos 38 - 126 U/L - - -  AST 15 - 41 U/L - - -  ALT 0 - 44 U/L - - -   CHEST X-RAY 05/29/2019:  IMPRESSION: Cardiomegaly with mild pulmonary interstitial prominence which could be chronic or represent mild pulmonary venous congestion. Possible tiny left pleural effusion with bibasilar subsegmental atelectasis.  CT ABDOMEN PELVIS W CONTRAST 05/29/2019:  IMPRESSION: 1. Markedly dilated thick-walled appendix tracks laterally to the lateral abdominal wall where a 7 x 5 x 5 cm complex multi septated rim enhancing collection is identified. This is most likely a complex abscess secondary to appendiceal rupture that is  dissected into the abdominal wall. Less likely but not entirely excluded, necrotic neoplasm from primary appendiceal adenocarcinoma would be a possibility. Low-density mildly enlarged ileocolic node associated. No intraperitoneal free fluid. 2. Bilateral renal cysts including a 3.7 cm cyst in the upper pole right kidney with potential septation or posterior mural nodule. Nonemergent follow-up MRI in 3 months could be used to assess stability in further characterize. 3. 2.6 cm nodular opacity posterior right lower lobe may be scarring/rounded atelectasis, but close follow-up recommended to ensure stability as neoplasm cannot be excluded. 4.  Aortic Atherosclerois (ICD10-170.0)  CT GUIDED DRAINAGE OF PERIAPPENDICEAL ABSCESS 05/30/2019:  FINDINGS: Abscess in the right lower quadrant adjacent to the appendix. Drain was placed within the periappendiceal abscess. 15 mL of yellow purulent fluid was removed. IMPRESSION: CT-guided placement of a drain within the periappendiceal abscess.   Discharge Instructions: Discharge Instructions    Call MD for:  difficulty breathing, headache or visual disturbances   Complete by: As directed    Call MD for:  extreme fatigue   Complete by: As directed    Call MD for:  persistant dizziness or light-headedness   Complete by: As directed    Call MD for:  persistant nausea and vomiting   Complete by: As directed    Call MD for:  redness, tenderness, or signs of infection (pain, swelling, redness, odor or green/yellow discharge around incision site)   Complete by: As directed    Call MD for:  severe uncontrolled pain   Complete by: As directed    Call MD for:  temperature >100.4   Complete by: As directed    Diet - low sodium heart healthy   Complete by: As directed    Diet - low sodium heart healthy   Complete by: As directed    Discharge instructions   Complete by: As directed    Ms. Mcmenamin,  It was a pleasure caring for you. You were admitted  for perforated appendicitis with abscess formation. You had a drain placed for the abscess and were treated with antibiotics with clinical improvement.   On discharge, Please continue to take Augmentin (antibiotic) twice daily until 06/04/2019  For your drain:  - Flush each drain once daily with 5-10 cc NS flush (patient will need order for flushes upon discharge). - Record output from each drain once daily. - Follow-up at drain clinic 10-14 days after discharge for CT/possible drain injection   - Follow up with [facility] to make sure you are recovering well.  - Follow  up with Gastroenterologist (GI doctor) for colonoscopy.   For your Atrial fibrillation, Please continue to take your medications as prescribed. Please follow up with your cardiologist on discharge.   Increase activity slowly   Complete by: As directed    Increase activity slowly   Complete by: As directed       Signed: Harvie Heck, MD  Internal Medicine, PGY-1 Pager: [phone] 06/03/2019, 4:15 PM

## 2019-06-04 ENCOUNTER — Telehealth: Payer: Self-pay | Admitting: Family Medicine

## 2019-06-04 NOTE — Telephone Encounter (Deleted)
TRANSITIONAL CARE MANAGEMENT  TELEPHONE NOTE   Contact Date: 06/04/2019   Contacted By: Ralene Bathe         DISCHARGE INFORMATION  Date of Discharge: 06/02/19  Discharge Facility: [facility]  Principal Discharge Diagnosis:      Outpatient Follow Up Recommendations (from discharge summary)  Patient was transferred from clinic on 05/29/19 and was admitted due to appendicitis.  Due to her age a drain was placed.  Her daughter [name] states that there is less than 10 cc of output including the saline flushes Very little draingage She is having loose stools She is fever free and pain free    Ashley Dunn is a female primary care patient of Forrest Moron, MD An outgoing telephone call was made today and I spoke with [name] her daughter.  Ralene Bathe  condition(s) and treatment(s) were discussed. An opportunity to ask questions was provided and all were answered or forward as appropriate.        ACTIVITIES OF DAILY LIVING  Ashley Dunn  lives with their daughter and he can perform ADLs independently. her primary caregiver is [name]. She is able to depend on the them for consistent help.  Transportation to appointments, to pick up medications, and to run errands is not a problem.  (Consider referral to [facility] if transportation or a consistent caregiver is a problem)        Fall Risk  No flowsheet data found.  medium Fall Risk     Home Modifications/Assistive Devices  Wheelchair: No  Cane: No  Ramp: No  Bedside Toilet: No  Hospital Bed:  No  Other: [place]  She is not receiving home health services.         MEDICATION RECONCILIATION (fulfills Medicare 30 day post-discharge medication reconciliation requirement)  Disa Riedlinger has been able to pick-up all prescribed discharge medications from the pharmacy.     A post discharge medication reconciliation was performed and the complete medication list was  reviewed with the patient/caregiver and is current as of 05/30/2019. Changes highlighted below.     Discontinued Medications  none     Current Medication List Current Outpatient Medications  Medication Sig Dispense Refill  . amoxicillin-clavulanate (AUGMENTIN) 875-125 MG tablet Take 1 tablet by mouth 2 (two) times daily for 2 days. 4 tablet 0  . diltiazem (CARDIZEM CD) 240 MG 24 hr capsule Take 240 mg by mouth 2 (two) times daily.     . metoprolol succinate (TOPROL-XL) 25 MG 24 hr tablet Take 1 tablet (25 mg total) by mouth daily. (Patient taking differently: Take 25 mg by mouth daily. Take with 50mg  for a total of 75mg ) 90 tablet 3  . metoprolol succinate (TOPROL-XL) 50 MG 24 hr tablet Take 50 mg by mouth daily. Take with 25mg  for a total of 75mg     . thyroid (ARMOUR THYROID) 120 MG tablet Take 1 tablet (120 mg total) by mouth daily before breakfast. (Patient not taking: Reported on 05/30/2019) 90 tablet 1  . thyroid (ARMOUR THYROID) 60 MG tablet Take 60mg  by mouth daily and add to the 120mg  (Patient not taking: Reported on 05/30/2019) 90 tablet 1  . thyroid (ARMOUR) 180 MG tablet Take 180 mg by mouth daily.    Marland Kitchen torsemide (DEMADEX) 20 MG tablet Take 20 mg by mouth 2 (two) times daily.     Marland Kitchen warfarin (COUMADIN) 5 MG tablet Take 2.5-5 mg by mouth See admin  instructions. Take 2.5mg  Mon, Wed, Fri and Sun after supper. Take 5 MG Tuesday, Thursday and Saturday after supper. Or as directed by cardiologist Take 5 mg by mouth     No current facility-administered medications for this visit.     Allergies as of 05/30/2019  No Known Allergies        Medication List     as of May 30, 2019  9:52 PM     You have not been prescribed any medications.              PATIENT EDUCATION & FOLLOW-UP PLAN           An appointment for Transitional Care Management is scheduled with Forrest Moron, MD  on file. On 06/16/19.           Take all medications as prescribed            Contact our office by calling [phone] if you have any questions or concerns           Will need CBC

## 2019-06-04 NOTE — Telephone Encounter (Signed)
TRANSITIONAL CARE MANAGEMENT  TELEPHONE NOTE   Contact Date: 06/04/2019   Contacted By: Ralene Bathe         DISCHARGE INFORMATION  Date of Discharge: 06/02/19  Discharge Facility: [facility]  Principal Discharge Diagnosis:      Outpatient Follow Up Recommendations (from discharge summary)  Patient was transferred from clinic on 05/29/19 and was admitted due to appendicitis.  Due to her age a drain was placed.  Her daughter [name] states that there is less than 10 cc of output including the saline flushes Very little draingage She is having loose stools She is fever free and pain free    Lossie Kalp is a female primary care patient of Forrest Moron, MD An outgoing telephone call was made today and I spoke with [name] her daughter.  Ralene Bathe  condition(s) and treatment(s) were discussed. An opportunity to ask questions was provided and all were answered or forward as appropriate.        ACTIVITIES OF DAILY LIVING  Janne Faulk  lives with their daughter and he can perform ADLs independently. her primary caregiver is [name]. She is able to depend on the them for consistent help.  Transportation to appointments, to pick up medications, and to run errands is not a problem.  (Consider referral to [facility] CCM if transportation or a consistent caregiver is a problem)        Fall Risk  No flowsheet data found.  medium Fall Risk     Home Modifications/Assistive Devices  Wheelchair: No  Cane: No  Ramp: No  Bedside Toilet: No  Hospital Bed:  No  Other: [place]  She is not receiving home health services.         MEDICATION RECONCILIATION (fulfills Medicare 30 day post-discharge medication reconciliation requirement)  Anastasija Anfinson has been able to pick-up all prescribed discharge medications from the pharmacy.     A post discharge medication reconciliation was performed and the complete medication list was  reviewed with the patient/caregiver and is current as of 05/30/2019. Changes highlighted below.     Discontinued Medications  none     Current Medication List Current Outpatient Medications  Medication Sig Dispense Refill  . amoxicillin-clavulanate (AUGMENTIN) 875-125 MG tablet Take 1 tablet by mouth 2 (two) times daily for 2 days. 4 tablet 0  . diltiazem (CARDIZEM CD) 240 MG 24 hr capsule Take 240 mg by mouth 2 (two) times daily.     . metoprolol succinate (TOPROL-XL) 25 MG 24 hr tablet Take 1 tablet (25 mg total) by mouth daily. (Patient taking differently: Take 25 mg by mouth daily. Take with 50mg  for a total of 75mg ) 90 tablet 3  . metoprolol succinate (TOPROL-XL) 50 MG 24 hr tablet Take 50 mg by mouth daily. Take with 25mg  for a total of 75mg     . thyroid (ARMOUR THYROID) 120 MG tablet Take 1 tablet (120 mg total) by mouth daily before breakfast. (Patient not taking: Reported on 05/30/2019) 90 tablet 1  . thyroid (ARMOUR THYROID) 60 MG tablet Take 60mg  by mouth daily and add to the 120mg  (Patient not taking: Reported on 05/30/2019) 90 tablet 1  . thyroid (ARMOUR) 180 MG tablet Take 180 mg by mouth daily.    Marland Kitchen torsemide (DEMADEX) 20 MG tablet Take 20 mg by mouth 2 (two) times daily.     Marland Kitchen warfarin (COUMADIN) 5 MG tablet Take 2.5-5 mg by mouth See admin  instructions. Take 2.5mg  Mon, Wed, Fri and Sun after supper. Take 5 MG Tuesday, Thursday and Saturday after supper. Or as directed by cardiologist Take 5 mg by mouth     No current facility-administered medications for this visit.     Allergies as of 05/30/2019  No Known Allergies        Medication List     as of May 30, 2019  9:52 PM     You have not been prescribed any medications.              PATIENT EDUCATION & FOLLOW-UP PLAN           An appointment for Transitional Care Management is scheduled with Forrest Moron, MD  on file. On 06/16/19.           Take all medications as prescribed            Contact our office by calling [phone] if you have any questions or concerns           Will need CBC

## 2019-06-04 NOTE — Telephone Encounter (Signed)
TRANSITIONAL CARE MANAGEMENT  TELEPHONE NOTE   Contact Date: 06/04/2019   Contacted By: Ralene Bathe         DISCHARGE INFORMATION  Date of Discharge: 06/02/19  Discharge Facility: [facility]  Principal Discharge Diagnosis: Appendicitis     Outpatient Follow Up Recommendations (from discharge summary)  Patient was sent to the hospital on 05/29/19 and was admitted. She was discharged on 2/7 Her daughter Ashley Dunn states that there is less than 10 cc of output including the saline flushes Very little draingage She is having loose stools She is fever free and pain free    Ashley Dunn is a female primary care patient of No primary care provider on file.. An outgoing telephone call was made today and I spoke with Ashley Dunn.  Ralene Bathe condition(s) and treatment(s) were discussed. An opportunity to ask questions was provided and all were answered or forward as appropriate.        ACTIVITIES OF DAILY LIVING  Ashley Dunn lives with their daughter and he can perform ADLs independently. his primary caregiver is Ashley Dunn. he is able to depend on the them for consistent help.  Transportation to appointments, to pick up medications, and to run errands is not a problem.  (Consider referral to [facility] if transportation or a consistent caregiver is a problem)        Fall Risk  No flowsheet data found.  medium Fall Risk     Home Modifications/Assistive Devices  Wheelchair: No  Cane: No  Ramp: No  Bedside Toilet: No  Hospital Bed:  No  Other: [place]  he is not receiving home health  services.          MEDICATION RECONCILIATION (fulfills Medicare 30 day post-discharge medication reconciliation requirement)  Ashley Dunn has been able to pick-up all prescribed discharge medications from the pharmacy.     A post discharge medication reconciliation was performed and the complete medication list was reviewed with the patient/caregiver  and is current as of 05/30/2019. Changes highlighted below.     Discontinued Medications       Current Medication List  Allergies as of 05/30/2019  No Known Allergies    Current Outpatient Medications  Medication Sig Dispense Refill  . amoxicillin-clavulanate (AUGMENTIN) 875-125 MG tablet Take 1 tablet by mouth 2 (two) times daily for 2 days. 4 tablet 0  . diltiazem (CARDIZEM CD) 240 MG 24 hr capsule Take 240 mg by mouth 2 (two) times daily.     . metoprolol succinate (TOPROL-XL) 25 MG 24 hr tablet Take 1 tablet (25 mg total) by mouth daily. (Patient taking differently: Take 25 mg by mouth daily. Take with 50mg  for a total of 75mg ) 90 tablet 3  . metoprolol succinate (TOPROL-XL) 50 MG 24 hr tablet Take 50 mg by mouth daily. Take with 25mg  for a total of 75mg     . thyroid (ARMOUR THYROID) 120 MG tablet Take 1 tablet (120 mg total) by mouth daily before breakfast. (Patient not taking: Reported on 05/30/2019) 90 tablet 1  . thyroid (ARMOUR THYROID) 60 MG tablet Take 60mg  by mouth daily and add to the 120mg  (Patient not taking: Reported on 05/30/2019) 90 tablet 1  . thyroid (ARMOUR) 180 MG tablet Take 180 mg by mouth daily.    Marland Kitchen torsemide (DEMADEX) 20 MG tablet Take 20 mg by mouth 2 (two) times daily.     Marland Kitchen warfarin (COUMADIN) 5 MG tablet Take 2.5-5 mg by  mouth See admin instructions. Take 2.5mg  Mon, Wed, Fri and Sun after supper. Take 5 MG Tuesday, Thursday and Saturday after supper. Or as directed by cardiologist Take 5 mg by mouth     No current facility-administered medications for this visit.       Medication List     as of May 30, 2019  9:52 PM     You have not been prescribed any medications.              PATIENT EDUCATION & FOLLOW-UP PLAN           An appointment for Transitional Care Management is scheduled with Forrest Moron, MD on file. on 06/16/19.           Take all medications as prescribed           Contact our office by calling [phone] if  you have any questions or concerns

## 2019-06-04 NOTE — Telephone Encounter (Signed)
-----   Message from [name], MD sent at 06/02/2019  9:09 AM EST ----- Regarding: Discharged patient.  Needs follow-up in office. Ashley Dunn MRN: [id number] DOB/AGE: 84-Sep-1927 84 y.o.   Patient admitted to the Int Med / CCS consult DOW service at [facility] with pelvic abscess.  Improved status post percutaneous drainage.  Appendicitis the presumed etiology. History of chronic atrial fibrillation on warfarin.  Followed by Dr. Virgina Jock Being discharged from hospital with a drain and oral antibiotics.   She needs a follow-up drain clinic study with interventional radiology in 2-3 weeks to make sure the abscess has resolved and she has not developed a chronic fistula.She needs a colonoscopy in 6-8 weeks from now to make sure this is not from diverticulitis or cancer.  She has never had a colonoscopy.  [facility] since she is in the [facility].   Dr Tarri Glenn, or whomever is available  Plan is to most likely avoid interval appendectomy given her advanced age, lack of fecalith, and chronic atrial fibrillation with increased operative risks.  She is due for 50-month follow-up visit with cardiology this month anyway.  Probably plan not to do interval appendectomy unless patient feels strongly otherwise.  Would definitely want cardiac clearance before even considering that.  Requires follow-up in our office in ~3 weeks after drain study done With surgeon: Adin Hector    Active Problems:   Appendiceal abscess   PCP: Forrest Moron, MD  Outpatient Care Team: Patient Care Team: Forrest Moron, MD as PCP - General (Internal Medicine)    @RRDAYSPOSTSURGERY @  # No surgery found #    Admit date: @ADMITDT @ Discharge date: 06/02/2019   Allergies as of 06/02/2019   No Known Allergies   Med Rec must be completed prior to using this [facility]     Follow-up Information    [name], MD. Schedule an appointment as soon as possible for a  visit in 3  week(s).   Specialty: General Surgery Why: Follow-up after drain clinic visit Contact information: [address] [name] [address] [phone]

## 2019-06-05 ENCOUNTER — Telehealth: Payer: Self-pay | Admitting: *Deleted

## 2019-06-05 NOTE — Telephone Encounter (Signed)
Left detailed message on the patient phone (Daughter).  Will await return call to schedule f/u with APP or Dr. Tarri Glenn.

## 2019-06-05 NOTE — Telephone Encounter (Signed)
-----   Message from Thornton Park, MD sent at 06/05/2019 12:26 PM EST ----- Regarding: FW: Discharged patient.  Needs follow-up in office. Please schedule patient for office visit with me or APP as requested by Dr. Johney Maine on hospital discharge. Thanks.  KLB ----- Message ----- From: Nigel Mormon, MD Sent: 06/04/2019   1:58 PM EST To: [name], MD, Illene Regulus, # Subject: RE: Discharged patient.  Needs follow-up in #  Thank you for the heads up.  MJP  ----- Message ----- From: [name], MD Sent: 06/02/2019   9:09 AM EST To: [name], MD, Illene Regulus, # Subject: Discharged patient.  Needs follow-up in offi#  Ashley Dunn MRN: [id number] DOB/AGE: 07/24/1925 86 y.o.   Patient admitted to the Int Med / CCS consult DOW service at [facility] with pelvic abscess.  Improved status post percutaneous drainage.  Appendicitis the presumed etiology. History of chronic atrial fibrillation on warfarin.  Followed by Dr. Virgina Jock Being discharged from hospital with a drain and oral antibiotics.   She needs a follow-up drain clinic study with interventional radiology in 2-3 weeks to make sure the abscess has resolved and she has not developed a chronic fistula.She needs a colonoscopy in 6-8 weeks from now to make sure this is not from diverticulitis or cancer.  She has never had a colonoscopy.  [facility] since she is in the [facility] health [facility].   Dr Tarri Glenn, or whomever is available  Plan is to most likely avoid interval appendectomy given her advanced age, lack of fecalith, and chronic atrial fibrillation with increased operative risks.  She is due for 37-month follow-up visit with cardiology this month anyway.  Probably plan not to do interval appendectomy unless patient feels strongly otherwise.  Would definitely want cardiac clearance before even considering that.  Requires follow-up in our office in ~3 weeks after drain study done With surgeon: Adin Hector    Active Problems:   Appendiceal abscess   PCP: Forrest Moron, MD  Outpatient Care Team: Patient Care Team: Forrest Moron, MD as PCP - General (Internal Medicine)    @RRDAYSPOSTSURGERY @  # No surgery found #    Admit date: @ADMITDT @ Discharge date: 06/02/2019   Allergies as of 06/02/2019   No Known Allergies   Med Rec must be completed prior to using this [facility]     Follow-up Information    [name], MD. Schedule an appointment as soon as possible for a  visit in 3 week(s).   Specialty: General Surgery Why: Follow-up after drain clinic visit Contact information: [address] [name] [address] [phone]

## 2019-06-06 NOTE — Telephone Encounter (Signed)
Scheduled patient for f/u with JZ on 2/24 at 9 am per [place] (who was message by Dr. Johney Maine to get the patient in for follow up).

## 2019-06-11 ENCOUNTER — Other Ambulatory Visit: Payer: Self-pay | Admitting: Surgery

## 2019-06-11 ENCOUNTER — Ambulatory Visit
Admission: RE | Admit: 2019-06-11 | Discharge: 2019-06-11 | Disposition: A | Discharge status: Home / Self Care | Payer: Medicare Other | Source: Ambulatory Visit | Attending: Student | Admitting: Student

## 2019-06-11 ENCOUNTER — Other Ambulatory Visit: Payer: Self-pay | Admitting: Radiology

## 2019-06-11 ENCOUNTER — Ambulatory Visit
Admission: RE | Admit: 2019-06-11 | Discharge: 2019-06-11 | Disposition: A | Discharge status: Home / Self Care | Payer: Medicare Other | Source: Ambulatory Visit | Attending: Surgery | Admitting: Surgery

## 2019-06-11 DIAGNOSIS — K3533 Acute appendicitis with perforation and localized peritonitis, with abscess: Secondary | ICD-10-CM

## 2019-06-11 HISTORY — PX: IR RADIOLOGIST EVAL & MGMT: IMG5224

## 2019-06-11 MED ORDER — IOPAMIDOL (ISOVUE-300) INJECTION 61%
80.0000 mL | Freq: Once | INTRAVENOUS | Status: AC | PRN
  Administered 2019-06-11: 13:00:00 80 mL via INTRAVENOUS

## 2019-06-11 NOTE — Progress Notes (Addendum)
Referring Physician(s): Dr. Johney Maine  Chief Complaint: Abdominal pain periappendiceal abscess  History of present illness:  History of a fib (on coumadin) presented to [facility] ED with persistent abdominal pain X 1 month found to have a perforated appendix with a periappendical abscess. IR placed an abscess drain on 2.4.21. Per patient has completed her antibiotics (Augmenti)n on 2.9.21. Output has been been < 5 ml and per telephone encounter dated 2.10.21 Patient will be seen with Dr. Johney Maine 3 weeks post abscessogram.  Patient denies any fevers,nausea vomiting or abdominal paini Patient is flushing daily.    Past Medical History:  Diagnosis Date  . Arthritis   . Atrial fibrillation ([place])   . Cataract   . CHF (congestive heart failure) ([place])   . Hypertension   . Osteoporosis   . Thyroid disease     Past Surgical History:  Procedure Laterality Date  . CESAREAN SECTION    . EYE SURGERY    . FRACTURE SURGERY      Allergies: Patient has no known allergies.  Medications: Prior to Admission medications   Medication Sig Start Date End Date Taking? Authorizing Provider  diltiazem (CARDIZEM CD) 240 MG 24 hr capsule Take 240 mg by mouth 2 (two) times daily.  12/28/18   [provider]  metoprolol succinate (TOPROL-XL) 25 MG 24 hr tablet Take 1 tablet (25 mg total) by mouth daily. Patient taking differently: Take 25 mg by mouth daily. Take with 50mg  for a total of 75mg  05/14/19   Miquel Dunn, NP  metoprolol succinate (TOPROL-XL) 50 MG 24 hr tablet Take 50 mg by mouth daily. Take with 25mg  for a total of 75mg  01/21/19   [provider]  thyroid (ARMOUR THYROID) 120 MG tablet Take 1 tablet (120 mg total) by mouth daily before breakfast. Patient not taking: Reported on 05/30/2019 05/14/19   Forrest Moron, MD  thyroid [facility] THYROID) 60 MG tablet Take 60mg  by mouth daily and add to the 120mg  Patient not taking: Reported on 05/30/2019 05/29/19   Forrest Moron, MD    thyroid (ARMOUR) 180 MG tablet Take 180 mg by mouth daily.    [provider]  torsemide (DEMADEX) 20 MG tablet Take 20 mg by mouth 2 (two) times daily.  01/15/19   [provider]  warfarin (COUMADIN) 5 MG tablet Take 2.5-5 mg by mouth See admin instructions. Take 2.5mg  Mon, Wed, Fri and Sun after supper. Take 5 MG Tuesday, Thursday and Saturday after supper. Or as directed by cardiologist Take 5 mg by mouth 12/28/18   [provider]     Family History  Problem Relation Age of Onset  . Stroke Mother   . Cancer Father   . Hyperlipidemia Brother   . Healthy Daughter   . Healthy Son   . Heart disease Maternal Grandmother   . Heart disease Maternal Grandfather     Social History   Socioeconomic History  . Marital status: Widowed    Spouse name: Not on file  . Number of children: Not on file  . Years of education: Not on file  . Highest education level: Not on file  Occupational History  . Not on file  Tobacco Use  . Smoking status: Never Smoker  . Smokeless tobacco: Never Used  Substance and Sexual Activity  . Alcohol use: Never  . Drug use: Never  . Sexual activity: Not Currently  Other Topics Concern  . Not on file  Social History Narrative  Lives at home with duaghter   Social Determinants of Health   Financial Resource Strain:   . Difficulty of Paying Living Expenses: Not on file  Food Insecurity:   . Worried About Charity fundraiser in the Last Year: Not on file  . Ran Out of Food in the Last Year: Not on file  Transportation Needs:   . Lack of Transportation (Medical): Not on file  . Lack of Transportation (Non-Medical): Not on file  Physical Activity:   . Days of Exercise per Week: Not on file  . Minutes of Exercise per Session: Not on file  Stress:   . Feeling of Stress : Not on file  Social Connections:   . Frequency of Communication with Friends and Family: Not on file  . Frequency of Social Gatherings with Friends and  Family: Not on file  . Attends Religious Services: Not on file  . Active Member of Clubs or Organizations: Not on file  . Attends Archivist Meetings: Not on file  . Marital Status: Not on file     Vital Signs: There were no vitals taken for this visit.  Physical Exam Vitals and nursing note reviewed.  Constitutional:      Appearance: She is well-developed.  HENT:     Head: Normocephalic and atraumatic.  Eyes:     Conjunctiva/sclera: Conjunctivae normal.  Pulmonary:     Effort: Pulmonary effort is normal.  Musculoskeletal:        General: Normal range of motion.     Cervical back: Normal range of motion.  Skin:    General: Skin is warm.     Comments: Mild erythema noted around drain exit site with tenderness, no  drainage noted. Suture and stat lock in place. Dressing is clean dry and intact. 1 ml of  seriosangious colored fluid noted in the  bulb suction device.   Neurological:     Mental Status: She is alert and oriented to person, place, and time.     Imaging: No results found.  Labs:  CBC: Recent Labs    05/30/19 0247 05/31/19 0315 06/01/19 0327 06/02/19 0358  WBC 13.7* 12.1* 9.5 9.6  HGB 12.9 12.2 12.2 13.6  HCT 39.1 36.9 37.4 42.0  PLT 263 248 259 313    COAGS: Recent Labs    05/29/19 1715 05/29/19 1715 05/30/19 0247 05/31/19 0315 06/01/19 0327 06/02/19 0358  INR 1.5*   < > 1.5* 1.7* 2.0* 1.8*  APTT 32  --   --   --   --   --    < > = values in this interval not displayed.    BMP: Recent Labs    05/30/19 0247 05/31/19 0315 06/01/19 0327 06/02/19 0358  NA 141 139 141 141  K 3.4* 3.1* 3.8 3.3*  CL 96* 94* 100 96*  CO2 33* 32 33* 34*  GLUCOSE 101* 101* 114* 96  BUN 17 16 22 19   CALCIUM 8.6* 8.3* 8.4* 9.1  CREATININE 1.09* 0.97 1.19* 1.11*  GFRNONAA 44* 51* 40* 43*  GFRAA 51* 59* 46* 50*    LIVER FUNCTION TESTS: Recent Labs    02/06/19 1039 05/29/19 1223  BILITOT 0.6 1.0  AST 32 23  ALT 19 18  ALKPHOS 92 74  PROT  7.2 7.3  ALBUMIN 4.1 2.6*    Assessment: 86 y.o, female ourpatient. History of a fib (on coumadin) presented to [facility] ED with persistent abdominal pain X 1 month found to have a perforated  appendix with a periappendical abscess. IR placed an abscess drain on 2.4.21. Patient has completed her antibiotics (Augmentin) on 2.9. Output has been < 5 ml and per telephone encounter dated 2.10.21 Patient will be seen with Dr. Johney Maine 3 weeks post abscessogram.  Patient denies any fevers,nausea vomiting or abdominal paini Patient is flushing daily.  Abscessogram performed on 2.16.21 shows possible fistula to colon. Approximately 10 ml of serosanguinous/ phlegmon was able to be aspirated. Drain changed to gravity bag at this time  Pertinent Imaging 2.16.21 - CT abd pelvis  Pertinent IR History 2.4.21 - Abscess drain placement periappendiceal abscess  Pertinent Allergies NKDA  All labs are within acceptable parameters. Patient is on coumadin for a fib. Patient is afebrile.  After review of CT scan and abscessogram there appears to be a multilocualted abscess superior to existing abscess drain.  Recommend patient return to IR for drain reposition possible 2nd drain placement  Patient will need INR if procedure posted prior to scheduled INR clinic appointment on 2.18.21.  Patient and daughter instructed to go to the nearest emergency room for any signs or symptoms of infection including fevers, nausea, vomiting or abdominal pain.     Signed: Avel Peace, NP 06/11/2019, 12:59 PM   Please refer to Dr. Anselm Pancoast attestation of this note for management and plan.

## 2019-06-12 ENCOUNTER — Other Ambulatory Visit: Payer: Self-pay

## 2019-06-12 ENCOUNTER — Ambulatory Visit (HOSPITAL_COMMUNITY)
Admission: RE | Admit: 2019-06-12 | Discharge: 2019-06-12 | Disposition: A | Discharge status: Home / Self Care | Payer: Medicare Other | Source: Ambulatory Visit | Attending: Surgery | Admitting: Surgery

## 2019-06-12 DIAGNOSIS — K3533 Acute appendicitis with perforation and localized peritonitis, with abscess: Secondary | ICD-10-CM | POA: Diagnosis not present

## 2019-06-12 DIAGNOSIS — Z4803 Encounter for change or removal of drains: Secondary | ICD-10-CM | POA: Diagnosis present

## 2019-06-12 HISTORY — PX: IR CATHETER TUBE CHANGE: IMG717

## 2019-06-12 MED ORDER — FENTANYL CITRATE (PF) 100 MCG/2ML IJ SOLN
50.0000 ug | Freq: Once | INTRAMUSCULAR | Status: AC
  Administered 2019-06-12: 13:00:00 50 ug via INTRAVENOUS

## 2019-06-12 MED ORDER — LIDOCAINE HCL 1 % IJ SOLN
INTRAMUSCULAR | Status: DC | PRN
  Administered 2019-06-12: 10 mL

## 2019-06-12 MED ORDER — IOHEXOL 300 MG/ML  SOLN
50.0000 mL | Freq: Once | INTRAMUSCULAR | Status: AC | PRN
  Administered 2019-06-12: 13:00:00 10 mL

## 2019-06-12 MED ORDER — FENTANYL CITRATE (PF) 100 MCG/2ML IJ SOLN
INTRAMUSCULAR | Status: AC
  Filled 2019-06-12: qty 2

## 2019-06-12 MED ORDER — LIDOCAINE HCL 1 % IJ SOLN
INTRAMUSCULAR | Status: AC
  Filled 2019-06-12: qty 20

## 2019-06-13 ENCOUNTER — Ambulatory Visit: Payer: Medicare Other | Admitting: Cardiology

## 2019-06-17 ENCOUNTER — Other Ambulatory Visit: Payer: Self-pay

## 2019-06-17 ENCOUNTER — Telehealth: Payer: Self-pay | Admitting: Family Medicine

## 2019-06-17 ENCOUNTER — Encounter: Payer: Self-pay | Admitting: Family Medicine

## 2019-06-17 ENCOUNTER — Encounter: Payer: Self-pay | Admitting: Cardiology

## 2019-06-17 ENCOUNTER — Ambulatory Visit: Payer: Medicare Other | Admitting: Family Medicine

## 2019-06-17 ENCOUNTER — Ambulatory Visit: Payer: Medicare Other | Admitting: Cardiology

## 2019-06-17 VITALS — BP 123/81 | HR 124 | Temp 98.2°F | Ht 60.0 in | Wt 154.0 lb

## 2019-06-17 VITALS — BP 112/70 | HR 115 | Temp 98.6°F | Ht 60.0 in | Wt 153.0 lb

## 2019-06-17 DIAGNOSIS — Z09 Encounter for follow-up examination after completed treatment for conditions other than malignant neoplasm: Secondary | ICD-10-CM

## 2019-06-17 DIAGNOSIS — K3532 Acute appendicitis with perforation and localized peritonitis, without abscess: Secondary | ICD-10-CM

## 2019-06-17 DIAGNOSIS — I4821 Permanent atrial fibrillation: Secondary | ICD-10-CM

## 2019-06-17 DIAGNOSIS — E876 Hypokalemia: Secondary | ICD-10-CM

## 2019-06-17 DIAGNOSIS — I4811 Longstanding persistent atrial fibrillation: Secondary | ICD-10-CM

## 2019-06-17 DIAGNOSIS — R197 Diarrhea, unspecified: Secondary | ICD-10-CM

## 2019-06-17 DIAGNOSIS — D6869 Other thrombophilia: Secondary | ICD-10-CM

## 2019-06-17 DIAGNOSIS — Z01818 Encounter for other preprocedural examination: Secondary | ICD-10-CM

## 2019-06-17 LAB — POCT CBC
Granulocyte percent: 7.1 %G — AB (ref 37–80)
HCT, POC: 44 % — AB (ref 29–41)
Hemoglobin: 15.2 g/dL — AB (ref 11–14.6)
Lymph, poc: 16.2 — AB (ref 0.6–3.4)
MCH, POC: 30.7 pg (ref 27–31.2)
MCHC: 34.5 g/dL (ref 31.8–35.4)
MCV: 89 fL (ref 76–111)
MID (cbc): 76.8 — AB (ref 0–0.9)
MPV: 8.9 fL (ref 0–99.8)
POC Granulocyte: 0.6 — AB (ref 2–6.9)
POC LYMPH PERCENT: 7 %L — AB (ref 10–50)
POC MID %: 1.5 %M (ref 0–12)
Platelet Count, POC: 244 10*3/uL (ref 142–424)
RBC: 4.95 M/uL (ref 4.04–5.48)
RDW, POC: 15.1 %
WBC: 9.2 10*3/uL (ref 4.6–10.2)

## 2019-06-17 LAB — POCT INR: INR: 2.3 (ref 2.0–3.0)

## 2019-06-17 NOTE — Progress Notes (Signed)
Patient referred by Ashley Moron, MD for atrial fibrillation  Subjective:   Ashley Dunn, female    DOB: 10-19-1925, 86 y.o.   MRN: [id number]   Chief Complaint  Patient presents with  . Atrial Fibrillation  . Follow-up    3 month  . Coagulation Disorder    1 month    HPI  86 y.o. Caucasian female with hyperthyroidism with hyperthyroidism, controlled hypertension, persistent atrial flutter/fibrillation, h/o congestive heart failure.   Patient was admitted to [facility] in February 2021 with pelvic abscess.  Which improved with percutaneous drainage.  Appendicitis is the presumed etiology.  She needs a follow-up drain clinic study with interventional radiology to ensure resolution of the abscess and to look for any formation of chronic fistula.  She will also need a colonoscopy to rule out diverticulitis or cancer as the etiology.  Eventually, the surgeons would like to avoid interval appendectomy given her advanced age and comorbidities.    Patient is here with her daughter.  She does not have any cardiac complaints, denies chest pain, shortness of breath, palpitations, leg edema, orthopnea, PND, TIA/syncope. She currently has a drain in place.    Initial consultation HPI 02/2019: Patient is here today with her daughter Ashley Dunn, who lives with her.  Both patient and her doctor moved from [place] to [place] earlier this summer.  Patient is quite active and independent for her age.  She walks around with a walker.  She has had diagnosis of atrial fibrillation since 2014.  She was previously seeing a cardiologist, Dr. Dellia Dunn in [place], [place].  She has been on diltiazem XL to 40 mg 1 capsule twice daily, and metoprolol succinate 25 mg in a.m. and 50 mg in p.m. for quite some time.  Although unusual dosing, she has tolerated this well.  In fact, she tells me that she had tachycardia in 120s then attempt to reduce diltiazem 240 mg to once daily.  She is also been on warfarin  with well-controlled INR levels for a long time.  She has not had any previous cardioversion.  She has minimal to no symptoms in terms of palpitations, chest pain, shortness of breath with exertion.  She takes torsemide 20 mg 2 tablets daily, with no significant residual leg edema.  She denies any orthopnea, PND.  Her biggest complaint at this time is regarding her bowel movements.  She is seeing her PCP for the same.  She has hemorrhoids that bleed "once in a while".  She denies any melena, hematochezia, hematemesis.  She has arthritis, for which she occasionally takes Tylenol and does not use any NSAIDs.  Patient gets her medications through a mail order pharmacy.[facility] [phone]. She currently does not need any refills.   Current Outpatient Medications on File Prior to Visit  Medication Sig Dispense Refill  . diltiazem (CARDIZEM CD) 240 MG 24 hr capsule Take 240 mg by mouth 2 (two) times daily.     . metoprolol succinate (TOPROL-XL) 25 MG 24 hr tablet Take 1 tablet (25 mg total) by mouth daily. (Patient taking differently: Take 25 mg by mouth daily. Take with 69m for a total of 741m 90 tablet 3  . metoprolol succinate (TOPROL-XL) 50 MG 24 hr tablet Take 50 mg by mouth daily. Take with 2515mor a total of 37m41m . thyroid (ARMOUR THYROID) 120 MG tablet Take 1 tablet (120 mg total) by mouth daily before breakfast. 90 tablet 1  . thyroid (ARMOUR THYROID)  60 MG tablet Take 87m by mouth daily and add to the 1241m90 tablet 1  . torsemide (DEMADEX) 20 MG tablet Take 20 mg by mouth 2 (two) times daily.     . Marland Kitchenarfarin (COUMADIN) 5 MG tablet Take 2.5-5 mg by mouth See admin instructions. Take 2.42m42mon, Wed, Fri and Sun after supper. Take 5 MG Tuesday, Thursday and Saturday after supper. Or as directed by cardiologist Take 5 mg by mouth     No current facility-administered medications on file prior to visit.    Cardiovascular studies:  EKG 02/27/2019: Atypical atrial flutter  with variable AV conduction, ventricular rate 96 bpm. Nonspecific ST depression  -Nondiagnostic.   Recent labs: 06/17/2019: H/H 15/44. MCV 89.   06/02/2019: Glucose 96, BUN/Cr 19/1.1. EGFR 43. Na/K 141/3.3.   05/29/2019: BNP 345  04/29/2019: TSH 0.043, free T4 1.5   Review of Systems  Cardiovascular: Negative for chest pain, dyspnea on exertion, leg swelling, palpitations and syncope.         Vitals:   06/17/19 1519  BP: 123/81  Pulse: (!) 124  Temp: 98.2 F (36.8 C)  SpO2: 96%     Body mass index is 30.08 kg/m. Filed Weights   06/17/19 1519  Weight: 154 lb (69.9 kg)     Objective:   Physical Exam  Constitutional: She appears well-developed and well-nourished.  Neck: No JVD present.  Cardiovascular: Intact distal pulses. An irregularly irregular rhythm present. Tachycardia present.  No murmur heard. Pulmonary/Chest: Effort normal and breath sounds normal. She has no wheezes. She has no rales.  Musculoskeletal:        General: No edema.  Nursing note and vitals reviewed.       Assessment & Recommendations:    [age]. Caucasian female with hyperthyroidism, controlled hypertension, persistent atrial flutter/fibrillation, h/o congestive heart failure.    Permanent atrial fibrillation/flutter:  She is on rather high doses of diltiazem XL 240 mg twice daily, metoprolol succinate 25 mg a.m., and 50 mg p.m.  It appears that she has been on these doses for several years and tolerating well. Given her recent increase in HR, likely due to ongoing infection, I have increased her metoprolol tartarate temporarily to 50 mg bid. Given her advanced age, lack of symptoms, I will treat this as permanent atrial flutter/fibrillation without any attempt to restore sinus rhythm.  CHA2DS2VASc score 3, annual stroke risk 3.2%. Goal: 2.0-3.0 Indication: Afib Today's INR: 2.3 Current dose: 5 mg T, TH, Sa, 2.5 mg all other days  New dose: Same  Next INR check: 4 weeks  Pre-op  risk stratification: Perioperative cardiac risk events elevated by virtue of her advanced age.  She does not have any active angina or dyspnea symptoms.  I will only obtain an echocardiogram for baseline evaluation.  Do not recommend stress testing.  ManNigel MormonD [facility]Rock[facility]. PA Pager: [phone]: [phone]

## 2019-06-17 NOTE — Telephone Encounter (Signed)
[facility] called to verify patient had cbc and bmp done at her visit today

## 2019-06-17 NOTE — Progress Notes (Signed)
TRANSITION OF CARE VISIT   Primary Care Physician (PCP): Forrest Moron, MD                                                    [address], [place] [address]    Date of Admission: 05/29/19  Date of Discharge: 06/02/19  Discharged from: [facility]  Discharge Diagnosis: Perforated appendicitis s/p drainage  Summary of Admission:   Patient was admitted with perforated appendicitis on 05/29/19.  She had a loculated peritoneal abscess and had IR drainage with gram neg rods on culture.  She was treated with zosyn and transitioned to Augmentin for discharge.  Due to her age and A. Fib she was not taken to the OR.  Her daughter is her caregiver.   TODAY's VISIT  Patient/Caregiver self-reported problems/concerns:   Since her postop follow up on Wednesday 06/13/19 who repositioned the drain to help to continue to drain the abdominal abscess. The tube was changed. She had a new drain with very little output She states that she was having pressure in her rectum and she has multiple rounds of copious watery non-bloody diarrhea since her discharge home.   Past Medical History:  Diagnosis Date  . Arthritis   . Atrial fibrillation ([place])   . Cataract   . CHF (congestive heart failure) ([place])   . Hypertension   . Osteoporosis   . Thyroid disease    No Known Allergies Past Surgical History:  Procedure Laterality Date  . CESAREAN SECTION    . EYE SURGERY    . FRACTURE SURGERY    . IR CATHETER TUBE CHANGE  06/12/2019  . IR RADIOLOGIST EVAL & MGMT  06/11/2019    MEDICATIONS Current Outpatient Medications  Medication Sig Dispense Refill  . diltiazem (CARDIZEM CD) 240 MG 24 hr capsule Take 240 mg by mouth 2 (two) times daily.     . metoprolol succinate (TOPROL-XL) 25 MG 24 hr tablet Take 1 tablet (25 mg total) by mouth daily. (Patient taking differently: Take 25 mg by mouth daily. Take with 50mg  for a total of 75mg ) 90  tablet 3  . metoprolol succinate (TOPROL-XL) 50 MG 24 hr tablet Take 50 mg by mouth daily. Take with 25mg  for a total of 75mg     . thyroid (ARMOUR THYROID) 120 MG tablet Take 1 tablet (120 mg total) by mouth daily before breakfast. 90 tablet 1  . thyroid (ARMOUR THYROID) 60 MG tablet Take 60mg  by mouth daily and add to the 120mg  90 tablet 1  . torsemide (DEMADEX) 20 MG tablet Take 20 mg by mouth 2 (two) times daily.     Marland Kitchen warfarin (COUMADIN) 5 MG tablet Take 2.5-5 mg by mouth See admin instructions. Take 2.5mg  Mon, Wed, Fri and Sun after supper. Take 5 MG Tuesday, Thursday and Saturday after supper. Or as directed by cardiologist Take 5 mg by mouth     No current facility-administered medications for this visit.    Medication Reconciliation conducted with patient/caregiver? (Yes/ No):yes  New medications  prescribed/discontinued upon discharge? (Yes/No): yes  Barriers identified related to medications: none  LABS  Lab Reviewed (Yes/No/NA): yes Lab Results  Component Value Date   WBC 9.2 06/17/2019   HGB 15.2 (A) 06/17/2019   HCT 44.0 (A) 06/17/2019   MCV 89.0 06/17/2019   PLT 313 06/02/2019     Chemistry      Component Value Date/Time   NA 141 06/02/2019 0358   NA 144 02/06/2019 1039   K 3.3 (L) 06/02/2019 0358   CL 96 (L) 06/02/2019 0358   CO2 34 (H) 06/02/2019 0358   BUN 19 06/02/2019 0358   BUN 39 (H) 02/06/2019 1039   CREATININE 1.11 (H) 06/02/2019 0358      Component Value Date/Time   CALCIUM 9.1 06/02/2019 0358   ALKPHOS 74 05/29/2019 1223   AST 23 05/29/2019 1223   ALT 18 05/29/2019 1223   BILITOT 1.0 05/29/2019 1223   BILITOT 0.6 02/06/2019 1039        PHYSICAL EXAM:  Vitals:   06/17/19 1003  BP: 112/70  Pulse: (!) 115  Temp: 98.6 F (37 C)  TempSrc: Temporal  SpO2: 92%  Weight: 153 lb (69.4 kg)  Height: 5' (1.524 m)    Physical Exam  Constitutional: Oriented to person, place, and time. Appears well-developed and well-nourished.  HENT:  Head:  Normocephalic and atraumatic.  Eyes: Conjunctivae and EOM are normal.  Cardiovascular:  Sinus tachycardia Pulmonary/Chest: Effort normal and breath sounds normal. No stridor. No respiratory distress. Has no wheezes.  Abdomen: nondistended, drain visible and the drainage is scant serosanguinous fluid, hyperactive bs, soft, nontender Neurological: Is alert and oriented to person, place, and time.  Skin: Skin is warm. Capillary refill takes less than 2 seconds.  Psychiatric: Has a normal mood and affect. Behavior is normal. Judgment and thought content normal.   ASSESSMENT:  Problem List Items Addressed This Visit    None    Visit Diagnoses    Perforated appendicitis    -  Primary Pt currently improving and is being managed by Gen Surgery Discussed reasons to present for urgent follow up    Relevant Orders   POCT CBC (Completed)   Basic metabolic panel   Clostridium Difficile by PCR   Diarrhea of presumed infectious origin    - due to recent hospitalization and antibiotics will check c. diff   Relevant Orders   Clostridium Difficile by [facility] discharge follow-up    - reviewed hospital course and follow up testing   Hypokalemia    - repleted in hospital, will assess    Longstanding persistent atrial fibrillation ([place])    - continue anticoagulation Acquired thrombophilia       PATIENT EDUCATION PROVIDED: See AVS   FOLLOW-UP (Include any further testing or referrals):   Will follow up on potassium, repeated CBC with diff

## 2019-06-17 NOTE — Patient Instructions (Addendum)
If you have lab work done today you will be contacted with your lab results within the next 2 weeks.  If you have not heard from [place] then please contact us. The fastest way to get your results is to register for My Chart.   IF you received an x-ray today, you will receive an invoice from [facility]. Please contact [facility] at [phone] with questions or concerns regarding your invoice.   IF you received labwork today, you will receive an invoice from [facility]. Please contact [facility] at [phone] with questions or concerns regarding your invoice.   Our billing staff will not be able to assist you with questions regarding bills from these companies.  You will be contacted with the lab results as soon as they are available. The fastest way to get your results is to activate your My Chart account. Instructions are located on the last page of this paperwork. If you have not heard from [place] regarding the results in 2 weeks, please contact this office.      [email or web address]  Clostridioides Difficile Infection Clostridioides difficile infection, also known as C. difficile or C. diff infection, happens when too much C. diff bacteria grows and causes inflammation of the colon (colitis). It is linked to recent use of antibiotic medicine. This infection can be passed from person to person (is contagious). You may also be exposed to the bacteria in the environment, such as from contact with food, water, or surfaces that have the bacteria on them. What are the causes? Certain bacteria live in the colon and help to digest food. This infection develops when the balance of helpful bacteria in the colon changes and the C. diff bacteria grow out of control. This is caused by taking antibiotics. What increases the risk? The following factors may make you more likely to develop this condition:  Taking certain antibiotics that kill many types of bacteria or  taking antibiotics for a long time.  Having an extended stay in health care settings, such as hospitals and long-term care facilities.  Being older than age 44.  Having had a C. diff infection before or a known exposure to C. diff bacteria.  Having a weak disease-fighting system (immune system).  Taking a medicine to reduce stomach acid, such as a proton pump inhibitor, for a long time.  Having a serious underlying condition, such as colon cancer or inflammatory bowel disease (IBD).  Having had a gastrointestinal (GI) tract procedure or surgery. Some people develop this infection even though they do not have any known risk factors. What are the signs or symptoms? Symptoms of this condition include:  Diarrhea (three or more times a day) for several days.  Fever.  Tiredness (fatigue).  Loss of appetite.  Nausea.  Swelling, pain, cramping, or tenderness in the abdomen. How is this diagnosed? This condition is diagnosed with:  Your medical history and a physical exam.  Tests, which may include: ? A test for C. diff in your stool (feces). ? Blood tests. ? Imaging tests, such as a CT scan of your abdomen.  A procedure in which your health care provider can look inside your colon. This is rare. How is this treated? Treatment for this condition may include:  Stopping the antibiotics that you were taking when the C. diff infection began. Do this only as told by your health care provider.  Taking certain antibiotics to stop C. diff growth.  Taking donor stool from a healthy person and placing it  into the colon (fecal transplant). This may be done if the infection keeps coming back.  Having surgery to remove the infected part of the colon. This is rare. Follow these instructions at home: Medicines  Take over-the-counter and prescription medicines only as told by your health care provider.  Take your antibiotic medicine as told by your health care provider. Do not stop  taking the antibiotic even if you start to feel better.  Do not treat diarrhea with medicines unless your health care provider tells you to. Eating and drinking   Follow instructions from your health care provider about eating or drinking restrictions.  Eat foods that are bland and easy to digest in small amounts as you can. These foods include bananas, applesauce, rice, lean meats, toast, and crackers.  Follow instructions on how to replace body fluid that has been lost (rehydrate). This may include: ? Drinking clear fluids, such as water, clear fruit juice, and low-calorie sports drinks. ? Sucking on ice chips. ? Taking an oral rehydration solution (ORS). This drink is sold at pharmacies and retail stores.  Avoid milk, caffeine, and alcohol.  Drink enough fluid to keep your urine pale yellow. Activity  Rest as told by your health care provider.  Return to your normal activities as told by your health care provider. Ask your health care provider what activities are safe for you. General instructions  Wash your hands often with soap and water for at least 20 seconds. Bathe using soap and water daily.  Be sure your home is clean before you leave the hospital or clinic to go home. Continue daily cleaning for at least a week after going home.  Keep all follow-up visits as told by your health care provider. This is important. How is this prevented? Hand hygiene   Wash your hands thoroughly with soap and water for at least 20 seconds before preparing food and after using the bathroom. Make sure the people you live with also wash their hands often with soap and water for at least 20 seconds.  If you are being treated at a hospital or clinic, make sure that all health care providers and visitors wash their hands with soap and water before touching you. Contact precautions  If you develop diarrhea while in the hospital or a long-term care facility, tell your health care team right  away.  When visiting someone in the hospital or a long-term care facility, follow guidelines for wearing a gown, gloves, or other protective equipment.  If possible, avoid contact with people who have diarrhea.  If you are sick and live with other people, use a separate bathroom, if possible. Clean environment  Clean surfaces that are touched often every day. C. diff bacteria are very resistant to cleaning and can live for months on surfaces. The bacteria are killed only by cleaning products containing 10%-solution chlorine bleach. Be sure to: ? Read the manufacturer's instructions or read online resources to find out if the product you are using will work for the surface you are cleaning. ? Clean frequently touched surfaces, such as toilets (remember the flush handle), bathtubs, sinks, door knobs, and work surfaces.  If you are in the hospital, make sure that staff members clean the surfaces in your room daily. Let a staff person know right away if body fluids have splashed or spilled. Washing clothes and linens  Use a laundry detergent containing chlorine bleach. Be sure to use powder detergent instead of liquid. Only some liquid detergents have bleaching  agents that contain chlorine bleach. Powder detergents contain chlorine bleach in low levels to help kill any bacteria.  Run your washing machine on the hot setting once a month without clothes or linens but with enough detergent for washing at full capacity. This will kill any remaining C. diff bacteria. Contact a health care provider if:  Your symptoms do not get better, or they get worse, even with treatment.  Your symptoms go away and then come back.  You have a fever.  You develop new symptoms. Get help right away if:  You have more pain or tenderness in your abdomen.  You have stool that is mostly bloody, or your stool looks dark black and tarry.  You cannot eat or drink without vomiting.  You have signs or symptoms of  dehydration, such as: ? Dark urine, very little urine, or no urine. ? Cracked lips or dry mouth. ? Not making tears when you cry. ? Sunken eyes. ? Sleepiness. ? Weakness or dizziness. Summary  Clostridioides difficile, or C. diff, infection happens when too much C. diff bacteria grows and causes inflammation of the colon (colitis) after antibiotic use.  Symptoms of this infection include diarrhea, fever, tiredness (fatigue), loss of appetite, nausea, and symptoms affecting the abdomen.  This infection may be treated by stopping the antibiotics you were using when the infection began, and then taking certain antibiotics to stop C. diff from growing. Sometimes, fecal transplant or surgery is done.  Handwashing, contact precautions, a clean environment, and washing clothes and linens can help prevent or limit spread of this infection. This information is not intended to replace advice given to you by your health care provider. Make sure you discuss any questions you have with your health care provider. Document Revised: 09/05/2018 Document Reviewed: 09/05/2018 Elsevier Patient Education  [employer].

## 2019-06-18 LAB — CBC WITH DIFFERENTIAL/PLATELET
Basophils Absolute: 0.1 10*3/uL (ref 0.0–0.2)
Basos: 1 %
EOS (ABSOLUTE): 0.1 10*3/uL (ref 0.0–0.4)
Eos: 1 %
Hematocrit: 47.2 % — ABNORMAL HIGH (ref 34.0–46.6)
Hemoglobin: 15.7 g/dL (ref 11.1–15.9)
Immature Grans (Abs): 0 10*3/uL (ref 0.0–0.1)
Immature Granulocytes: 0 %
Lymphocytes Absolute: 1.4 10*3/uL (ref 0.7–3.1)
Lymphs: 16 %
MCH: 30.8 pg (ref 26.6–33.0)
MCHC: 33.3 g/dL (ref 31.5–35.7)
MCV: 93 fL (ref 79–97)
Monocytes Absolute: 1.1 10*3/uL — ABNORMAL HIGH (ref 0.1–0.9)
Monocytes: 12 %
Neutrophils Absolute: 6.1 10*3/uL (ref 1.4–7.0)
Neutrophils: 70 %
Platelets: 272 10*3/uL (ref 150–450)
RBC: 5.09 x10E6/uL (ref 3.77–5.28)
RDW: 13 % (ref 11.7–15.4)
WBC: 8.7 10*3/uL (ref 3.4–10.8)

## 2019-06-18 LAB — BASIC METABOLIC PANEL
BUN/Creatinine Ratio: 19 (ref 12–28)
BUN: 29 mg/dL (ref 10–36)
CO2: 27 mmol/L (ref 20–29)
Calcium: 9 mg/dL (ref 8.7–10.3)
Chloride: 94 mmol/L — ABNORMAL LOW (ref 96–106)
Creatinine, Ser: 1.51 mg/dL — ABNORMAL HIGH (ref 0.57–1.00)
GFR calc Af Amer: 35 mL/min/{1.73_m2} — ABNORMAL LOW (ref >59)
GFR calc non Af Amer: 30 mL/min/{1.73_m2} — ABNORMAL LOW (ref >59)
Glucose: 110 mg/dL — ABNORMAL HIGH (ref 65–99)
Potassium: 3.4 mmol/L — ABNORMAL LOW (ref 3.5–5.2)
Sodium: 142 mmol/L (ref 134–144)

## 2019-06-19 ENCOUNTER — Encounter: Payer: Self-pay | Admitting: Gastroenterology

## 2019-06-19 ENCOUNTER — Ambulatory Visit (INDEPENDENT_AMBULATORY_CARE_PROVIDER_SITE_OTHER): Payer: Medicare Other | Admitting: Gastroenterology

## 2019-06-19 VITALS — BP 108/60 | HR 82 | Temp 97.3°F | Ht 60.0 in | Wt 152.0 lb

## 2019-06-19 DIAGNOSIS — K3533 Acute appendicitis with perforation and localized peritonitis, with abscess: Secondary | ICD-10-CM

## 2019-06-19 NOTE — Progress Notes (Signed)
06/19/2019 Jera Headings [id number] 1926-03-19   HISTORY OF PRESENT ILLNESS: This is a 86 year old female who is new to our office.  She is here today at the request of Dr. Johney Maine to discuss possible colonoscopy to "rule out diverticulitis or malignancy".  Referred to Dr. Tarri Glenn.  She was recently hospitalized a couple of weeks ago for some right lower quadrant abdominal pain was found to have appendicitis with periappendiceal abscess.  Placed on antibiotics and seen by IR for drain placement.  Still has a drain in place currently.  She says that her pain has resolved.  She has never had colonoscopy in the past.  Tells me she had a Cologuard about 4 years ago that was negative.  She was not having any GI issues leading up to this except for a rare diarrhea "blowout", but otherwise moves her bowels regularly.  Does not see blood in her stools.  She is on Coumadin for atrial fibrillation.  Her daughter was with her at her visit today.   Past Medical History:  Diagnosis Date  . Arthritis   . Atrial fibrillation ([place])   . Cataract   . CHF (congestive heart failure) ([place])   . Hypertension   . Osteoporosis   . Thyroid disease    Past Surgical History:  Procedure Laterality Date  . CESAREAN SECTION    . EYE SURGERY    . FRACTURE SURGERY    . IR CATHETER TUBE CHANGE  06/12/2019  . IR RADIOLOGIST EVAL & MGMT  06/11/2019    reports that she has never smoked. She has never used smokeless tobacco. She reports that she does not drink alcohol or use drugs. family history includes Cancer in her father; Healthy in her daughter and son; Heart disease in her maternal grandfather and maternal grandmother; Hyperlipidemia in her brother; Stroke in her mother. No Known Allergies    Outpatient Encounter Medications as of 06/19/2019  Medication Sig  . diltiazem (CARDIZEM CD) 240 MG 24 hr capsule Take 240 mg by mouth 2 (two) times daily.   . metoprolol succinate (TOPROL-XL) 25 MG 24 hr tablet Take 1  tablet (25 mg total) by mouth daily. (Patient taking differently: Take 25 mg by mouth daily. Take with 50mg  for a total of 75mg )  . metoprolol succinate (TOPROL-XL) 50 MG 24 hr tablet Take 50 mg by mouth daily. Take with 25mg  for a total of 75mg   . thyroid (ARMOUR THYROID) 120 MG tablet Take 1 tablet (120 mg total) by mouth daily before breakfast.  . thyroid (ARMOUR THYROID) 60 MG tablet Take 60mg  by mouth daily and add to the 120mg   . torsemide (DEMADEX) 20 MG tablet Take 20 mg by mouth 2 (two) times daily.   Marland Kitchen warfarin (COUMADIN) 5 MG tablet Take 2.5-5 mg by mouth See admin instructions. Take 2.5mg  Mon, Wed, Fri and Sun after supper. Take 5 MG Tuesday, Thursday and Saturday after supper. Or as directed by cardiologist Take 5 mg by mouth   No facility-administered encounter medications on file as of 06/19/2019.     REVIEW OF SYSTEMS  : All other systems reviewed and negative except where noted in the History of Present Illness.   PHYSICAL EXAM: BP 108/60   Pulse 82   Temp (!) 97.3 F (36.3 C)   Ht 5' (1.524 m)   Wt 152 lb (68.9 kg)   BMI 29.69 kg/m  General: Well developed white female in no acute distress Head: Normocephalic and atraumatic Eyes:  Sclerae  anicteric, conjunctiva pink. Ears: Normal auditory acuity Lungs: Clear throughout to auscultation; no increased WOB Heart:  Irregularly irregular. Abdomen: Soft, non-distended.  BS present.  Non-tender.  Drain and bag noted with minimal serosanguinous fluid in tube. Musculoskeletal: Symmetrical with no gross deformities  Skin: No lesions on visible extremities Extremities: No edema  Neurological: Alert oriented x 4, grossly non-focal Psychological:  Alert and cooperative. Normal mood and affect  ASSESSMENT AND PLAN: *86 year old female with recent appendicitis and appendiceal abscess.  Drain still in place as this all just transpired a couple of weeks ago.  She has a C. difficile stool study pending, but I think that her  diarrhea was likely from antibiotic side effects rather than C. difficile as her stools have started to firm up.  We are not planning to proceed with colonoscopy at this time.  She is high risk with her advanced age and atrial fibrillation requiring Coumadin.  Once again, she has a C. difficile stool study pending and still has her perc drain in place.  She has never had a colonoscopy in the past and is not interestedunless absolutely necessary.  CT scan did not indicate any masslike findings.  She reports a negative Cologuard about 4 years ago.  She is feeling much better at this point.  Was not having any type of GI issues leading up to this.   CC:  Forrest Moron, MD

## 2019-06-19 NOTE — Patient Instructions (Addendum)
If you are age 84 or older, your body mass index should be between 23-30. Your Body mass index is 29.69 kg/m. If this is out of the aforementioned range listed, please consider follow up with your Primary Care Provider.  If you are age 68 or younger, your body mass index should be between 19-25. Your Body mass index is 29.69 kg/m. If this is out of the aformentioned range listed, please consider follow up with your Primary Care Provider.   Discussed colonoscopy today and we will hold off for now. Please follow up if needed.   Thank you for choosing me and [facility].  Alonza Bogus, PA-C

## 2019-06-19 NOTE — Progress Notes (Signed)
Reviewed with PA Zehr.    Arhan Mcmanamon L. Tarri Glenn, MD, MPH

## 2019-06-20 LAB — CLOSTRIDIUM DIFFICILE BY PCR: Toxigenic C. Difficile by PCR: POSITIVE — AB

## 2019-06-21 ENCOUNTER — Other Ambulatory Visit: Payer: Self-pay | Admitting: Family Medicine

## 2019-06-21 ENCOUNTER — Telehealth: Payer: Self-pay | Admitting: Family Medicine

## 2019-06-21 MED ORDER — VANCOMYCIN HCL 125 MG PO CAPS: 125.0000 mg | ORAL_CAPSULE | Freq: Four times a day (QID) | ORAL | 0 refills | Status: AC

## 2019-06-21 NOTE — Progress Notes (Signed)
E Spoke to the daughter of pt, says she will make sure to keep attn of th movements mother is having

## 2019-06-21 NOTE — Telephone Encounter (Signed)
Discussed with the patient's daughter [name] about the c. Diff result.  She was made aware of the vancomycin oral. Discussed that she should get her INR rechecked sooner after finishing the antibiotic.  If diarrhea increases then please contact the office to discuss.   Current state: no diarrhea

## 2019-06-21 NOTE — Telephone Encounter (Signed)
Noted  

## 2019-06-24 ENCOUNTER — Telehealth: Payer: Self-pay | Admitting: Family Medicine

## 2019-06-24 NOTE — Telephone Encounter (Signed)
Pt daughter called messed a call about lab results would like to get a call back. [phone]

## 2019-06-25 ENCOUNTER — Ambulatory Visit
Admission: RE | Admit: 2019-06-25 | Discharge: 2019-06-25 | Disposition: A | Discharge status: Home / Self Care | Payer: Medicare Other | Source: Ambulatory Visit | Attending: Surgery | Admitting: Surgery

## 2019-06-25 DIAGNOSIS — K3533 Acute appendicitis with perforation and localized peritonitis, with abscess: Secondary | ICD-10-CM

## 2019-06-25 HISTORY — PX: IR RADIOLOGIST EVAL & MGMT: IMG5224

## 2019-06-25 NOTE — Progress Notes (Signed)
Referring Physician(s): Gross,Steven  Chief Complaint: The patient is seen in follow up today s/p drainage of a periappendiceal abscess on 05/30/2019 followed by exchange/new drain placement on 06/14/2019  History of present illness: Ashley Dunn is a 86 year old female with history of appendicitis and periappendiceal abscess who underwent drain placement on 05/30/2019 followed by subsequent upsizing/exchange and repositioning of the periappendiceal drain on 06/14/2019.  She presents today accompanied by her daughter  for follow-up CT and drain evaluation.  According to the patient she has done well since her last drain exchange.  She currently denies fever, headache, chest pain, dyspnea, cough, worsening abdominal/back pain, nausea, vomiting .  She does notice some occasional hemorrhoidal bleeding.  She was recently diagnosed with C diff and is currently on oral vancomycin.  She denies diarrhea at this time.  Output from her right lower quadrant drain has been minimal amount of light brown fluid.  She is flushing her drain once daily with 2 to 5 cc sterile saline.   Past Medical History:  Diagnosis Date   Arthritis    Atrial fibrillation ([place])    Cataract    CHF (congestive heart failure) ([place])    Hypertension    Osteoporosis    Thyroid disease     Past Surgical History:  Procedure Laterality Date   CESAREAN SECTION     EYE SURGERY     FRACTURE SURGERY     IR CATHETER TUBE CHANGE  06/12/2019   IR RADIOLOGIST EVAL & MGMT  06/11/2019    Allergies: Patient has no known allergies.  Medications: Prior to Admission medications   Medication Sig Start Date End Date Taking? Authorizing Provider  diltiazem (CARDIZEM CD) 240 MG 24 hr capsule Take 240 mg by mouth 2 (two) times daily.  12/28/18   [provider]  metoprolol succinate (TOPROL-XL) 25 MG 24 hr tablet Take 1 tablet (25 mg total) by mouth daily. Patient taking differently: Take 25 mg by mouth daily. Take with 50mg   for a total of 75mg  05/14/19   Miquel Dunn, NP  metoprolol succinate (TOPROL-XL) 50 MG 24 hr tablet Take 50 mg by mouth daily. Take with 25mg  for a total of 75mg  01/21/19   [provider]  thyroid (ARMOUR THYROID) 120 MG tablet Take 1 tablet (120 mg total) by mouth daily before breakfast. 05/14/19   Forrest Moron, MD  thyroid [facility] THYROID) 60 MG tablet Take 60mg  by mouth daily and add to the 120mg  05/29/19   Forrest Moron, MD  torsemide (DEMADEX) 20 MG tablet Take 20 mg by mouth 2 (two) times daily.  01/15/19   [provider]  vancomycin (VANCOCIN) 125 MG capsule Take 1 capsule (125 mg total) by mouth 4 (four) times daily for 10 days. 06/21/19 07/01/19  Forrest Moron, MD  warfarin (COUMADIN) 5 MG tablet Take 2.5-5 mg by mouth See admin instructions. Take 2.5mg  Mon, Wed, Fri and Sun after supper. Take 5 MG Tuesday, Thursday and Saturday after supper. Or as directed by cardiologist Take 5 mg by mouth 12/28/18   [provider]     Family History  Problem Relation Age of Onset   Stroke Mother    Cancer Father    Hyperlipidemia Brother    Healthy Daughter    Healthy Son    Heart disease Maternal Grandmother    Heart disease Maternal Grandfather     Social History   Socioeconomic History   Marital status: Widowed    Spouse name: Not  on file   Number of children: Not on file   Years of education: Not on file   Highest education level: Not on file  Occupational History   Not on file  Tobacco Use   Smoking status: Never Smoker   Smokeless tobacco: Never Used  Substance and Sexual Activity   Alcohol use: Never   Drug use: Never   Sexual activity: Not Currently  Other Topics Concern   Not on file  Social History Narrative   Lives at home with duaghter   Social Determinants of Health   Financial Resource Strain:    Difficulty of Paying Living Expenses: Not on file  Food Insecurity:    Worried About Charity fundraiser in  the Last Year: Not on file   [employer] of Food in the Last Year: Not on file  Transportation Needs:    Lack of Transportation (Medical): Not on file   Lack of Transportation (Non-Medical): Not on file  Physical Activity:    Days of Exercise per Week: Not on file   Minutes of Exercise per Session: Not on file  Stress:    Feeling of Stress : Not on file  Social Connections:    Frequency of Communication with Friends and Family: Not on file   Frequency of Social Gatherings with Friends and Family: Not on file   Attends Religious Services: Not on file   Active Member of Clubs or Organizations: Not on file   Attends Archivist Meetings: Not on file   Marital Status: Not on file     Vital Signs: Blood pressure 125/85, heart rate 75, temperature 98, oxygen saturation 96% room air.  Physical Exam awake, alert.  Right lower quadrant drain intact, insertion site okay, minimal tenderness to palpation, minimal amount of light brown fluid in drain bag  Imaging: No results found.  Labs:  CBC: Recent Labs    05/31/19 0315 05/31/19 0315 06/01/19 0327 06/02/19 0358 06/17/19 1050 06/17/19 1117  WBC 12.1*   < > 9.5 9.6 9.2 8.7  HGB 12.2   < > 12.2 13.6 15.2* 15.7  HCT 36.9   < > 37.4 42.0 44.0* 47.2*  PLT 248  --  259 313  --  272   < > = values in this interval not displayed.    COAGS: Recent Labs    05/29/19 1715 05/30/19 0247 05/31/19 0315 06/01/19 0327 06/02/19 0358 06/17/19 1524  INR 1.5*   < > 1.7* 2.0* 1.8* 2.3  APTT 32  --   --   --   --   --    < > = values in this interval not displayed.    BMP: Recent Labs    05/31/19 0315 06/01/19 0327 06/02/19 0358 06/17/19 1112  NA 139 141 141 142  K 3.1* 3.8 3.3* 3.4*  CL 94* 100 96* 94*  CO2 32 33* 34* 27  GLUCOSE 101* 114* 96 110*  BUN 16 22 19 29   CALCIUM 8.3* 8.4* 9.1 9.0  CREATININE 0.97 1.19* 1.11* 1.51*  GFRNONAA 51* 40* 43* 30*  GFRAA 59* 46* 50* 35*    LIVER FUNCTION  TESTS: Recent Labs    02/06/19 1039 05/29/19 1223  BILITOT 0.6 1.0  AST 32 23  ALT 19 18  ALKPHOS 92 74  PROT 7.2 7.3  ALBUMIN 4.1 2.6*    Assessment: 86 year old female with history of appendicitis and periappendiceal abscess who underwent drain placement on 05/30/2019 followed by subsequent upsizing/exchange and repositioning of  the periappendiceal drain on 06/14/2019.  She presents today accompanied by her daughter  for follow-up CT and drain evaluation.  According to the patient she has done well since her last drain exchange.  She currently denies fever, headache, chest pain, dyspnea, cough, worsening abdominal/back pain, nausea, vomiting .  She does notice some occasional hemorrhoidal bleeding.  She was recently diagnosed with C diff and is currently on oral vancomycin.  She denies diarrhea at this time.  Output from her right lower quadrant drain has been minimal amount of light brown fluid.  Follow-up CT abdomen pelvis today revealed: 1. Interval repositioning of right lower quadrant pigtail drain catheter with no new or undrained component. 2. Persistent phlegmonous process in the right lower quadrant, relatively improved since previous study. 3. Stable T12 and L3 compression deformities  Right lower quadrant drain injection today reveals no discrete fistula to bowel or appendix.  Latest imaging studies were reviewed by Dr. Vernard Gambles.  Recommend that patient continue with current regimen of once daily flushing of drain with 5 cc sterile normal saline, output recording and dressing changes as needed.  She was given prescription for additional saline flushes.  She should follow up with Dr. Johney Maine to determine next care plan before considering drain removal.  Signed: D. Rowe Robert, PA-C 06/25/2019, 12:39 PM   Please refer to Dr. Adron Bene attestation of this note for management and plan.      Patient ID: Ashley Dunn, female   DOB: 11/28/1925, 86 y.o.   MRN: [id number]

## 2019-06-25 NOTE — Telephone Encounter (Signed)
Spoke to pt's daughter [name] and she stated that her mom is doing really well since she has been on the medication. I advised her to keep [place] informed if anything changes.

## 2019-06-28 ENCOUNTER — Ambulatory Visit: Payer: Medicare Other

## 2019-06-28 ENCOUNTER — Other Ambulatory Visit: Payer: Self-pay

## 2019-06-28 DIAGNOSIS — I4821 Permanent atrial fibrillation: Secondary | ICD-10-CM

## 2019-07-01 ENCOUNTER — Encounter: Payer: Self-pay | Admitting: Radiology

## 2019-07-11 ENCOUNTER — Other Ambulatory Visit: Payer: Self-pay | Admitting: Student

## 2019-07-11 DIAGNOSIS — K3533 Acute appendicitis with perforation and localized peritonitis, with abscess: Secondary | ICD-10-CM

## 2019-07-12 ENCOUNTER — Ambulatory Visit (HOSPITAL_COMMUNITY)
Admission: RE | Admit: 2019-07-12 | Discharge: 2019-07-12 | Disposition: A | Discharge status: Home / Self Care | Payer: Medicare Other | Source: Ambulatory Visit | Attending: Student | Admitting: Student

## 2019-07-12 ENCOUNTER — Other Ambulatory Visit (HOSPITAL_COMMUNITY): Payer: Self-pay | Admitting: Surgery

## 2019-07-12 ENCOUNTER — Other Ambulatory Visit: Payer: Self-pay

## 2019-07-12 ENCOUNTER — Telehealth: Payer: Self-pay | Admitting: Family Medicine

## 2019-07-12 ENCOUNTER — Telehealth: Payer: Self-pay

## 2019-07-12 DIAGNOSIS — K3533 Acute appendicitis with perforation and localized peritonitis, with abscess: Secondary | ICD-10-CM

## 2019-07-12 DIAGNOSIS — K37 Unspecified appendicitis: Secondary | ICD-10-CM

## 2019-07-12 DIAGNOSIS — L0291 Cutaneous abscess, unspecified: Secondary | ICD-10-CM

## 2019-07-12 MED ORDER — IOHEXOL 300 MG/ML  SOLN
80.0000 mL | Freq: Once | INTRAMUSCULAR | Status: AC | PRN
  Administered 2019-07-12: 80 mL via INTRAVENOUS

## 2019-07-12 NOTE — Telephone Encounter (Signed)
error 

## 2019-07-12 NOTE — Telephone Encounter (Signed)
Pt's daughter came in requesting cdiff testing believing that the symptoms are starting to return. There is a positive cdiff from last month. Specimen will be brought in on Monday

## 2019-07-12 NOTE — Telephone Encounter (Signed)
Pt daughter called requesting  c diff test supplies talked to [facility] and she said pt daughter can come pick it up today before 5 07/12/19. Pt is on there way. Please advise.

## 2019-07-12 NOTE — Addendum Note (Signed)
Addended by: Amalia Hailey on: 07/12/2019 04:56 PM   Modules accepted: Orders

## 2019-07-15 ENCOUNTER — Ambulatory Visit: Payer: Medicare Other | Admitting: Cardiology

## 2019-07-15 ENCOUNTER — Other Ambulatory Visit: Payer: Self-pay | Admitting: Pharmacist

## 2019-07-15 ENCOUNTER — Other Ambulatory Visit: Payer: Self-pay

## 2019-07-15 DIAGNOSIS — I480 Paroxysmal atrial fibrillation: Secondary | ICD-10-CM

## 2019-07-15 DIAGNOSIS — I208 Other forms of angina pectoris: Secondary | ICD-10-CM

## 2019-07-15 DIAGNOSIS — I482 Chronic atrial fibrillation, unspecified: Secondary | ICD-10-CM

## 2019-07-15 LAB — POCT INR: INR: 1.5 — AB (ref 2.0–3.0)

## 2019-07-15 NOTE — Addendum Note (Signed)
Addended by: Meredeth Ide on: 07/15/2019 10:14 AM   Modules accepted: Orders

## 2019-07-15 NOTE — Progress Notes (Signed)
Anticoagulation Summary  As of 07/15/2019   INR goal:  2.0-3.0  TTR:  51.8 % (2.5 mo)  INR used for dosing:  1.5 (07/15/2019)  Warfarin maintenance plan:  5 mg (5 mg x 1) every Tue, Thu, Sat; 2.5 mg (2.5 mg x 1) all other days  Weekly warfarin total:  25 mg  Plan last modified:  Mares, Lesley (04/09/2019)  Next INR check:  07/18/2019  Target end date:  Indefinite   Indications   Atrial fibrillation (H[facility]) [I48.91]        Anticoagulation Episode Summary    INR check location:     Preferred lab:     Send INR reminders to:     Comments:        PATIENT FINDING: INR subtheraputic. Pt is scheduled for a GI procedure on 07/17/19. Pt was instructed to hold doses for 5 days prior. Last dose of warfarin on 07/12/19. Pt was also started on oral vancomycin for C.diff infection for 10 day course starting on 06/21/19. Pt had recurrent infection at the drainage site and was restarted on oral Augmentin for a 7 day course starting on 3/18, with the expected end date of 3/24.    PLAN/ASSESSMENT: Subtherapeutic INR readings likely due to held doses. Will boost dose on 07/17/19 if okay with provider. Pt scheduled for OV on 07/18/19. Will recheck INR while in OV.

## 2019-07-15 NOTE — Telephone Encounter (Signed)
Pt requested assistance with refilling her metoprolol medication from the mail order pharmacy. Binnie Kand, NP, last prescribed metoprolol 25 mg on 05/14/19 with 3 refills. Pt also has a metoprolol 50 mg prescribed by another provider. Called the mail order pharmacy. Mail order pharmacy stated that they were not able to get a response from the provider on file for the metoprolol 50 mg. Requested the refill authorization request to be sent to our providers.

## 2019-07-16 ENCOUNTER — Other Ambulatory Visit: Payer: Self-pay | Admitting: Radiology

## 2019-07-16 LAB — CLOSTRIDIUM DIFFICILE BY PCR: Toxigenic C. Difficile by PCR: NEGATIVE

## 2019-07-17 ENCOUNTER — Other Ambulatory Visit: Payer: Self-pay

## 2019-07-17 ENCOUNTER — Other Ambulatory Visit (HOSPITAL_COMMUNITY): Payer: Self-pay | Admitting: Surgery

## 2019-07-17 ENCOUNTER — Ambulatory Visit (HOSPITAL_COMMUNITY)
Admission: RE | Admit: 2019-07-17 | Discharge: 2019-07-17 | Disposition: A | Discharge status: Home / Self Care | Payer: Medicare Other | Source: Ambulatory Visit | Attending: Surgery | Admitting: Surgery

## 2019-07-17 DIAGNOSIS — K37 Unspecified appendicitis: Secondary | ICD-10-CM

## 2019-07-17 DIAGNOSIS — L0291 Cutaneous abscess, unspecified: Secondary | ICD-10-CM

## 2019-07-17 DIAGNOSIS — Z79899 Other long term (current) drug therapy: Secondary | ICD-10-CM | POA: Diagnosis not present

## 2019-07-17 DIAGNOSIS — Z7901 Long term (current) use of anticoagulants: Secondary | ICD-10-CM | POA: Diagnosis not present

## 2019-07-17 DIAGNOSIS — K3533 Acute appendicitis with perforation and localized peritonitis, with abscess: Secondary | ICD-10-CM | POA: Diagnosis not present

## 2019-07-17 DIAGNOSIS — Z79890 Hormone replacement therapy (postmenopausal): Secondary | ICD-10-CM | POA: Diagnosis not present

## 2019-07-17 DIAGNOSIS — Z823 Family history of stroke: Secondary | ICD-10-CM | POA: Insufficient documentation

## 2019-07-17 DIAGNOSIS — I4891 Unspecified atrial fibrillation: Secondary | ICD-10-CM | POA: Insufficient documentation

## 2019-07-17 DIAGNOSIS — I509 Heart failure, unspecified: Secondary | ICD-10-CM | POA: Diagnosis not present

## 2019-07-17 DIAGNOSIS — I110 Hypertensive heart disease with heart failure: Secondary | ICD-10-CM | POA: Insufficient documentation

## 2019-07-17 DIAGNOSIS — E079 Disorder of thyroid, unspecified: Secondary | ICD-10-CM | POA: Diagnosis not present

## 2019-07-17 DIAGNOSIS — Z8249 Family history of ischemic heart disease and other diseases of the circulatory system: Secondary | ICD-10-CM | POA: Insufficient documentation

## 2019-07-17 DIAGNOSIS — Z8349 Family history of other endocrine, nutritional and metabolic diseases: Secondary | ICD-10-CM | POA: Diagnosis not present

## 2019-07-17 LAB — CBC
HCT: 45.9 % (ref 36.0–46.0)
Hemoglobin: 14.9 g/dL (ref 12.0–15.0)
MCH: 30.3 pg (ref 26.0–34.0)
MCHC: 32.5 g/dL (ref 30.0–36.0)
MCV: 93.5 fL (ref 80.0–100.0)
Platelets: 354 10*3/uL (ref 150–400)
RBC: 4.91 MIL/uL (ref 3.87–5.11)
RDW: 15.8 % — ABNORMAL HIGH (ref 11.5–15.5)
WBC: 10.6 10*3/uL — ABNORMAL HIGH (ref 4.0–10.5)
nRBC: 0 % (ref 0.0–0.2)

## 2019-07-17 MED ORDER — MIDAZOLAM HCL 2 MG/2ML IJ SOLN
INTRAMUSCULAR | Status: AC | PRN
  Administered 2019-07-17 (×2): 0.5 mg via INTRAVENOUS

## 2019-07-17 MED ORDER — FENTANYL CITRATE (PF) 100 MCG/2ML IJ SOLN
INTRAMUSCULAR | Status: AC
  Filled 2019-07-17: qty 2

## 2019-07-17 MED ORDER — FENTANYL CITRATE (PF) 100 MCG/2ML IJ SOLN
INTRAMUSCULAR | Status: AC | PRN
  Administered 2019-07-17 (×2): 25 ug via INTRAVENOUS

## 2019-07-17 MED ORDER — MIDAZOLAM HCL 2 MG/2ML IJ SOLN
INTRAMUSCULAR | Status: AC
  Filled 2019-07-17: qty 2

## 2019-07-17 MED ORDER — LIDOCAINE HCL 1 % IJ SOLN
INTRAMUSCULAR | Status: AC
  Filled 2019-07-17: qty 20

## 2019-07-17 MED ORDER — SODIUM CHLORIDE 0.9 % IV SOLN: INTRAVENOUS | Status: DC

## 2019-07-17 MED ORDER — SODIUM CHLORIDE 0.9% FLUSH: 5.0000 mL | Freq: Three times a day (TID) | INTRAVENOUS | Status: DC

## 2019-07-17 NOTE — Progress Notes (Signed)
Discharge instructions reviewed with patient and daughter. Verbalized understanding.

## 2019-07-17 NOTE — H&P (Signed)
Chief Complaint: Patient was seen in consultation today for intra-abdominal fluid collection  Referring Physician(s): Gross,Steven  Supervising Physician: Arne Cleveland  Patient Status: [facility] - Out-pt  History of Present Illness: Ashley Dunn is a 86 y.o. female with past medical history of arthritis, CHF, HTN, a fib on coumadin who presented to [facility] ED 05/30/19 with perforated appendicitis with abscess.  She underwent RLQ drain placement and was discharged home on antibiotics with drain in place. After discharge she was seen in IR drain clinic 06/25/19 at which time there was a small remaining fluid collection with no fistulous connection.  The drain was left in place.  She presented to her surgeon's office mid March for routine follow-up and the drain was pulled at that time.   Shortly after her drain was pulled, she noticed palpable area of hardening under the skin which ruptured with significant drainage. She does not remember if this was before or after the most recent CT scan on 3/19.  Her daughter was given supplies and instructions to pack the drain site at home, however this was not done due to discomfort.  She reports since this time, her abdomen has remained soft and non-tender.  She has been eating and drinking well.  She was placed back on PO antibiotics and her last dose is today.    CT Abdomen Pelvis 3/19: Pigtail drainage catheter noted on prior exam has been removed. Recurrent abscess is noted in the right lower quadrant of the abdomen measuring 5.4 x 3.7 cm. Inflammation is also seen in the subcutaneous tissues of the right lower quadrant with possible developing abscess measuring 2.9 x 2.7 cm. The appendix is significantly enlarged compared to prior exam consistent with inflammation. These results will be called to the ordering clinician or representative by the Radiologist Assistant, and communication documented in the PACS or zVision Dashboard.  She presents today for  possible drain replacement based on her CT findings of a recurrent fluid collection.  She has been NPO.  She has held her coumadin.   Past Medical History:  Diagnosis Date  . Arthritis   . Atrial fibrillation ([place])   . Cataract   . CHF (congestive heart failure) ([place])   . Hypertension   . Osteoporosis   . Thyroid disease     Past Surgical History:  Procedure Laterality Date  . CESAREAN SECTION    . EYE SURGERY    . FRACTURE SURGERY    . IR CATHETER TUBE CHANGE  06/12/2019  . IR RADIOLOGIST EVAL & MGMT  06/11/2019  . IR RADIOLOGIST EVAL & MGMT  06/25/2019    Allergies: Patient has no known allergies.  Medications: Prior to Admission medications   Medication Sig Start Date End Date Taking? Authorizing Provider  amoxicillin-clavulanate (AUGMENTIN) 875-125 MG tablet Take 1 tablet by mouth 2 (two) times daily. 07/11/19  Yes [provider]  diltiazem (CARDIZEM CD) 240 MG 24 hr capsule Take 240 mg by mouth 2 (two) times daily.  12/28/18  Yes [provider]  metoprolol succinate (TOPROL-XL) 50 MG 24 hr tablet Take 50 mg by mouth in the morning and at bedtime.  01/21/19  Yes [provider]  thyroid (ARMOUR) 180 MG tablet Take 180 mg by mouth daily.   Yes [provider]  torsemide (DEMADEX) 20 MG tablet Take 20 mg by mouth 2 (two) times daily.  01/15/19  Yes [provider]  metoprolol succinate (TOPROL-XL) 25 MG 24 hr tablet Take 1 tablet (25 mg total)  by mouth daily. Patient not taking: Reported on 07/16/2019 05/14/19   Miquel Dunn, NP  thyroid [facility] THYROID) 120 MG tablet Take 1 tablet (120 mg total) by mouth daily before breakfast. Patient not taking: Reported on 07/16/2019 05/14/19   Forrest Moron, MD  thyroid [facility] THYROID) 60 MG tablet Take 60mg  by mouth daily and add to the 120mg  Patient not taking: Reported on 07/16/2019 05/29/19   Forrest Moron, MD  warfarin (COUMADIN) 5 MG tablet Take 2.5-5 mg by mouth See admin  instructions. Take 2.5 mg by mouth on Mon, Wed, Fri and Sun after supper. Take 5 MG Tuesday, Thursday and Saturday after supper. 12/28/18   [provider]     Family History  Problem Relation Age of Onset  . Stroke Mother   . Cancer Father   . Hyperlipidemia Brother   . Healthy Daughter   . Healthy Son   . Heart disease Maternal Grandmother   . Heart disease Maternal Grandfather     Social History   Socioeconomic History  . Marital status: Widowed    Spouse name: Not on file  . Number of children: Not on file  . Years of education: Not on file  . Highest education level: Not on file  Occupational History  . Not on file  Tobacco Use  . Smoking status: Never Smoker  . Smokeless tobacco: Never Used  Substance and Sexual Activity  . Alcohol use: Never  . Drug use: Never  . Sexual activity: Not Currently  Other Topics Concern  . Not on file  Social History Narrative   Lives at home with duaghter   Social Determinants of Health   Financial Resource Strain:   . Difficulty of Paying Living Expenses:   Food Insecurity:   . Worried About Charity fundraiser in the Last Year:   . Arboriculturist in the Last Year:   Transportation Needs:   . Film/video editor (Medical):   Marland Kitchen Lack of Transportation (Non-Medical):   Physical Activity:   . Days of Exercise per Week:   . Minutes of Exercise per Session:   Stress:   . Feeling of Stress :   Social Connections:   . Frequency of Communication with Friends and Family:   . Frequency of Social Gatherings with Friends and Family:   . Attends Religious Services:   . Active Member of Clubs or Organizations:   . Attends Archivist Meetings:   Marland Kitchen Marital Status:      Review of Systems: A 12 point ROS discussed and pertinent positives are indicated in the HPI above.  All other systems are negative.  Review of Systems  Constitutional: Negative for fatigue and fever.  Respiratory: Negative for cough and  shortness of breath.   Cardiovascular: Negative for chest pain.  Gastrointestinal: Negative for abdominal distention, abdominal pain, diarrhea, nausea and vomiting.  Genitourinary: Negative for dysuria.  Musculoskeletal: Negative for back pain.  Psychiatric/Behavioral: Negative for behavioral problems and confusion.    Vital Signs: BP 140/83   Pulse (!) 113   Temp 97.7 F (36.5 C) (Skin)   Resp 16   Ht 5' (1.524 m)   Wt 155 lb (70.3 kg)   SpO2 94%   BMI 30.27 kg/m   Physical Exam Vitals and nursing note reviewed.  Constitutional:      General: She is not in acute distress.    Appearance: Normal appearance. She is not ill-appearing.  HENT:  Mouth/Throat:     Mouth: Mucous membranes are moist.     Pharynx: Oropharynx is clear.  Cardiovascular:     Rate and Rhythm: Normal rate. Rhythm irregular.  Pulmonary:     Effort: Pulmonary effort is normal. No respiratory distress.     Breath sounds: Normal breath sounds.  Abdominal:     General: There is no distension.     Palpations: Abdomen is soft.     Tenderness: There is no abdominal tenderness.     Comments: Prior drain site soft, well-healed.  No drainage, erythema, pus.  Skin:    General: Skin is warm and dry.  Neurological:     General: No focal deficit present.     Mental Status: She is alert and oriented to person, place, and time.  Psychiatric:        Mood and Affect: Mood normal.        Behavior: Behavior normal.        Thought Content: Thought content normal.        Judgment: Judgment normal.      MD Evaluation Airway: WNL Heart: WNL Abdomen: WNL Chest/ Lungs: WNL ASA  Classification: 3 Mallampati/Airway Score: One   Imaging: CT ABDOMEN PELVIS WO CONTRAST  Result Date: 06/25/2019 CLINICAL DATA:  Periappendiceal abscess, post percutaneous drainage. Elevated creatinine EXAM: CT ABDOMEN AND PELVIS WITHOUT CONTRAST TECHNIQUE: Multidetector CT imaging of the abdomen and pelvis was performed following the  standard protocol without IV contrast. COMPARISON:  06/11/2019 FINDINGS: Lower chest: 2.8 cm pleural-based masslike consolidation in the posterior right lower lobe as before. No pleural or pericardial effusion. Coronary calcifications. Hepatobiliary: No focal liver abnormality is seen. No gallstones, gallbladder wall thickening, or biliary dilatation. Pancreas: Unremarkable. No pancreatic ductal dilatation or surrounding inflammatory changes. Spleen: Normal in size without focal abnormality. Adrenals/Urinary Tract: Mild left adrenal hyperplasia as before. Stable bilateral renal cysts. No hydronephrosis. No urolithiasis. Urinary bladder nondistended. Stomach/Bowel: Stomach is nondistended. Small bowel decompressed. Appendix is distended up to 1.8 cm diameter. Contiguous phlegmonous process, relatively improved since previous study, post repositioning of pigtail drain catheter more centrally. No new or undrained component. Vascular/Lymphatic: Aortoiliac atherosclerosis (ICD10-170.0) without aneurysm. No abdominal or pelvic adenopathy. Reproductive: Uterus and bilateral adnexa are unremarkable. Other: No ascites. No free air. Musculoskeletal: Small umbilical hernia containing only mesenteric fat. T12 and L3 compression deformities, stable. Multilevel lumbar spondylitic change. Thoracolumbar scoliosis. No acute fracture or worrisome bone lesion. IMPRESSION: 1. Interval repositioning of right lower quadrant pigtail drain catheter with no new or undrained component. 2. Persistent phlegmonous process in the right lower quadrant, relatively improved since previous study. 3. Stable T12 and L3 compression deformities. Aortic Atherosclerosis (ICD10-I70.0). Electronically Signed   By: Lucrezia Europe M.D.   On: 06/25/2019 12:47   CT ABDOMEN PELVIS W CONTRAST  Result Date: 07/12/2019 CLINICAL DATA:  Appendicitis. EXAM: CT ABDOMEN AND PELVIS WITH CONTRAST TECHNIQUE: Multidetector CT imaging of the abdomen and pelvis was performed  using the standard protocol following bolus administration of intravenous contrast. CONTRAST:  19mL OMNIPAQUE IOHEXOL 300 MG/ML  SOLN COMPARISON:  June 25, 2019. FINDINGS: Lower chest: Stable pleural based consolidation is seen in right lower lobe. Otherwise visualized lung bases are unremarkable. Hepatobiliary: No focal liver abnormality is seen. No gallstones, gallbladder wall thickening, or biliary dilatation. Pancreas: Unremarkable. No pancreatic ductal dilatation or surrounding inflammatory changes. Spleen: Normal in size without focal abnormality. Adrenals/Urinary Tract: Adrenal glands appear normal. Bilateral renal cysts are noted. No hydronephrosis or renal obstruction is noted. No  renal or ureteral calculi are noted. Urinary bladder is unremarkable. Stomach/Bowel: The stomach appears normal. There is no evidence of bowel obstruction or dilatation. Pigtail drainage catheter noted on prior exam has been removed. Recurrent abscess measuring 5.4 x 3.7 cm is noted in the right lower quadrant of the abdomen. Inflammation is also seen in the subcutaneous tissues of the right lower quadrant with possible developing abscess measuring 2.9 x 2.7 cm. The appendix is significantly enlarged compared to prior exam consistent with inflammation. Vascular/Lymphatic: Aortic atherosclerosis. No enlarged abdominal or pelvic lymph nodes. Reproductive: Uterus and bilateral adnexa are unremarkable. Other: Small fat containing periumbilical hernia is noted. No ascites is noted. Musculoskeletal: Multilevel degenerative disc disease is noted in the lower thoracic and lumbar spine. No acute abnormality is noted. IMPRESSION: Pigtail drainage catheter noted on prior exam has been removed. Recurrent abscess is noted in the right lower quadrant of the abdomen measuring 5.4 x 3.7 cm. Inflammation is also seen in the subcutaneous tissues of the right lower quadrant with possible developing abscess measuring 2.9 x 2.7 cm. The appendix is  significantly enlarged compared to prior exam consistent with inflammation. These results will be called to the ordering clinician or representative by the Radiologist Assistant, and communication documented in the PACS or zVision Dashboard. Aortic Atherosclerosis (ICD10-I70.0). Electronically Signed   By: Marijo Conception M.D.   On: 07/12/2019 12:45   DG Sinus/Fist Tube Chk-Non GI  Result Date: 06/25/2019 CLINICAL DATA:  Appendicitis with abscess, post drain catheter placement and revision EXAM: ABSCESS DRAIN CATHETER INJECTION UNDER FLUOROSCOPY COMPARISON:  06/11/2019 TECHNIQUE: The procedure, risks (including but not limited to bleeding, infection, organ damage), benefits, and alternatives were explained to the patient. Questions regarding the procedure were encouraged and answered. The patient understands and consents to the procedure. Survey fluoroscopic inspection reveals stable position of right lower quadrant/pelvic pigtail drain catheter. Injection demonstrates small residual abscess cavity, significantly improved since prior exam. No further fistula to bowel. With continued injection, contrast leaks back along the catheter shaft and out the skin entry site. IMPRESSION: 1. Continued healing of abscess cavity with only small residual. No further fistula to bowel. Electronically Signed   By: Lucrezia Europe M.D.   On: 06/25/2019 14:12   PCV ECHOCARDIOGRAM COMPLETE  Result Date: 06/30/2019 Echocardiogram 06/28/2019: 1. Mildly depressed LV systolic function with visual EF 45-50%. Left ventricle cavity is normal in size. Mild left ventricular hypertrophy. Mildly reduced global wall motion. Elevated LAP. Unable to evaluate diastolic function due to underlying rhythm. Calculated EF 46%. 2. Left atrial cavity is severely dilated. 3. Visually, right atrial cavity is mild to moderately dilated. 4. Mild (Grade I) mitral regurgitation. 5. Moderate tricuspid regurgitation. Mild pulmonary hypertension. RVSP measures 44  mmHg. 6. No prior study for comparison.  IR Radiologist Eval & Mgmt  Result Date: 06/25/2019 Please refer to notes tab for details about interventional procedure. (Op Note)   Labs:  CBC: Recent Labs    06/01/19 0327 06/01/19 0327 06/02/19 0358 06/17/19 1050 06/17/19 1117 07/17/19 0917  WBC 9.5   < > 9.6 9.2 8.7 10.6*  HGB 12.2   < > 13.6 15.2* 15.7 14.9  HCT 37.4   < > 42.0 44.0* 47.2* 45.9  PLT 259  --  313  --  272 354   < > = values in this interval not displayed.    COAGS: Recent Labs    05/29/19 1715 05/30/19 0247 06/01/19 0327 06/02/19 0358 06/17/19 1524 07/15/19 1501  INR 1.5*   < >  2.0* 1.8* 2.3 1.5*  APTT 32  --   --   --   --   --    < > = values in this interval not displayed.    BMP: Recent Labs    05/31/19 0315 06/01/19 0327 06/02/19 0358 06/17/19 1112  NA 139 141 141 142  K 3.1* 3.8 3.3* 3.4*  CL 94* 100 96* 94*  CO2 32 33* 34* 27  GLUCOSE 101* 114* 96 110*  BUN 16 22 19 29   CALCIUM 8.3* 8.4* 9.1 9.0  CREATININE 0.97 1.19* 1.11* 1.51*  GFRNONAA 51* 40* 43* 30*  GFRAA 59* 46* 50* 35*    LIVER FUNCTION TESTS: Recent Labs    02/06/19 1039 05/29/19 1223  BILITOT 0.6 1.0  AST 32 23  ALT 19 18  ALKPHOS 92 74  PROT 7.2 7.3  ALBUMIN 4.1 2.6*    TUMOR MARKERS: No results for input(s): AFPTM, CEA, CA199, CHROMGRNA in the last 8760 hours.  Assessment and Plan: Recurrent intra-abdominal fluid collection  History of appendicitis Patient with past medical history of appendicitis with perforation presents with complaint of intra-abdominal fluid collection recurrence after recent drain pull.  IR consulted for aspiration and drainage at the request of Dr. Johney Maine. Case reviewed by Dr. Vernard Gambles who approves patient for procedure.  Patient presents today in their usual state of health.  She has been NPO and is not currently on blood thinners as she has held her coumadin x6 days.  INR 3/22 1.5  Risks and benefits discussed with the patient  including bleeding, infection, damage to adjacent structures, bowel perforation/fistula connection, and sepsis.  All of the patient's questions were answered, patient is agreeable to proceed. Consent signed and in chart.  Thank you for this interesting consult.  I greatly enjoyed meeting Bailea Beed and look forward to participating in their care.  A copy of this report was sent to the requesting provider on this date.  Electronically Signed: Docia Barrier, PA 07/17/2019, 9:54 AM   I spent a total of  30 Minutes   in face to face in clinical consultation, greater than 50% of which was counseling/coordinating care for intra-abdominal fluid collection.

## 2019-07-17 NOTE — Procedures (Signed)
  Procedure: CT drain placement RLQ collection 22ml bloody purulent for GS, C&S EBL:   minimal Complications:  none immediate  See full dictation in [facility].  Dillard Cannon MD Main # [phone] Pager  [phone]

## 2019-07-17 NOTE — Progress Notes (Signed)
Per Dr Vernard Gambles, this exam needs to be done in CT (originally scheduled in IR). CT is not able to accommodate procedure at this time because they have a scanner down for maintenance. CT Tech states that it will be at least 1130 before they can let us come over for procedure and maybe later. They will call us when they are ready. Informed MD and pt. Pt verbalized understanding. She is resting with no complaints at this time.

## 2019-07-18 ENCOUNTER — Encounter: Payer: Medicare Other | Admitting: Cardiology

## 2019-07-18 ENCOUNTER — Other Ambulatory Visit: Payer: Self-pay | Admitting: Surgery

## 2019-07-18 DIAGNOSIS — L0291 Cutaneous abscess, unspecified: Secondary | ICD-10-CM

## 2019-07-22 LAB — AEROBIC/ANAEROBIC CULTURE W GRAM STAIN (SURGICAL/DEEP WOUND)

## 2019-07-25 ENCOUNTER — Other Ambulatory Visit: Payer: Self-pay

## 2019-07-25 ENCOUNTER — Ambulatory Visit: Payer: Medicare Other | Admitting: Cardiology

## 2019-07-25 ENCOUNTER — Encounter: Payer: Self-pay | Admitting: Cardiology

## 2019-07-25 VITALS — BP 117/66 | HR 70 | Ht 60.0 in | Wt 152.0 lb

## 2019-07-25 DIAGNOSIS — Z01818 Encounter for other preprocedural examination: Secondary | ICD-10-CM

## 2019-07-25 DIAGNOSIS — I4821 Permanent atrial fibrillation: Secondary | ICD-10-CM

## 2019-07-25 LAB — POCT INR: INR: 1.5 — AB (ref 2.0–3.0)

## 2019-07-25 MED ORDER — METOPROLOL SUCCINATE ER 50 MG PO TB24: 50.0000 mg | ORAL_TABLET | Freq: Two times a day (BID) | ORAL | 2 refills | Status: DC

## 2019-07-25 NOTE — Progress Notes (Signed)
Patient referred by Forrest Moron, MD for atrial fibrillation  Subjective:   Ashley Dunn, female    DOB: [birth year], 86 y.o.   MRN: [id number]   Chief Complaint  Patient presents with  . Atrial Fibrillation  . Follow-up    4 week    HPI  86 y.o. Caucasian female with hyperthyroidism, controlled hypertension, persistent atrial flutter/fibrillation, h/o congestive heart failure.   Patient has had recurrent pelvic abscess, with presumed etiology being appendicitis. It appears that she will likely need appendectomy. She recently underwent echocardiogram, details below. She has fairly good functional capacity for a 86 y/o without any limiting chest pain or shortness of breath.   Patient was admitted to [facility] in February 2021 with pelvic abscess.    Initial consultation HPI 02/2019: Patient is here today with her daughter [name], who lives with her.  Both patient and her doctor moved from [place] to [place] earlier this summer.  Patient is quite active and independent for her age.  She walks around with a walker.  She has had diagnosis of atrial fibrillation since 2014.  She was previously seeing a cardiologist, Dr. Dellia Cloud in [place], [place].  She has been on diltiazem XL to 40 mg 1 capsule twice daily, and metoprolol succinate 25 mg in a.m. and 50 mg in p.m. for quite some time.  Although unusual dosing, she has tolerated this well.  In fact, she tells me that she had tachycardia in 120s then attempt to reduce diltiazem 240 mg to once daily.  She is also been on warfarin with well-controlled INR levels for a long time.  She has not had any previous cardioversion.  She has minimal to no symptoms in terms of palpitations, chest pain, shortness of breath with exertion.  She takes torsemide 20 mg 2 tablets daily, with no significant residual leg edema.  She denies any orthopnea, PND.  Her biggest complaint at this time is regarding her bowel movements.  She  is seeing her PCP for the same.  She has hemorrhoids that bleed "once in a while".  She denies any melena, hematochezia, hematemesis.  She has arthritis, for which she occasionally takes Tylenol and does not use any NSAIDs.  Patient gets her medications through a mail order pharmacy.[facility] Number [phone]. She currently does not need any refills.   Current Outpatient Medications on File Prior to Visit  Medication Sig Dispense Refill  . amoxicillin-clavulanate (AUGMENTIN) 875-125 MG tablet Take 1 tablet by mouth 2 (two) times daily.    Marland Kitchen diltiazem (CARDIZEM CD) 240 MG 24 hr capsule Take 240 mg by mouth 2 (two) times daily.     . metoprolol succinate (TOPROL-XL) 50 MG 24 hr tablet Take 50 mg by mouth in the morning and at bedtime.     Marland Kitchen thyroid (ARMOUR THYROID) 120 MG tablet Take 1 tablet (120 mg total) by mouth daily before breakfast. 90 tablet 1  . thyroid (ARMOUR) 180 MG tablet Take 180 mg by mouth daily.    Marland Kitchen torsemide (DEMADEX) 20 MG tablet Take 20 mg by mouth 2 (two) times daily.     Marland Kitchen warfarin (COUMADIN) 5 MG tablet Take 2.5-5 mg by mouth See admin instructions. Take 2.5 mg by mouth on Mon, Wed, Fri and Sun after supper. Take 5 MG Tuesday, Thursday and Saturday after supper.     No current facility-administered medications on file prior to visit.    Cardiovascular studies:  Echocardiogram 06/28/2019:  1. Mildly depressed LV  systolic function with visual EF 45-50%. Left  ventricle cavity is normal in size. Mild left ventricular hypertrophy.  Mildly reduced global wall motion. Elevated LAP. Unable to evaluate  diastolic function due to underlying rhythm. Calculated EF 46%.  2. Left atrial cavity is severely dilated.  3. Visually, right atrial cavity is mild to moderately dilated.  4. Mild (Grade I) mitral regurgitation.  5. Moderate tricuspid regurgitation. Mild pulmonary hypertension. RVSP  measures 44 mmHg.  6. No prior study for comparison.  EKG  02/27/2019: Atypical atrial flutter with variable AV conduction, ventricular rate 96 bpm. Nonspecific ST depression  -Nondiagnostic.   Recent labs: 06/17/2019: H/H 15/44. MCV 89.   06/02/2019: Glucose 96, BUN/Cr 19/1.1. EGFR 43. Na/K 141/3.3.   05/29/2019: BNP 345  04/29/2019: TSH 0.043, free T4 1.5   Review of Systems  Cardiovascular: Negative for chest pain, dyspnea on exertion, leg swelling, palpitations and syncope.         Vitals:   07/25/19 1055  BP: 117/66  Pulse: 70  SpO2: 96%     Body mass index is 29.69 kg/m. Filed Weights   07/25/19 1055  Weight: 152 lb (68.9 kg)     Objective:   Physical Exam  Constitutional: She appears well-developed and well-nourished.  Neck: No JVD present.  Cardiovascular: Regular rhythm and intact distal pulses.  No murmur heard. Pulmonary/Chest: Effort normal and breath sounds normal. She has no wheezes. She has no rales.  Musculoskeletal:        General: No edema.  Nursing note and vitals reviewed.       Assessment & Recommendations:    86 y.o. Caucasian female with hyperthyroidism, controlled hypertension, persistent atrial flutter/fibrillation, h/o congestive heart failure.    Permanent atrial fibrillation/flutter:  She is on rather high doses of diltiazem XL 240 mg twice daily, metoprolol succinate 50 mg bid without any significant bradycardia symptoms. Given her advanced age, lack of symptoms, I will treat this as permanent atrial flutter/fibrillation without any attempt to restore sinus rhythm.  CHA2DS2VASc score 3, annual stroke risk 3.2%. Goal: 2.0-3.0 Indication: Afib Today's INR: 1.5 (Held for surgery, restarted on 4/24), also affeced by recent antibiotic use. Current dose: 5 mg T, TH, Sa, 2.5 mg all other days  New dose: Same  Next INR check: 10 days  Pre-op risk stratification: Perioperative cardiac risk events elevated by virtue of her advanced age.  She does not have any active angina or dyspnea  symptoms. Low normal EF on echocardiogram. Acceptable cardiac risk to proceed with appendectomy.   Given her low GFR, risks of bleeding would be higher with warfarin/lovenox bridging. Would recommend bo bridging, holding warfarin 5 days before surgery, and resume after surgery, when safely possible.  Above recommendations discussed with patient and daughter in detail.    Nigel Mormon, MD [facility] Pager: [phone] Office: [phone]

## 2019-07-30 ENCOUNTER — Ambulatory Visit
Admission: RE | Admit: 2019-07-30 | Discharge: 2019-07-30 | Disposition: A | Discharge status: Home / Self Care | Payer: Medicare Other | Source: Ambulatory Visit | Attending: Student | Admitting: Student

## 2019-07-30 ENCOUNTER — Ambulatory Visit
Admission: RE | Admit: 2019-07-30 | Discharge: 2019-07-30 | Disposition: A | Discharge status: Home / Self Care | Payer: Medicare Other | Source: Ambulatory Visit | Attending: Surgery | Admitting: Surgery

## 2019-07-30 ENCOUNTER — Other Ambulatory Visit (HOSPITAL_COMMUNITY): Payer: Self-pay | Admitting: Interventional Radiology

## 2019-07-30 ENCOUNTER — Encounter: Payer: Self-pay | Admitting: *Deleted

## 2019-07-30 DIAGNOSIS — L0291 Cutaneous abscess, unspecified: Secondary | ICD-10-CM

## 2019-07-30 DIAGNOSIS — K3533 Acute appendicitis with perforation and localized peritonitis, with abscess: Secondary | ICD-10-CM

## 2019-07-30 HISTORY — PX: IR RADIOLOGIST EVAL & MGMT: IMG5224

## 2019-07-30 NOTE — Progress Notes (Signed)
Referring Physician(s): Dr Clyda Greener  Chief Complaint: The patient is seen in follow up today s/p appendiceal abscess drain placement  History of present illness:  Initial drain placed in IR 05/30/19.  Was followed up in [facility] 3/2 /21. Small collection still remained but no fistula.  Drain stayed in place at that time. Pt was followed up in [place] office "mid March" and drain was removed per MD. Within a few days pt felt hardened place beneath drain site and then leakage from site ensued. Packing and dressings, but continued. On 07/17/19- CT revealed recurrence of abscess and new drain was placed  then in IR.  Now returns for evaluation and CT.  Pt states she is flushing drain once per day with 5 cc sterile saline. OP is same as flush-- for several days now. Has finished antibiotics Denies fever or chills Denies pain  Pt to see Dr Johney Maine 08/07/19  Past Medical History:  Diagnosis Date  . Arthritis   . Atrial fibrillation ([place])   . Cataract   . CHF (congestive heart failure) ([place])   . Hypertension   . Osteoporosis   . Thyroid disease     Past Surgical History:  Procedure Laterality Date  . CESAREAN SECTION    . EYE SURGERY    . FRACTURE SURGERY    . IR CATHETER TUBE CHANGE  06/12/2019  . IR RADIOLOGIST EVAL & MGMT  06/11/2019  . IR RADIOLOGIST EVAL & MGMT  06/25/2019    Allergies: Patient has no known allergies.  Medications: Prior to Admission medications   Medication Sig Start Date End Date Taking? Authorizing Provider  amoxicillin-clavulanate (AUGMENTIN) 875-125 MG tablet Take 1 tablet by mouth 2 (two) times daily. 07/11/19   [provider]  diltiazem (CARDIZEM CD) 240 MG 24 hr capsule Take 240 mg by mouth 2 (two) times daily.  12/28/18   [provider]  metoprolol succinate (TOPROL-XL) 50 MG 24 hr tablet Take 1 tablet (50 mg total) by mouth in the morning and at bedtime. 07/25/19   Patwardhan, Reynold Bowen, MD  thyroid (ARMOUR THYROID) 120 MG tablet Take  1 tablet (120 mg total) by mouth daily before breakfast. 05/14/19   Forrest Moron, MD  thyroid (ARMOUR) 180 MG tablet Take 180 mg by mouth daily.    [provider]  torsemide (DEMADEX) 20 MG tablet Take 20 mg by mouth 2 (two) times daily.  01/15/19   [provider]  warfarin (COUMADIN) 5 MG tablet Take 2.5-5 mg by mouth See admin instructions. Take 2.5 mg by mouth on Mon, Wed, Fri and Sun after supper. Take 5 MG Tuesday, Thursday and Saturday after supper. 12/28/18   [provider]     Family History  Problem Relation Age of Onset  . Stroke Mother   . Cancer Father   . Hyperlipidemia Brother   . Healthy Daughter   . Healthy Son   . Heart disease Maternal Grandmother   . Heart disease Maternal Grandfather     Social History   Socioeconomic History  . Marital status: Widowed    Spouse name: Not on file  . Number of children: Not on file  . Years of education: Not on file  . Highest education level: Not on file  Occupational History  . Not on file  Tobacco Use  . Smoking status: Never Smoker  . Smokeless tobacco: Never Used  Substance and Sexual Activity  . Alcohol use: Never  . Drug use: Never  .  Sexual activity: Not Currently  Other Topics Concern  . Not on file  Social History Narrative   Lives at home with duaghter   Social Determinants of Health   Financial Resource Strain:   . Difficulty of Paying Living Expenses:   Food Insecurity:   . Worried About Charity fundraiser in the Last Year:   . Arboriculturist in the Last Year:   Transportation Needs:   . Film/video editor (Medical):   Marland Kitchen Lack of Transportation (Non-Medical):   Physical Activity:   . Days of Exercise per Week:   . Minutes of Exercise per Session:   Stress:   . Feeling of Stress :   Social Connections:   . Frequency of Communication with Friends and Family:   . Frequency of Social Gatherings with Friends and Family:   . Attends Religious Services:   . Active  Member of Clubs or Organizations:   . Attends Archivist Meetings:   Marland Kitchen Marital Status:      Vital Signs: There were no vitals taken for this visit.   Imaging: No results found.  Labs:  CBC: Recent Labs    06/01/19 0327 06/01/19 0327 06/02/19 0358 06/17/19 1050 06/17/19 1117 07/17/19 0917  WBC 9.5   < > 9.6 9.2 8.7 10.6*  HGB 12.2   < > 13.6 15.2* 15.7 14.9  HCT 37.4   < > 42.0 44.0* 47.2* 45.9  PLT 259  --  313  --  272 354   < > = values in this interval not displayed.    COAGS: Recent Labs    05/29/19 1715 05/30/19 0247 06/02/19 0358 06/17/19 1524 07/15/19 1501 07/25/19 1113  INR 1.5*   < > 1.8* 2.3 1.5* 1.5*  APTT 32  --   --   --   --   --    < > = values in this interval not displayed.    BMP: Recent Labs    05/31/19 0315 06/01/19 0327 06/02/19 0358 06/17/19 1112  NA 139 141 141 142  K 3.1* 3.8 3.3* 3.4*  CL 94* 100 96* 94*  CO2 32 33* 34* 27  GLUCOSE 101* 114* 96 110*  BUN 16 22 19 29   CALCIUM 8.3* 8.4* 9.1 9.0  CREATININE 0.97 1.19* 1.11* 1.51*  GFRNONAA 51* 40* 43* 30*  GFRAA 59* 46* 50* 35*    LIVER FUNCTION TESTS: Recent Labs    02/06/19 1039 05/29/19 1223  BILITOT 0.6 1.0  AST 32 23  ALT 19 18  ALKPHOS 92 74  PROT 7.2 7.3  ALBUMIN 4.1 2.6*    Assessment:  CT has been reviewed with Dr Earleen Newport Although collection seems smaller-- drain is now malpositioned and will need new drain placed. Scheduler is calling IR for appt time and date. Pt prefers this Thursday April 8 if can. Scheduler will call pt when appt is made. Pt and Dtr are agreeable  Signed: Lavonia Drafts, PA-C 07/30/2019, 1:24 PM   Please refer to Dr. Earleen Newport attestation of this note for management and plan.

## 2019-07-31 ENCOUNTER — Telehealth (HOSPITAL_COMMUNITY): Payer: Self-pay

## 2019-07-31 ENCOUNTER — Ambulatory Visit (INDEPENDENT_AMBULATORY_CARE_PROVIDER_SITE_OTHER): Payer: Medicare Other | Admitting: Family Medicine

## 2019-07-31 ENCOUNTER — Other Ambulatory Visit: Payer: Self-pay

## 2019-07-31 DIAGNOSIS — E0580 Other thyrotoxicosis without thyrotoxic crisis or storm: Secondary | ICD-10-CM

## 2019-07-31 NOTE — Telephone Encounter (Signed)
-----   Message from Corrie Mckusick, DO sent at 07/30/2019  3:45 PM EDT ----- Caryl Pina, hello.   I've signed the order for Ms Forstrom.    Not sure if it was for IR exchange/new placement, but just to clarify, she will need CT guided drain placement, with moderate sedation.  She will not need any fluoro studies, and she will need to be NPO for sedation.  Thank you so much JW

## 2019-08-01 ENCOUNTER — Other Ambulatory Visit: Payer: Self-pay | Admitting: Radiology

## 2019-08-01 LAB — TSH: TSH: 0.249 u[IU]/mL — ABNORMAL LOW (ref 0.450–4.500)

## 2019-08-02 ENCOUNTER — Other Ambulatory Visit: Payer: Self-pay

## 2019-08-02 ENCOUNTER — Ambulatory Visit (HOSPITAL_COMMUNITY): Payer: Medicare Other

## 2019-08-02 ENCOUNTER — Other Ambulatory Visit: Payer: Self-pay | Admitting: Student

## 2019-08-02 ENCOUNTER — Telehealth (INDEPENDENT_AMBULATORY_CARE_PROVIDER_SITE_OTHER): Payer: Medicare Other | Admitting: Family Medicine

## 2019-08-02 VITALS — Ht 60.0 in | Wt 150.0 lb

## 2019-08-02 DIAGNOSIS — E0580 Other thyrotoxicosis without thyrotoxic crisis or storm: Secondary | ICD-10-CM

## 2019-08-02 MED ORDER — THYROID 30 MG PO TABS: 30.0000 mg | ORAL_TABLET | Freq: Every day | ORAL | 0 refills | Status: DC

## 2019-08-02 MED ORDER — THYROID 120 MG PO TABS: 120.0000 mg | ORAL_TABLET | Freq: Every day | ORAL | 0 refills | Status: AC

## 2019-08-02 NOTE — Progress Notes (Signed)
Virtual Visit via Video Note  I connected with Ashley Dunn on 08/02/19 at  8:00 AM EDT by a video enabled telemedicine application and verified that I am speaking with the correct person using two identifiers.  Location: Patient: home Provider: Office   I discussed the limitations of evaluation and management by telemedicine and the availability of in person appointments. The patient expressed understanding and agreed to proceed.  History of Present Illness:  Pt had her thyroid levels checked  Her TSH is trending from 0.077 to 0.043 to 0.249 She states that when he dose was changed from Armor Thyroid 180 to 120 she felt very ill.  She currently feels better and her c. Diff diarrhea is improving.  Her loculated appendicitis was drained and is being managed by Gen Surg  No fevers or chills   Observations/Objective: Vitals:   08/02/19 0744  Weight: 150 lb (68 kg)  Height: 5' (1.524 m)   Physical Exam  Constitutional: Oriented to person, place, and time. Appears well-developed and well-nourished.  HENT:  Head: Normocephalic and atraumatic.  Eyes: Conjunctivae and EOM are normal.  Pulmonary/Chest: Effort normal  Neurological: Is alert and oriented to person, place, and time.  Skin: Skin is warm. Capillary refill takes less than 2 seconds.  Psychiatric: Has a normal mood and affect. Behavior is normal. Judgment and thought content normal.    Assessment and Plan:  Problem List Items Addressed This Visit    None    Visit Diagnoses    Iatrogenic hyperthyroidism    -  Primary   Relevant Medications   thyroid (ARMOUR THYROID) 30 MG tablet   thyroid (ARMOUR THYROID) 120 MG tablet   Other Relevant Orders   TSH     Adjusted patients Armor Thyroid dose from 180mg  to 150mg .  She should continue to take the medications daily as instructed.  Recheck in 3 months.  Follow Up Instructions:    I discussed the assessment and treatment plan with the patient. The patient was provided  an opportunity to ask questions and all were answered. The patient agreed with the plan and demonstrated an understanding of the instructions.   The patient was advised to call back or seek an in-person evaluation if the symptoms worsen or if the condition fails to improve as anticipated.  I provided 15 minutes of non-face-to-face time during this encounter.   Forrest Moron, MD

## 2019-08-02 NOTE — Patient Instructions (Signed)
° ° ° °  If you have lab work done today you will be contacted with your lab results within the next 2 weeks.  If you have not heard from us then please contact us. The fastest way to get your results is to register for My Chart. ° ° °IF you received an x-ray today, you will receive an invoice from [facility]. Please contact [facility] at [phone] with questions or concerns regarding your invoice.  ° °IF you received labwork today, you will receive an invoice from [facility]. Please contact [facility] at [phone] with questions or concerns regarding your invoice.  ° °Our billing staff will not be able to assist you with questions regarding bills from these companies. ° °You will be contacted with the lab results as soon as they are available. The fastest way to get your results is to activate your My Chart account. Instructions are located on the last page of this paperwork. If you have not heard from us regarding the results in 2 weeks, please contact this office. °  ° ° ° °

## 2019-08-05 ENCOUNTER — Encounter (HOSPITAL_COMMUNITY): Payer: Self-pay | Admitting: Emergency Medicine

## 2019-08-05 ENCOUNTER — Encounter (HOSPITAL_COMMUNITY): Payer: Self-pay

## 2019-08-05 ENCOUNTER — Inpatient Hospital Stay (HOSPITAL_COMMUNITY): Payer: Medicare Other

## 2019-08-05 ENCOUNTER — Encounter (HOSPITAL_COMMUNITY): Payer: Self-pay | Admitting: Internal Medicine

## 2019-08-05 ENCOUNTER — Emergency Department (HOSPITAL_COMMUNITY): Payer: Medicare Other

## 2019-08-05 ENCOUNTER — Ambulatory Visit (HOSPITAL_COMMUNITY)
Admission: RE | Admit: 2019-08-05 | Discharge: 2019-08-05 | Disposition: A | Discharge status: Home / Self Care | Payer: Private Health Insurance - Indemnity | Source: Ambulatory Visit | Attending: Interventional Radiology | Admitting: Interventional Radiology

## 2019-08-05 ENCOUNTER — Other Ambulatory Visit: Payer: Self-pay

## 2019-08-05 ENCOUNTER — Inpatient Hospital Stay (HOSPITAL_COMMUNITY)
Admission: EM | Admit: 2019-08-05 | Discharge: 2019-08-09 | DRG: 339 | Disposition: A | Discharge status: Home / Self Care | Payer: Medicare Other | Attending: Internal Medicine | Admitting: Internal Medicine

## 2019-08-05 ENCOUNTER — Telehealth: Payer: Self-pay | Admitting: Family Medicine

## 2019-08-05 DIAGNOSIS — M810 Age-related osteoporosis without current pathological fracture: Secondary | ICD-10-CM | POA: Diagnosis present

## 2019-08-05 DIAGNOSIS — E876 Hypokalemia: Secondary | ICD-10-CM | POA: Diagnosis present

## 2019-08-05 DIAGNOSIS — I110 Hypertensive heart disease with heart failure: Secondary | ICD-10-CM | POA: Diagnosis not present

## 2019-08-05 DIAGNOSIS — Z8249 Family history of ischemic heart disease and other diseases of the circulatory system: Secondary | ICD-10-CM

## 2019-08-05 DIAGNOSIS — K3533 Acute appendicitis with perforation and localized peritonitis, with abscess: Secondary | ICD-10-CM | POA: Diagnosis present

## 2019-08-05 DIAGNOSIS — K36 Other appendicitis: Secondary | ICD-10-CM

## 2019-08-05 DIAGNOSIS — Z7901 Long term (current) use of anticoagulants: Secondary | ICD-10-CM | POA: Diagnosis not present

## 2019-08-05 DIAGNOSIS — Z79890 Hormone replacement therapy (postmenopausal): Secondary | ICD-10-CM | POA: Diagnosis not present

## 2019-08-05 DIAGNOSIS — I5020 Unspecified systolic (congestive) heart failure: Secondary | ICD-10-CM | POA: Diagnosis not present

## 2019-08-05 DIAGNOSIS — Z20822 Contact with and (suspected) exposure to covid-19: Secondary | ICD-10-CM | POA: Diagnosis present

## 2019-08-05 DIAGNOSIS — K651 Peritoneal abscess: Secondary | ICD-10-CM | POA: Diagnosis not present

## 2019-08-05 DIAGNOSIS — I130 Hypertensive heart and chronic kidney disease with heart failure and stage 1 through stage 4 chronic kidney disease, or unspecified chronic kidney disease: Secondary | ICD-10-CM | POA: Diagnosis present

## 2019-08-05 DIAGNOSIS — M1990 Unspecified osteoarthritis, unspecified site: Secondary | ICD-10-CM | POA: Diagnosis present

## 2019-08-05 DIAGNOSIS — L988 Other specified disorders of the skin and subcutaneous tissue: Secondary | ICD-10-CM | POA: Diagnosis present

## 2019-08-05 DIAGNOSIS — Z978 Presence of other specified devices: Secondary | ICD-10-CM | POA: Diagnosis not present

## 2019-08-05 DIAGNOSIS — I4891 Unspecified atrial fibrillation: Secondary | ICD-10-CM | POA: Diagnosis not present

## 2019-08-05 DIAGNOSIS — I4821 Permanent atrial fibrillation: Secondary | ICD-10-CM | POA: Diagnosis present

## 2019-08-05 DIAGNOSIS — N1832 Chronic kidney disease, stage 3b: Secondary | ICD-10-CM | POA: Diagnosis present

## 2019-08-05 DIAGNOSIS — Z9089 Acquired absence of other organs: Secondary | ICD-10-CM | POA: Diagnosis not present

## 2019-08-05 DIAGNOSIS — I5022 Chronic systolic (congestive) heart failure: Secondary | ICD-10-CM | POA: Diagnosis present

## 2019-08-05 DIAGNOSIS — Z79899 Other long term (current) drug therapy: Secondary | ICD-10-CM | POA: Diagnosis not present

## 2019-08-05 DIAGNOSIS — L02211 Cutaneous abscess of abdominal wall: Secondary | ICD-10-CM

## 2019-08-05 DIAGNOSIS — E039 Hypothyroidism, unspecified: Secondary | ICD-10-CM | POA: Diagnosis present

## 2019-08-05 DIAGNOSIS — Z8719 Personal history of other diseases of the digestive system: Secondary | ICD-10-CM | POA: Diagnosis not present

## 2019-08-05 DIAGNOSIS — I509 Heart failure, unspecified: Secondary | ICD-10-CM

## 2019-08-05 DIAGNOSIS — R1031 Right lower quadrant pain: Secondary | ICD-10-CM

## 2019-08-05 DIAGNOSIS — N183 Chronic kidney disease, stage 3 unspecified: Secondary | ICD-10-CM

## 2019-08-05 HISTORY — PX: IR CATHETER TUBE CHANGE: IMG717

## 2019-08-05 LAB — URINALYSIS, ROUTINE W REFLEX MICROSCOPIC
Bilirubin Urine: NEGATIVE
Glucose, UA: NEGATIVE mg/dL
Hgb urine dipstick: NEGATIVE
Ketones, ur: NEGATIVE mg/dL
Leukocytes,Ua: NEGATIVE
Nitrite: NEGATIVE
Protein, ur: NEGATIVE mg/dL
Specific Gravity, Urine: 1.015 (ref 1.005–1.030)
pH: 6 (ref 5.0–8.0)

## 2019-08-05 LAB — CBC
HCT: 44.2 % (ref 36.0–46.0)
Hemoglobin: 14.8 g/dL (ref 12.0–15.0)
MCH: 30.8 pg (ref 26.0–34.0)
MCHC: 33.5 g/dL (ref 30.0–36.0)
MCV: 92.1 fL (ref 80.0–100.0)
Platelets: 299 10*3/uL (ref 150–400)
RBC: 4.8 MIL/uL (ref 3.87–5.11)
RDW: 16.4 % — ABNORMAL HIGH (ref 11.5–15.5)
WBC: 12.3 10*3/uL — ABNORMAL HIGH (ref 4.0–10.5)
nRBC: 0 % (ref 0.0–0.2)

## 2019-08-05 LAB — HEPARIN LEVEL (UNFRACTIONATED): Heparin Unfractionated: <0.1 IU/mL — ABNORMAL LOW (ref 0.30–0.70)

## 2019-08-05 LAB — COMPREHENSIVE METABOLIC PANEL
ALT: 18 U/L (ref 0–44)
AST: 24 U/L (ref 15–41)
Albumin: 2.4 g/dL — ABNORMAL LOW (ref 3.5–5.0)
Alkaline Phosphatase: 66 U/L (ref 38–126)
Anion gap: 14 (ref 5–15)
BUN: 28 mg/dL — ABNORMAL HIGH (ref 8–23)
CO2: 36 mmol/L — ABNORMAL HIGH (ref 22–32)
Calcium: 9.1 mg/dL (ref 8.9–10.3)
Chloride: 89 mmol/L — ABNORMAL LOW (ref 98–111)
Creatinine, Ser: 1.36 mg/dL — ABNORMAL HIGH (ref 0.44–1.00)
GFR calc Af Amer: 39 mL/min — ABNORMAL LOW (ref >60)
GFR calc non Af Amer: 34 mL/min — ABNORMAL LOW (ref >60)
Glucose, Bld: 123 mg/dL — ABNORMAL HIGH (ref 70–99)
Potassium: 2.7 mmol/L — CL (ref 3.5–5.1)
Sodium: 139 mmol/L (ref 135–145)
Total Bilirubin: 1.2 mg/dL (ref 0.3–1.2)
Total Protein: 6.7 g/dL (ref 6.5–8.1)

## 2019-08-05 LAB — PROTIME-INR
INR: 1 (ref 0.8–1.2)
Prothrombin Time: 13.4 seconds (ref 11.4–15.2)

## 2019-08-05 LAB — APTT: aPTT: 29 seconds (ref 24–36)

## 2019-08-05 LAB — MAGNESIUM: Magnesium: 2.3 mg/dL (ref 1.7–2.4)

## 2019-08-05 LAB — SARS CORONAVIRUS 2 (TAT 6-24 HRS): SARS Coronavirus 2: NEGATIVE

## 2019-08-05 LAB — LIPASE, BLOOD: Lipase: 28 U/L (ref 11–51)

## 2019-08-05 LAB — LACTIC ACID, PLASMA: Lactic Acid, Venous: 1.3 mmol/L (ref 0.5–1.9)

## 2019-08-05 MED ORDER — FENTANYL CITRATE (PF) 100 MCG/2ML IJ SOLN
INTRAMUSCULAR | Status: AC | PRN
  Administered 2019-08-05: 25 ug via INTRAVENOUS

## 2019-08-05 MED ORDER — MAGNESIUM SULFATE 2 GM/50ML IV SOLN
2.0000 g | Freq: Once | INTRAVENOUS | Status: AC
  Administered 2019-08-05: 2 g via INTRAVENOUS
  Filled 2019-08-05: qty 50

## 2019-08-05 MED ORDER — VANCOMYCIN HCL IN DEXTROSE 1-5 GM/200ML-% IV SOLN: 1000.0000 mg | INTRAVENOUS | Status: DC

## 2019-08-05 MED ORDER — SODIUM CHLORIDE 0.9% FLUSH
5.0000 mL | Freq: Three times a day (TID) | INTRAVENOUS | Status: DC
  Administered 2019-08-05 – 2019-08-07 (×6): 5 mL

## 2019-08-05 MED ORDER — POTASSIUM CHLORIDE 10 MEQ/100ML IV SOLN
10.0000 meq | INTRAVENOUS | Status: AC
  Administered 2019-08-05 (×3): 10 meq via INTRAVENOUS
  Filled 2019-08-05 (×3): qty 100

## 2019-08-05 MED ORDER — SODIUM CHLORIDE 0.9 % IV BOLUS
500.0000 mL | Freq: Once | INTRAVENOUS | Status: AC
  Administered 2019-08-05: 12:00:00 500 mL via INTRAVENOUS

## 2019-08-05 MED ORDER — DILTIAZEM HCL ER COATED BEADS 240 MG PO CP24
240.0000 mg | ORAL_CAPSULE | Freq: Two times a day (BID) | ORAL | Status: DC
  Administered 2019-08-05 – 2019-08-09 (×8): 240 mg via ORAL
  Filled 2019-08-05 (×10): qty 1

## 2019-08-05 MED ORDER — POTASSIUM CHLORIDE CRYS ER 20 MEQ PO TBCR
40.0000 meq | EXTENDED_RELEASE_TABLET | Freq: Once | ORAL | Status: AC
  Administered 2019-08-05: 40 meq via ORAL
  Filled 2019-08-05: qty 2

## 2019-08-05 MED ORDER — METRONIDAZOLE IN NACL 5-0.79 MG/ML-% IV SOLN
500.0000 mg | Freq: Once | INTRAVENOUS | Status: DC
  Administered 2019-08-05: 09:00:00 500 mg via INTRAVENOUS
  Filled 2019-08-05: qty 100

## 2019-08-05 MED ORDER — IOHEXOL 300 MG/ML  SOLN
80.0000 mL | Freq: Once | INTRAMUSCULAR | Status: AC | PRN
  Administered 2019-08-05: 07:00:00 80 mL via INTRAVENOUS

## 2019-08-05 MED ORDER — PIPERACILLIN-TAZOBACTAM 3.375 G IVPB
3.3750 g | Freq: Three times a day (TID) | INTRAVENOUS | Status: DC
  Administered 2019-08-05 – 2019-08-08 (×8): 3.375 g via INTRAVENOUS
  Filled 2019-08-05 (×8): qty 50

## 2019-08-05 MED ORDER — LIDOCAINE HCL 1 % IJ SOLN
INTRAMUSCULAR | Status: AC
  Filled 2019-08-05: qty 20

## 2019-08-05 MED ORDER — METOPROLOL SUCCINATE ER 25 MG PO TB24
50.0000 mg | ORAL_TABLET | Freq: Every day | ORAL | Status: DC
  Administered 2019-08-06 – 2019-08-09 (×4): 50 mg via ORAL
  Filled 2019-08-05 (×4): qty 2

## 2019-08-05 MED ORDER — WARFARIN - PHARMACIST DOSING INPATIENT: Freq: Every day | Status: DC

## 2019-08-05 MED ORDER — POTASSIUM CHLORIDE 10 MEQ/100ML IV SOLN
10.0000 meq | INTRAVENOUS | Status: AC
  Administered 2019-08-05 (×2): 10 meq via INTRAVENOUS
  Filled 2019-08-05 (×2): qty 100

## 2019-08-05 MED ORDER — FENTANYL CITRATE (PF) 100 MCG/2ML IJ SOLN
INTRAMUSCULAR | Status: AC
  Administered 2019-08-05: 25 ug via INTRAVENOUS
  Filled 2019-08-05: qty 2

## 2019-08-05 MED ORDER — SODIUM CHLORIDE 0.9 % IV SOLN
2.0000 g | INTRAVENOUS | Status: DC
  Administered 2019-08-05: 08:00:00 2 g via INTRAVENOUS
  Filled 2019-08-05: qty 2

## 2019-08-05 MED ORDER — SODIUM CHLORIDE 0.9% FLUSH: 3.0000 mL | Freq: Once | INTRAVENOUS | Status: DC

## 2019-08-05 MED ORDER — WARFARIN SODIUM 5 MG PO TABS
5.0000 mg | ORAL_TABLET | Freq: Once | ORAL | Status: AC
  Administered 2019-08-05: 5 mg via ORAL
  Filled 2019-08-05: qty 1

## 2019-08-05 MED ORDER — LACTATED RINGERS IV SOLN: INTRAVENOUS | Status: AC

## 2019-08-05 MED ORDER — IOHEXOL 300 MG/ML  SOLN
50.0000 mL | Freq: Once | INTRAMUSCULAR | Status: AC | PRN
  Administered 2019-08-05: 10 mL

## 2019-08-05 MED ORDER — LIDOCAINE HCL 1 % IJ SOLN
INTRAMUSCULAR | Status: AC | PRN
  Administered 2019-08-05: 10 mL

## 2019-08-05 MED ORDER — HEPARIN (PORCINE) 25000 UT/250ML-% IV SOLN
1100.0000 [IU]/h | INTRAVENOUS | Status: DC
  Administered 2019-08-05: 900 [IU]/h via INTRAVENOUS
  Administered 2019-08-06: 1100 [IU]/h via INTRAVENOUS
  Filled 2019-08-05 (×2): qty 250

## 2019-08-05 MED ORDER — THYROID 30 MG PO TABS
150.0000 mg | ORAL_TABLET | Freq: Every day | ORAL | Status: DC
  Administered 2019-08-06 – 2019-08-09 (×3): 150 mg via ORAL
  Filled 2019-08-05 (×5): qty 1

## 2019-08-05 NOTE — ED Notes (Signed)
Report attempted, 6N unable to take at this time 

## 2019-08-05 NOTE — Progress Notes (Signed)
[place] for warfarin Indication: atrial fibrillation   Assessment: 33 yof on warfarin PTA for history of afib, perforated appendicitis with recent drain placement 05/2019 presenting with RLQ pain, going to IR for placement of new drain today. Pharmacy consulted to dose warfarin after drain placed. Patient has held warfarin since 4/7 per MD notes in anticipation of procedure. INR 1.0 on admit. CBC wnl, SCr 1.36. Patient did received Flagyl x 1 dose this AM, now transitioned to Zosyn.  PTA warfarin dose: 2.5mg  daily except 5mg  TTS (per last anticoagulation clinic visit 07/15/19)  Goal of Therapy:  INR 2-3 Monitor platelets by anticoagulation protocol: Yes   Plan:  Warfarin 5mg  PO x 1 dose Daily INR Monitor CBC, s/sx bleeding  Elicia Lamp, PharmD, BCPS Clinical Pharmacist 08/05/2019 10:36 AM

## 2019-08-05 NOTE — ED Notes (Signed)
Pt destated to 88% on room air. Placed on 2 L nasal cannula.

## 2019-08-05 NOTE — H&P (Signed)
Date: 08/05/2019               Patient Name:  Ashley Dunn MRN: [id number]  DOB: 02-25-1926 Age / Sex: 86 y.o., female   PCP: Forrest Moron, MD         Medical Service: Internal Medicine Teaching Service         Attending Physician: Dr. Aldine Contes, MD    First Contact: Dr. Benjamine Mola Pager: [phone]  Second Contact: Dr. Truman Hayward Pager: [phone]       After Hours (After 5p/  First Contact Pager: [phone]  weekends / holidays): Second Contact Pager: [phone]   Chief Complaint: abdominal pain and puss draining from the wound  History of Present Illness: Ashley Dunn is a 86 yo F w/ a PMHx notable for atrial fibrillation on Warfarin, HTN, HFrEF 46%, CKDIIIb and recent appendiceal abscess s/p drain placement. She was feeling well until late the evening prior to her ED visit when she developed severe RLQ abdominal pain that did not relieve until she began draining copious amounts of serosanguineous drainage from the first drain site. She denied fever, cough, shortness of breath, chest pain, headache, visual changes, fatigue, new joint pain, rash, muscle aches or malaise. She endorses only occasional chills and increased stool frequency with intermittent formed and loose stool.  Her first drain was placed during an admission ending 02/07 for a perforated appendicitis with subsequent large abscess formation. She was seen in the IR drain clinic on 06/25/19 were a small residual fluid collection was noted with no fistulous connection. The drain was later pulled by her surgeon later that month. However, shortly thereafter she began to note returning symptoms with a palpable hardened area under the skin which later ruptured relieving her symptoms. It is not certain if this occurred before or after there CT abd on 3/19. The repeat CT ABD indicated possible recurrent abscess for which a second drain was placed on 07/17/19 by radiology. Per the patient, since that time there has been no drainage form the  replacement drain and her symptoms have begun to return but initially remained stable until 4/13 late in the day.  In the ER general surgery was consulted who recommended repeat CT and IR consult for placement of a new drain. This has been arranged given the CT findings as below. The patients WBC was 12.3, Hgb WNL's, sCr improved at 1.36, potassium 2.7 and CO2 36. She has been off of her Warfarin since 3/19 per patient when this was held for her procedure on 3/24. She denied resuming this.  Of note, she was positive for toxigenic C. Diff by PCR on 2/24 although her loose stool had cleared by by 2/26. She was treated with PO vanc and repeat PCR testing on 3/22 was interestingly performed which was negative.   Meds:  Current Outpatient Medications  Medication Instructions  . amoxicillin-clavulanate (AUGMENTIN) 875-125 MG tablet 1 tablet, Oral, 2 times daily  . diltiazem (CARDIZEM CD) 240 mg, Oral, 2 times daily  . metoprolol succinate (TOPROL-XL) 50 mg, Oral, 2 times daily  . thyroid (ARMOUR THYROID) 30 mg, Oral, Daily before breakfast  . thyroid (ARMOUR THYROID) 120 mg, Oral, Daily before breakfast  . torsemide (DEMADEX) 20 mg, Oral, 2 times daily  . warfarin (COUMADIN) 2.5-5 mg, Oral, See admin instructions, Take 2.5 mg by mouth on Mon, Wed, Fri and Sun after supper. Take 5 MG Tuesday, Thursday and Saturday after supper.   Allergies: Allergies as of 08/05/2019  . (  No Known Allergies)   Past Medical History:  Diagnosis Date  . Arthritis   . Atrial fibrillation ([place])   . Cataract   . CHF (congestive heart failure) ([place])   . Hypertension   . Osteoporosis   . Thyroid disease    Family History:  Colon cancer father, deceased CVA, mother HLD, brother  Social History:  Patient denied significant smoking history She denied EtOH use She denied illicit drug use She lives at home with daughter and has good social support  Review of Systems: A complete ROS was negative except as per  HPI.   Physical Exam: Blood pressure (!) 109/59, pulse 87, temperature 98.4 F (36.9 C), temperature source Oral, resp. rate 18, height 5' (1.524 m), weight 75 kg, SpO2 99 %. Physical Exam Constitutional:      General: She is not in acute distress.    Appearance: She is well-developed. She is not diaphoretic.  HENT:     Head: Normocephalic and atraumatic.  Eyes:     Conjunctiva/sclera: Conjunctivae normal.  Cardiovascular:     Rate and Rhythm: Normal rate. Rhythm irregular.     Heart sounds: No murmur.  Pulmonary:     Effort: Pulmonary effort is normal. No respiratory distress.     Breath sounds: Normal breath sounds. No stridor.  Abdominal:     General: Bowel sounds are normal. There is no distension.     Palpations: Abdomen is soft.     Tenderness: There is abdominal tenderness (Surrounding the drain site with purulent discharge) in the right lower quadrant.  Musculoskeletal:        General: No tenderness.     Cervical back: Normal range of motion and neck supple.  Skin:    General: Skin is warm.     Capillary Refill: Capillary refill takes less than 2 seconds.  Neurological:     Mental Status: She is alert and oriented to person, place, and time.  Psychiatric:        Mood and Affect: Mood normal.        Behavior: Behavior normal.    EKG: personally reviewed my interpretation is that one is ordered but has not been completed   CT ABD: IMPRESSION: 1. Pigtail drainage catheter appears located within a dilated appendix or large periappendiceal abscess, which is again associated with a fistulous tract which drains through the right lateral abdominal wall musculature to the overlying skin surface. This overall appears very similar to the recent prior CT 07/30/2019. 2. Pleural-based nodule in the posterior aspect of the right lower lobe measuring 2.8 x 1.0 cm. This appears roughly stable in size dating back to 05/29/2019. The stability is reassuring, however,  the possibility of neoplasm is not excluded. Close attention on follow-up imaging is recommended, with repeat noncontrast chest CT in 3-6 months to continue surveillance of this lesion. 3. Aortic atherosclerosis, in addition to at least 2 vessel coronary artery disease. 4. Cardiomegaly with biatrial dilatation. 5. Additional incidental findings, as above.  Assessment & Plan by Problem: Principal Problem:   Abscess of abdominal cavity (HCC) Active Problems:   Permanent atrial fibrillation (HCC)   CHF (congestive heart failure) (HCC)   Chronic kidney disease (CKD), stage III (moderate)  Assessment: Ashley Dunn is a 86 yo F w/ a PMHx notable for atrial fibrillation on Warfarin, HTN, HFrEF 46%, CKDIIIb and recent appendiceal abscess s/p drain placement. She was feeling well until late the evening prior to her ED visit when she developed severe RLQ abdominal pain  that did not relieve until she began draining copious amounts of serosanguineous drainage from the first drain site. This appears to be a persistent abscess without recovery from her related appendiceal abscess formation with fistular formation from the site 2/2 to drain placement. She requires admission for line replacement and observation due to the severity of her pain, advanced age, her soft BP (Hx of HTN) and elevated white count.   Plan: Abscess of abdominal cavity: Surgery advised IR drain placement which appears to be scheduled for today. CT confirmed persistent abscess with fistula. She will continue on Zosyn per surgeries request and gentle IV fluids.  Plan: -IR to place drain today -Zosyn per pharmacy -Stop cefepime/metronidazole -LR 50cc/hr  -NPO until advanced by surgery -CBC/BMP in am  Atrial Fibrillation: Tele not connected, no EKG on file from today. EKG ordered, rhythm irregular on exam. We will continue her rate control and anticoagulation following her procedure. Plan: -Warfarin per pharmacy following drain  placement -Continue Cardizem and metoprolol  CHF: EF 46% in March 2021. No edema, dyspnea or orthopnea on exam today. We will monitor this and only gently hydrate while admitted.   CKDIIIb: Associated with hypokalemia. Stable sCr today as compared to prior. Gentle fluids while NPO.  Plan: -Daily BMP monitoring -ER repleted 3 runs IV potassium -Additional 3 runs ordered -Repeat BMP in am -Start PO repletion when diet is advanced  Hx of C. diff: Antibiotic use with intermittent loose and formed stool. C. Diff PCR positive, treated with PO vanc with repeat testing performed which was negative. Stool continued to be intermittently loose and formed which is highly inconsistent with C. Diff. Will monitor this admission and consider testing if indicated.   Hypothyroidism: No current symptoms of marked hyper/hypothyroidism. Dose recently changed by her PCP to 150mg  thyroid. We will continue this during her admission.   Diet: NPO, advance as per surgery Code: Patient requested DNR, CPR was explained DVT PPX:  Fluids and electrolytes:  Dispo: Admit patient to Inpatient with expected length of stay greater than 2 midnights.  Signed: Kathi Ludwig, MD 08/05/2019, 10:25 AM

## 2019-08-05 NOTE — ED Provider Notes (Signed)
Care assumed from [place] at shift change, please see her note for full details, but in brief Ashley Dunn is a 86 y.o. female with a history of A. fib, and appendicitis with large abscess which was initially diagnosed on 05/29/2019.  Patient had IR drain placement with significant improvement, she was discharged a few days later, had initial drain removed but due to incompletely drained complex abscess noted a drain was again placed on 3/24, there has been little to no drainage from new drain since placement.  Around 5 AM patient reports she had onset of severe right lower quadrant pain and began spontaneously draining purulent material from the initial drain site.  She reports once the area began draining she had some improvement in her pain and is currently comfortable.  She has not had fevers or chills.  She was initially scheduled with IR to have a new drain placed today but due to spontaneous drainage presented to the emergency department.  On arrival patient was also found to be mildly hypoxic at 88% with no associated shortness of breath or respiratory symptoms, could be in the setting of severe infection, stabilized with 2 L nasal cannula.  Sepsis labs started and patient started on IV antibiotics.  Previous care team discussed with Dr. Dema Severin with general surgery who recommended getting CT abdomen pelvis as long as this shows continued fluid collection would recommend proceeding with IR drainage and medicine admission for IV antibiotics.  Lab work thus far shows mild leukocytosis of 12.3.  Hypokalemia of 2.7, IV magnesium and potassium replacement ordered.  Creatinine at baseline.  Plan: Will follow up on CT scan, discussed with surgery, anticipate medicine admission and IR drainage.   BP 124/70   Pulse 81   Temp 98.4 F (36.9 C) (Oral)   Resp 18   Ht 5' (1.524 m)   Wt 75 kg   SpO2 92%   BMI 32.29 kg/m    ED Course/Procedures   Labs Reviewed  COMPREHENSIVE METABOLIC PANEL  - Abnormal; Notable for the following components:      Result Value   Potassium 2.7 (*)    Chloride 89 (*)    CO2 36 (*)    Glucose, Bld 123 (*)    BUN 28 (*)    Creatinine, Ser 1.36 (*)    Albumin 2.4 (*)    GFR calc non Af Amer 34 (*)    GFR calc Af Amer 39 (*)    All other components within normal limits  CBC - Abnormal; Notable for the following components:   WBC 12.3 (*)    RDW 16.4 (*)    All other components within normal limits  CULTURE, BLOOD (ROUTINE X 2)  CULTURE, BLOOD (ROUTINE X 2)  SARS CORONAVIRUS 2 (TAT 6-24 HRS)  LIPASE, BLOOD  URINALYSIS, ROUTINE W REFLEX MICROSCOPIC  LACTIC ACID, PLASMA  APTT  PROTIME-INR  MAGNESIUM  LACTIC ACID, PLASMA    CT ABDOMEN PELVIS W CONTRAST  Result Date: 08/05/2019 CLINICAL DATA:  86 year old female with history of abdominal pain. EXAM: CT ABDOMEN AND PELVIS WITH CONTRAST TECHNIQUE: Multidetector CT imaging of the abdomen and pelvis was performed using the standard protocol following bolus administration of intravenous contrast. CONTRAST:  35mL OMNIPAQUE IOHEXOL 300 MG/ML  SOLN COMPARISON:  CT the abdomen and pelvis 07/30/2019. FINDINGS: Lower chest: In the posterior aspect of the right lower lobe there is a pleural-based lesion measuring 2.8 x 1.0 cm (axial image 16 of series 5), which appears similar to  the recent prior examination, as well as more remote prior study from 05/29/2019. Atherosclerotic calcifications in the thoracic aorta as well as the left circumflex and right coronary arteries. Mild cardiomegaly with biatrial dilatation. Hepatobiliary: No suspicious cystic or solid hepatic lesions. No intra or extrahepatic biliary ductal dilatation. Gallbladder is normal in appearance. Pancreas: No pancreatic mass. No pancreatic ductal dilatation. No pancreatic or peripancreatic fluid collections or inflammatory changes. Spleen: Unremarkable. Adrenals/Urinary Tract: Multiple low-attenuation lesions are noted in both kidneys, compatible  with simple cysts, largest of which is in the upper pole the right kidney measuring 3.3 cm in diameter. No suspicious renal lesions. No hydroureteronephrosis. Urinary bladder is normal in appearance. Bilateral adrenal glands are within normal limits. Stomach/Bowel: Normal appearance of the stomach. No pathologic dilatation of small bowel or colon. The appendix is dilated measuring up to 3 cm in diameter. Hip pigtail drainage catheter is in place within the dilated appendix. Extending from the dilated appendix through the right lateral abdominal wall musculature and to the skin surface there is a fistulous tract with fluid and gas, and surrounding rim enhancement. This is irregular in shape and therefore difficult to discretely measure, but appears grossly similar to the recent prior examination. Vascular/Lymphatic: Aortic atherosclerosis, without evidence of aneurysm or dissection in the abdominal or pelvic vasculature. No lymphadenopathy noted in the abdomen or pelvis. Reproductive: Uterus and ovaries are unremarkable in appearance. Other: No significant volume of ascites.  No pneumoperitoneum. Musculoskeletal: There are no aggressive appearing lytic or blastic lesions noted in the visualized portions of the skeleton. Chronic compression fractures of T12 and L3, most severe at T12 where there is 50% loss of anterior vertebral body height. IMPRESSION: 1. Pigtail drainage catheter appears located within a dilated appendix or large periappendiceal abscess, which is again associated with a fistulous tract which drains through the right lateral abdominal wall musculature to the overlying skin surface. This overall appears very similar to the recent prior CT 07/30/2019. 2. Pleural-based nodule in the posterior aspect of the right lower lobe measuring 2.8 x 1.0 cm. This appears roughly stable in size dating back to 05/29/2019. The stability is reassuring, however, the possibility of neoplasm is not excluded. Close  attention on follow-up imaging is recommended, with repeat noncontrast chest CT in 3-6 months to continue surveillance of this lesion. 3. Aortic atherosclerosis, in addition to at least 2 vessel coronary artery disease. 4. Cardiomegaly with biatrial dilatation. 5. Additional incidental findings, as above. Electronically Signed   By: Vinnie Langton M.D.   On: 08/05/2019 08:26   .Critical Care Performed by: Jacqlyn Larsen, PA-C Authorized by: Jacqlyn Larsen, PA-C   Critical care provider statement:    Critical care time (minutes):  45   Critical care was necessary to treat or prevent imminent or life-threatening deterioration of the following conditions:  Metabolic crisis (Large intra-abdominal abscess, hypokalemia)   Critical care was time spent personally by me on the following activities:  Discussions with consultants, evaluation of patient's response to treatment, examination of patient, ordering and performing treatments and interventions, ordering and review of laboratory studies, ordering and review of radiographic studies, pulse oximetry, re-evaluation of patient's condition, obtaining history from patient or surrogate and review of old charts    MDM   CT returned, which shows pigtail drainage catheter located within a dilated appendix with a large periappendiceal abscess, there continues to be an ill-defined fluid collection that is not significantly changed from most recent imaging.  I discussed with surgery PA Claiborne Billings  who confirms that given no significant changes on CT we will proceed with medicine admission, and IR drainage.  Discussed with Dr. Reesa Chew with IR who his family are with patient, they will plan to take patient today for additional drain placement, requests order be placed for IR drain, this has been completed.  Case discussed with internal medicine teaching service, who will see and admit the patient for continued IV antibiotics as well as potassium replacement   Final  diagnoses:  RLQ abdominal pain  Other appendicitis  Abdominal wall abscess  Appendicitis with abscess  Hypokalemia     Jacqlyn Larsen, PA-C 08/05/19 5800    Carmin Muskrat, MD 08/05/19 1511

## 2019-08-05 NOTE — ED Triage Notes (Signed)
Patient reports right low abdominal pain last night , she is scheduled for surgery today for appendectomy , denies pain at triage , no emesis or diarrhea ,no fever or chills ,  patient's daughter stated large amount of drainage from her abdominal drain tube last night .

## 2019-08-05 NOTE — Progress Notes (Addendum)
Pharmacy Antibiotic Note  Ashley Dunn is a 86 y.o. female admitted on 08/05/2019 with sepsis.  Pharmacy has been consulted forCefepime dosing. Recent proteus appendiceal abscess with drain placement. WBC 12.3. Noted bump in Scr.   Plan: Cefepime 2g IV q24h Trend WBC, temp, renal function  F/U infectious work-up Drug levels as indicated   Height: 5' (152.4 cm) Weight: 75 kg (165 lb 5.5 oz) IBW/kg (Calculated) : 45.5  Temp (24hrs), Avg:98.4 F (36.9 C), Min:98.4 F (36.9 C), Max:98.4 F (36.9 C)  Recent Labs  Lab 08/05/19 0536  WBC 12.3*  CREATININE 1.36*    Estimated Creatinine Clearance: 24.4 mL/min (A) (by C-G formula based on SCr of 1.36 mg/dL (H)).    No Known Allergies  Narda Bonds, PharmD, BCPS Clinical Pharmacist Phone: [phone]

## 2019-08-05 NOTE — Progress Notes (Addendum)
Ashley Dunn for warfarin >> heparin Indication: atrial fibrillation   Assessment: 60 yof on warfarin PTA for history of afib, perforated appendicitis with recent drain placement 05/2019 presenting with RLQ pain, going to IR for placement of new drain today. Pharmacy consulted to dose warfarin after drain placed. Patient has held warfarin since 4/7 per MD notes in anticipation of procedure. INR 1.0 on admit. CBC wnl, SCr 1.36. Patient did received Flagyl x 1 dose this AM, now transitioned to Zosyn.  PTA warfarin dose: 2.5mg  daily except 5mg  TTS (per last anticoagulation clinic visit 07/15/19)  Goal of Therapy:  INR 2-3 Monitor platelets by anticoagulation protocol: Yes   Plan:  Warfarin 5mg  PO x 1 dose Daily INR Monitor CBC, s/sx bleeding  Elicia Lamp, PharmD, BCPS Clinical Pharmacist 08/05/2019 1:16 PM    ADDENDUM:  Pharmacy consulted to d/c warfarin and start heparin IV, as patient may now need further intervention with worsening peristent abscess despite drain management x 8 weeks. Ok to start heparin now, as INR<2.  Heparin dosing weight: 62.3 kg  Plan: Hold warfarin No bolus. Start heparin at 900 units/hr 8h heparin level Monitor daily heparin level and CBC, s/sx bleeding F/u Surgery plans   Arturo Morton, PharmD, BCPS Please check AMION for all [facility] contact numbers Clinical Pharmacist 08/05/2019 1:20 PM

## 2019-08-05 NOTE — Procedures (Signed)
Interventional Radiology Procedure Note  Procedure: IR RLQ DRAIN [place] AND UPSIZE  Complications: None  Estimated Blood Loss: MIN  Findings:  DRAIN UPSIZED TO 14 FR AND 30CC MUCINOUS FECAL PURULENT AND VERY THICK FLUID ASPIRATED.  ABSCESS IRRIGATED WITH SALINE AND CONNECTED TO SUCTION BULB.  cx sent  She has had drain management of this for nearly 8 weeks and continues to have a worsen persistent abscess, now with an adjacent draining fistula from an old drain site.  She appears to be failing drain management, unfortunately.

## 2019-08-05 NOTE — Telephone Encounter (Signed)
Pharmacy called and was wondering if they could do a DAW1 on the medication  thyroid (ARMOUR THYROID) 120 MG tablet [[id number]]   thyroid (ARMOUR THYROID) 30 MG tablet [[id number]]   Gave to [employer] to ask provider if pharmacy can do that. Stated they would call pharmacy to give them their answer.

## 2019-08-05 NOTE — ED Provider Notes (Signed)
[facility] EMERGENCY DEPARTMENT Provider Note   CSN: [id number] Arrival date & time: 08/05/19  0505     History Chief Complaint  Patient presents with  . Abdominal Pain    Ashley Dunn is a 86 y.o. female with a hx of A. Fib (anticoagulated) presents to the Emergency Department complaining of gradual, persistent, progressively worsening lower quadrant abdominal pain onset around 10 PM with improvement around 5 AM. Patient and daughter provide history together. Patient was diagnosed with appendicitis 05/29/2019. CT scan at that time showed a fairly large abscess therefore general surgery recommended IR management. Drain was placed with significant improvement and patient was discharged on 06/02/2019. Follow-up on 06/11/2019 CT scan demonstrated complex abscess that was incompletely drained and current drain had minimal output. Drain was replaced on 07/17/2019 and daughter reports almost no drainage from the second drain placement. Tonight around 5 AM when patient's pain improved she began spontaneously draining purulent material and blood from the site of the first percutaneous drain. Daughter reports area around the drain and initial drain site is erythematous, hot to touch, hard and painful. Patient reports after the " rupture" her pain improved significantly. Prior to that there had been no specific aggravating or alleviating factors. Patient denies fever, chills, headache, neck pain, chest pain, shortness of breath, weakness, dizziness, syncope. Patient was scheduled for new IR drain placement today at 9 AM.  The history is provided by the patient, medical records and a relative. No language interpreter was used.       Past Medical History:  Diagnosis Date  . Arthritis   . Atrial fibrillation ([place])   . Cataract   . CHF (congestive heart failure) ([place])   . Hypertension   . Osteoporosis   . Thyroid disease     Patient Active Problem List   Diagnosis Date Noted  .  Pre-op evaluation 06/17/2019  . Hypertension   . Osteoporosis   . Thyroid disease   . CHF (congestive heart failure) ([place])   . Arthritis   . Appendiceal abscess 05/29/2019  . Atrial fibrillation ([place]) 03/19/2019  . Permanent atrial fibrillation ([place]) 02/26/2019    Past Surgical History:  Procedure Laterality Date  . CESAREAN SECTION    . EYE SURGERY    . FRACTURE SURGERY    . IR CATHETER TUBE CHANGE  06/12/2019  . IR RADIOLOGIST EVAL & MGMT  06/11/2019  . IR RADIOLOGIST EVAL & MGMT  06/25/2019  . IR RADIOLOGIST EVAL & MGMT  07/30/2019     OB History   No obstetric history on file.     Family History  Problem Relation Age of Onset  . Stroke Mother   . Cancer Father   . Hyperlipidemia Brother   . Healthy Daughter   . Healthy Son   . Heart disease Maternal Grandmother   . Heart disease Maternal Grandfather     Social History   Tobacco Use  . Smoking status: Never Smoker  . Smokeless tobacco: Never Used  Substance Use Topics  . Alcohol use: Never  . Drug use: Never    Home Medications Prior to Admission medications   Medication Sig Start Date End Date Taking? Authorizing Provider  amoxicillin-clavulanate (AUGMENTIN) 875-125 MG tablet Take 1 tablet by mouth 2 (two) times daily. 07/11/19   [provider]  diltiazem (CARDIZEM CD) 240 MG 24 hr capsule Take 240 mg by mouth 2 (two) times daily.  12/28/18   [provider]  metoprolol succinate (TOPROL-XL) 50 MG  24 hr tablet Take 1 tablet (50 mg total) by mouth in the morning and at bedtime. 07/25/19   Patwardhan, Reynold Bowen, MD  thyroid (ARMOUR THYROID) 120 MG tablet Take 1 tablet (120 mg total) by mouth daily before breakfast. 08/02/19   Forrest Moron, MD  thyroid (ARMOUR THYROID) 30 MG tablet Take 1 tablet (30 mg total) by mouth daily before breakfast. 08/02/19   Forrest Moron, MD  torsemide (DEMADEX) 20 MG tablet Take 20 mg by mouth 2 (two) times daily.  01/15/19   [provider]  warfarin (COUMADIN)  5 MG tablet Take 2.5-5 mg by mouth See admin instructions. Take 2.5 mg by mouth on Mon, Wed, Fri and Sun after supper. Take 5 MG Tuesday, Thursday and Saturday after supper. 12/28/18   [provider]    Allergies    Patient has no known allergies.  Review of Systems   Review of Systems  Constitutional: Negative for appetite change, diaphoresis, fatigue, fever and unexpected weight change.  HENT: Negative for mouth sores.   Eyes: Negative for visual disturbance.  Respiratory: Negative for cough, chest tightness, shortness of breath and wheezing.   Cardiovascular: Negative for chest pain.  Gastrointestinal: Positive for abdominal pain. Negative for constipation, diarrhea, nausea and vomiting.  Endocrine: Negative for polydipsia, polyphagia and polyuria.  Genitourinary: Negative for dysuria, frequency, hematuria and urgency.  Musculoskeletal: Negative for back pain and neck stiffness.  Skin: Positive for color change. Negative for rash.  Allergic/Immunologic: Negative for immunocompromised state.  Neurological: Negative for syncope, light-headedness and headaches.  Hematological: Does not bruise/bleed easily.  Psychiatric/Behavioral: Negative for sleep disturbance. The patient is not nervous/anxious.     Physical Exam Updated Vital Signs BP (!) 119/58 (BP Location: Left Arm)   Pulse 82   Temp 98.4 F (36.9 C) (Oral)   Resp 16   Ht 5' (1.524 m)   Wt 75 kg   SpO2 98%   BMI 32.29 kg/m   Physical Exam Vitals and nursing note reviewed.  Constitutional:      General: She is not in acute distress.    Appearance: She is not diaphoretic.  HENT:     Head: Normocephalic.  Eyes:     General: No scleral icterus.    Conjunctiva/sclera: Conjunctivae normal.  Cardiovascular:     Rate and Rhythm: Normal rate and regular rhythm.     Pulses: Normal pulses.          Radial pulses are 2+ on the right side and 2+ on the left side.  Pulmonary:     Effort: No tachypnea, accessory  muscle usage, prolonged expiration, respiratory distress or retractions.     Breath sounds: No stridor.     Comments: Equal chest rise. No increased work of breathing. Abdominal:     General: There is no distension.     Palpations: Abdomen is soft.     Tenderness: There is abdominal tenderness in the right lower quadrant. There is no right CVA tenderness, left CVA tenderness, guarding or rebound.     Comments: Percutaneous drain in place in the right lower quadrant. Surrounding skin is erythematous, edematous, hot to touch and tender. Purulent and bloody fluid is draining from the site of the initial percutaneous drain with necrotic tissue.  Musculoskeletal:     Cervical back: Normal range of motion.     Comments: Moves all extremities equally and without difficulty.  Skin:    General: Skin is warm and dry.     Capillary  Refill: Capillary refill takes less than 2 seconds.  Neurological:     Mental Status: She is alert.     GCS: GCS eye subscore is 4. GCS verbal subscore is 5. GCS motor subscore is 6.     Comments: Speech is clear and goal oriented.  Psychiatric:        Mood and Affect: Mood normal.     ED Results / Procedures / Treatments   Labs (all labs ordered are listed, but only abnormal results are displayed) Labs Reviewed  COMPREHENSIVE METABOLIC PANEL - Abnormal; Notable for the following components:      Result Value   Potassium 2.7 (*)    Chloride 89 (*)    CO2 36 (*)    Glucose, Bld 123 (*)    BUN 28 (*)    Creatinine, Ser 1.36 (*)    Albumin 2.4 (*)    GFR calc non Af Amer 34 (*)    GFR calc Af Amer 39 (*)    All other components within normal limits  CBC - Abnormal; Notable for the following components:   WBC 12.3 (*)    RDW 16.4 (*)    All other components within normal limits  CULTURE, BLOOD (ROUTINE X 2)  CULTURE, BLOOD (ROUTINE X 2)  SARS CORONAVIRUS 2 (TAT 6-24 HRS)  LIPASE, BLOOD  URINALYSIS, ROUTINE W REFLEX MICROSCOPIC  LACTIC ACID, PLASMA    LACTIC ACID, PLASMA  APTT  PROTIME-INR  MAGNESIUM    Procedures Procedures (including critical care time)  Medications Ordered in ED Medications  sodium chloride flush (NS) 0.9 % injection 3 mL (3 mLs Intravenous Not Given 08/05/19 0554)  ceFEPIme (MAXIPIME) 2 g in sodium chloride 0.9 % 100 mL IVPB (has no administration in time range)  metroNIDAZOLE (FLAGYL) IVPB 500 mg (has no administration in time range)  potassium chloride 10 mEq in 100 mL IVPB (has no administration in time range)  magnesium sulfate IVPB 2 g 50 mL (has no administration in time range)    ED Course  I have reviewed the triage vital signs and the nursing notes.  Pertinent labs & imaging results that were available during my care of the patient were reviewed by me and considered in my medical decision making (see chart for details).  Clinical Course as of Aug 04 700  Mon Aug 05, 2019  8185 Noted.  Potassium and magnesium ordered.  Potassium(!!): 2.7 [HM]  0638 Leukocytosis noted.  WBC(!): 12.3 [HM]    Clinical Course User Index [HM] Mikeya Tomasetti, Gwenlyn Perking   MDM Rules/Calculators/A&P                       Patient with a history of appendicitis and abscess presents with right lower quadrant abdominal pain and draining cellulitis/abscess.  She is afebrile however was found to be hypoxic with good [place] at 88% on room air.  Patient placed on 2 L via nasal cannula with improvement.  She was scheduled for IR drain at 9 AM.  Given this I discussed the patient with Dr. Dema Severin (general surgery) who recommends sepsis work-up and CT scan.  He has evaluated the patient and will help coordinate care.  She will likely need admission for antibiotics and additional IR drain placement.  At shift change care was transferred to [facility], PA-C who will follow pending studies, re-evaulate and determine disposition.    The patient was discussed with and seen by Dr. Randal Buba who agrees with the treatment plan.  Final  Clinical Impression(s) / ED Diagnoses Final diagnoses:  RLQ abdominal pain  Other appendicitis  Abdominal wall abscess    Rx / DC Orders ED Discharge Orders    None       Anavictoria Wilk, Gwenlyn Perking 08/05/19 0703    Palumbo, April, MD 08/05/19 [phone]

## 2019-08-05 NOTE — Progress Notes (Signed)
Pharmacy Antibiotic Note  Ashley Dunn is a 86 y.o. female admitted on 08/05/2019 with sepsis.  Pharmacy has been consulted for Cefepime dosing. Recent proteus appendiceal abscess with drain placement. WBC 12.3. Noted bump in Scr.   Plan: Cefepime 2g IV q24h Trend WBC, temp, renal function  F/U infectious work-up Drug levels as indicated  ADDENDUM:  Pharmacy consulted to transition cefepime/Flagyl to Zosyn.  Plan: D/c cefepime/Flagyl >> Zosyn 3.375g IV q8h (4h infusion) Monitor clinical progress, c/s, renal function F/u de-escalation plan/LOT  Height: 5' (152.4 cm) Weight: 75 kg (165 lb 5.5 oz) IBW/kg (Calculated) : 45.5  Temp (24hrs), Avg:98.4 F (36.9 C), Min:98.4 F (36.9 C), Max:98.4 F (36.9 C)  Recent Labs  Lab 08/05/19 0536 08/05/19 0833  WBC 12.3*  --   CREATININE 1.36*  --   LATICACIDVEN  --  1.3    Estimated Creatinine Clearance: 24.4 mL/min (A) (by C-G formula based on SCr of 1.36 mg/dL (H)).    No Known Allergies  Arturo Morton, PharmD, BCPS Please check AMION for all [name] contact numbers Clinical Pharmacist 08/05/2019 10:08 AM

## 2019-08-05 NOTE — Progress Notes (Signed)
ANTICOAGULATION CONSULT NOTE - Follow Up Consult  Pharmacy Consult for Heparin Indication: atrial fibrillation  No Known Allergies  Patient Measurements: Height: 5' (152.4 cm) Weight: 68.2 kg (150 lb 6.4 oz) IBW/kg (Calculated) : 45.5  Vital Signs: Temp: 98 F (36.7 C) (04/12 1959) Temp Source: Oral (04/12 1959) BP: 129/50 (04/12 1959) Pulse Rate: 64 (04/12 1959)  Labs: Recent Labs    08/05/19 0536 08/05/19 4287 08/05/19 2235  HGB 14.8  --   --   HCT 44.2  --   --   PLT 299  --   --   APTT  --  29  --   LABPROT  --  13.4  --   INR  --  1.0  --   HEPARINUNFRC  --   --  <0.10*  CREATININE 1.36*  --   --     Estimated Creatinine Clearance: 23.2 mL/min (A) (by C-G formula based on SCr of 1.36 mg/dL (H)).  Assessment: 86 y.o. female with H/O Afib, Coumadin on hold and INR subtherapeutic, for heparin Goal of Therapy:  Heparin level 0.3-0.7 units/ml Monitor platelets by anticoagulation protocol: Yes   Plan:  Increase Heparin 1100 units/hr Follow-up am labs.   Caryl Pina 08/05/2019,11:41 PM

## 2019-08-05 NOTE — ED Notes (Signed)
Pt transported to IR 

## 2019-08-05 NOTE — ED Notes (Signed)
Lunch tray ordered 

## 2019-08-05 NOTE — Progress Notes (Signed)
CC: RLQ Pain/abscess  Requesting provider: Abigail Butts, PA-C  HPI: Ashley Dunn is an 86 y.o. female hx afib (on warfarin, off since Wednesday), CHF, whom has hx of perforated appendicitis and drain placement 05/2019. She has undergone drain exchanges and eventual removal of this. Abscess recurred and this was replaced. Most recently on 07/30/19. Since placement has had virtually no drainage from tube but increasing swelling and pain at RLQ where prior drain was. She was scheduled for repeat imaging/procedure with IR today at 10am but presented to ED. Denies fever/chills/nausea/vomiting. Pain is RLQ, sharp, persistent and nonradiating. She has not had CT yet.  Past Medical History:  Diagnosis Date  . Arthritis   . Atrial fibrillation ([place])   . Cataract   . CHF (congestive heart failure) ([place])   . Hypertension   . Osteoporosis   . Thyroid disease     Past Surgical History:  Procedure Laterality Date  . CESAREAN SECTION    . EYE SURGERY    . FRACTURE SURGERY    . IR CATHETER TUBE CHANGE  06/12/2019  . IR RADIOLOGIST EVAL & MGMT  06/11/2019  . IR RADIOLOGIST EVAL & MGMT  06/25/2019  . IR RADIOLOGIST EVAL & MGMT  07/30/2019    Family History  Problem Relation Age of Onset  . Stroke Mother   . Cancer Father   . Hyperlipidemia Brother   . Healthy Daughter   . Healthy Son   . Heart disease Maternal Grandmother   . Heart disease Maternal Grandfather     Social:  reports that she has never smoked. She has never used smokeless tobacco. She reports that she does not drink alcohol or use drugs.  Allergies: No Known Allergies  Medications: I have reviewed the patient's current medications.  Results for orders placed or performed during the hospital encounter of 08/05/19 (from the past 48 hour(s))  Lipase, blood     Status: None   Collection Time: 08/05/19  5:36 AM  Result Value Ref Range   Lipase 28 11 - 51 U/L    Comment: Performed at [facility], [place] [address],  [place], [place] [address]  Comprehensive metabolic panel     Status: Abnormal   Collection Time: 08/05/19  5:36 AM  Result Value Ref Range   Sodium 139 135 - 145 mmol/L   Potassium 2.7 (LL) 3.5 - 5.1 mmol/L    Comment: CRITICAL RESULT CALLED TO, READ BACK BY AND VERIFIED WITH: OSORIO B,RN 08/05/19 0632 WAYK    Chloride 89 (L) 98 - 111 mmol/L   CO2 36 (H) 22 - 32 mmol/L   Glucose, Bld 123 (H) 70 - 99 mg/dL    Comment: Glucose reference range applies only to samples taken after fasting for at least 8 hours.   BUN 28 (H) 8 - 23 mg/dL   Creatinine, Ser 1.36 (H) 0.44 - 1.00 mg/dL   Calcium 9.1 8.9 - 10.3 mg/dL   Total Protein 6.7 6.5 - 8.1 g/dL   Albumin 2.4 (L) 3.5 - 5.0 g/dL   AST 24 15 - 41 U/L   ALT 18 0 - 44 U/L   Alkaline Phosphatase 66 38 - 126 U/L   Total Bilirubin 1.2 0.3 - 1.2 mg/dL   GFR calc non Af Amer 34 (L) >60 mL/min   GFR calc Af Amer 39 (L) >60 mL/min   Anion gap 14 5 - 15    Comment: Performed at [facility] [address], [place], [name]  [address]  CBC     Status: Abnormal   Collection Time: 08/05/19  5:36 AM  Result Value Ref Range   WBC 12.3 (H) 4.0 - 10.5 K/uL   RBC 4.80 3.87 - 5.11 MIL/uL   Hemoglobin 14.8 12.0 - 15.0 g/dL   HCT 44.2 36.0 - 46.0 %   MCV 92.1 80.0 - 100.0 fL   MCH 30.8 26.0 - 34.0 pg   MCHC 33.5 30.0 - 36.0 g/dL   RDW 16.4 (H) 11.5 - 15.5 %   Platelets 299 150 - 400 K/uL   nRBC 0.0 0.0 - 0.2 %    Comment: Performed at [facility], [place] [address], [place], [place] [address]  APTT     Status: None   Collection Time: 08/05/19  6:33 AM  Result Value Ref Range   aPTT 29 24 - 36 seconds    Comment: Performed at [facility] [address] [place], [place] [address]  Protime-INR     Status: None   Collection Time: 08/05/19  6:33 AM  Result Value Ref Range   Prothrombin Time 13.4 11.4 - 15.2 seconds   INR 1.0 0.8 - 1.2    Comment: (NOTE) INR goal varies based on device and disease states. Performed at [facility], [place] [address] [place], [name] [address]   Lactic acid, plasma     Status: None   Collection Time: 08/05/19  8:33 AM  Result Value Ref Range   Lactic Acid, Venous 1.3 0.5 - 1.9 mmol/L    Comment: Performed at [facility] [address] [place], [place] [address]    No results found.  ROS - all of the below systems have been reviewed with the patient and positives are indicated with bold text General: chills, fever or night sweats Eyes: blurry vision or double vision ENT: epistaxis or sore throat Allergy/Immunology: itchy/watery eyes or nasal congestion Hematologic/Lymphatic: bleeding problems, blood clots or swollen lymph nodes Endocrine: temperature intolerance or unexpected weight changes Breast: new or changing breast lumps or nipple discharge Resp: cough, shortness of breath, or wheezing CV: chest pain or dyspnea on exertion GI: as per HPI GU: dysuria, trouble voiding, or hematuria MSK: joint pain or joint stiffness Neuro: TIA or stroke symptoms Derm: pruritus and skin lesion changes Psych: anxiety and depression  PE Blood pressure 124/70, pulse 81, temperature 98.4 F (36.9 C), temperature source Oral, resp. rate 18, height 5' (1.524 m), weight 75 kg, SpO2 92 %. Constitutional: NAD; conversant; no deformities Eyes: Moist conjunctiva; no lid lag; anicteric; pupils equal and round Neck: Trachea midline; no thyromegaly Lungs: Normal respiratory effort; no tactile fremitus CV: RRR; no palpable thrills; no pitting edema GI: Abd soft focally ttp in RLQ where there is large firm likely abscess collection with overlying erythema; nondistended; no rebound/guarding; IR drain in place. No palpable hepatosplenomegaly MSK: Normal range of motion of extremities; no clubbing/cyanosis Psychiatric: Appropriate affect; alert and oriented x3 Lymphatic: No palpable cervical or axillary lymphadenopathy  Results for orders placed or performed during the hospital encounter  of 08/05/19 (from the past 48 hour(s))  Lipase, blood     Status: None   Collection Time: 08/05/19  5:36 AM  Result Value Ref Range   Lipase 28 11 - 51 U/L    Comment: Performed at [facility], [place] [address], [place], [place] [address]  Comprehensive metabolic panel     Status: Abnormal   Collection Time: 08/05/19  5:36 AM  Result Value Ref Range   Sodium 139 135 - 145 mmol/L   Potassium 2.7 (LL) 3.5 - 5.1 mmol/L    Comment: CRITICAL RESULT CALLED TO, READ BACK BY AND VERIFIED WITH: OSORIO B,RN 08/05/19 0632 WAYK    Chloride 89 (L) 98 - 111 mmol/L   CO2 36 (H) 22 - 32 mmol/L   Glucose, Bld 123 (H) 70 - 99 mg/dL    Comment: Glucose reference range applies only to samples taken after fasting for at least 8 hours.   BUN 28 (H) 8 - 23 mg/dL   Creatinine, Ser 1.36 (H) 0.44 - 1.00 mg/dL   Calcium 9.1 8.9 - 10.3 mg/dL   Total Protein 6.7 6.5 - 8.1 g/dL   Albumin 2.4 (L) 3.5 - 5.0 g/dL   AST 24 15 - 41 U/L   ALT 18 0 - 44 U/L   Alkaline Phosphatase 66 38 - 126 U/L   Total Bilirubin 1.2 0.3 - 1.2 mg/dL   GFR calc non Af Amer 34 (L) >60 mL/min   GFR calc Af Amer 39 (L) >60 mL/min   Anion gap 14 5 - 15    Comment: Performed at [facility] [address], [place], [place] [address]  CBC     Status: Abnormal   Collection Time: 08/05/19  5:36 AM  Result Value Ref Range   WBC 12.3 (H) 4.0 - 10.5 K/uL   RBC 4.80 3.87 - 5.11 MIL/uL   Hemoglobin 14.8 12.0 - 15.0 g/dL   HCT 44.2 36.0 - 46.0 %   MCV 92.1 80.0 - 100.0 fL   MCH 30.8 26.0 - 34.0 pg   MCHC 33.5 30.0 - 36.0 g/dL   RDW 16.4 (H) 11.5 - 15.5 %   Platelets 299 150 - 400 K/uL   nRBC 0.0 0.0 - 0.2 %    Comment: Performed at [facility], [place] [address] [place], [place] [address]  APTT     Status: None   Collection Time: 08/05/19  6:33 AM  Result Value Ref Range   aPTT 29 24 - 36 seconds    Comment: Performed at [facility] [address] [place], [place] [address]  Protime-INR     Status: None    Collection Time: 08/05/19  6:33 AM  Result Value Ref Range   Prothrombin Time 13.4 11.4 - 15.2 seconds   INR 1.0 0.8 - 1.2    Comment: (NOTE) INR goal varies based on device and disease states. Performed at [facility], [place] [address], [place], [name] [address]   Lactic acid, plasma     Status: None   Collection Time: 08/05/19  8:33 AM  Result Value Ref Range   Lactic Acid, Venous 1.3 0.5 - 1.9 mmol/L    Comment: Performed at [facility] [address], [place], [place] [address]    No results found.   A/P: Keshia Weare is an 86 y.o. female with hx perforated appendicitis/abscess managed with IR drains for last 2 months - now with likely undrained collection  -Scheduled for IR today at 10am which would be ideal to proceed with assuming CT shows what we expect -NPO, MIVF, IV abx - Zosyn -Medicine consultation and we will follow with you  Sharon Mt. Dema Severin, M.D. [facility] Use [email or web address] to contact on call provider

## 2019-08-05 NOTE — H&P (Addendum)
Chief Complaint: Patient was seen in consultation today for intra-abdominal fluid collection  Referring Physician(s): Gross,Steven  Supervising Physician: Daryll Brod  Patient Status: [name] - Out-pt  History of Present Illness: Ashley Dunn is a 86 y.o. female with past medical history of arthritis, CHF, HTN, a fib on coumadin who presented to [facility] ED 05/30/19 with perforated appendicitis with abscess.  She underwent RLQ drain placement and was discharged home on antibiotics with drain in place. After discharge she was seen in IR drain clinic 06/25/19 at which time there was a small remaining fluid collection with no fistulous connection.  The drain was left in place.  She presented to her surgeon's office mid March for routine follow-up and the drain was pulled at that time.  Shortly after her drain was pulled, she noticed palpable area of hardening under the skin which ruptured with significant drainage. She underwent repeat drain placement 07/17/19 after CT from surgeon's office on 07/12/19 showed recurrence of collection.  Patient had been doing well at home.  She presented to IR clinic for routine follow-up 07/30/19 at which time she reported decreased output from her drain with residual large collection noted on CT imaging.  She was scheduled for drain exchange/repositioning in IR today, however overnight had sharp  RLQ pain and began to have bloody, purulent drainage from her prior drain site directly adjacent to her current RLQ drain.  She continues to have minimal output from the RLQ drain currently in place.  IR consulted for drain exchange/reposition as previously scheduled.   Patient assessed in ED today. She is her usual pleasant self. She confirms the history above stating "this is the second time this has happened to me."  Her RLQ drain is in place.  Insertion site is erythematous with purulent drainage. She has a trace amount of output in her collection bag.  She has held her coumadin  since Wednesday. She has been NPO today.   Past Medical History:  Diagnosis Date  . Arthritis   . Atrial fibrillation ([place])   . Cataract   . CHF (congestive heart failure) ([place])   . Hypertension   . Osteoporosis   . Thyroid disease     Past Surgical History:  Procedure Laterality Date  . CESAREAN SECTION    . EYE SURGERY    . FRACTURE SURGERY    . IR CATHETER TUBE CHANGE  06/12/2019  . IR RADIOLOGIST EVAL & MGMT  06/11/2019  . IR RADIOLOGIST EVAL & MGMT  06/25/2019  . IR RADIOLOGIST EVAL & MGMT  07/30/2019    Allergies: Patient has no known allergies.  Medications: Prior to Admission medications   Medication Sig Start Date End Date Taking? Authorizing Provider  amoxicillin-clavulanate (AUGMENTIN) 875-125 MG tablet Take 1 tablet by mouth 2 (two) times daily. 07/11/19  Yes [provider]  diltiazem (CARDIZEM CD) 240 MG 24 hr capsule Take 240 mg by mouth 2 (two) times daily.  12/28/18  Yes [provider]  metoprolol succinate (TOPROL-XL) 50 MG 24 hr tablet Take 50 mg by mouth in the morning and at bedtime.  01/21/19  Yes [provider]  thyroid (ARMOUR) 180 MG tablet Take 180 mg by mouth daily.   Yes [provider]  torsemide (DEMADEX) 20 MG tablet Take 20 mg by mouth 2 (two) times daily.  01/15/19  Yes [provider]  metoprolol succinate (TOPROL-XL) 25 MG 24 hr tablet Take 1 tablet (25 mg total) by mouth daily. Patient not taking: Reported on  07/16/2019 05/14/19   Miquel Dunn, NP  thyroid [facility] THYROID) 120 MG tablet Take 1 tablet (120 mg total) by mouth daily before breakfast. Patient not taking: Reported on 07/16/2019 05/14/19   Forrest Moron, MD  thyroid [facility] THYROID) 60 MG tablet Take 60mg  by mouth daily and add to the 120mg  Patient not taking: Reported on 07/16/2019 05/29/19   Forrest Moron, MD  warfarin (COUMADIN) 5 MG tablet Take 2.5-5 mg by mouth See admin instructions. Take 2.5 mg by mouth on Mon, Wed, Fri and Sun  after supper. Take 5 MG Tuesday, Thursday and Saturday after supper. 12/28/18   [provider]     Family History  Problem Relation Age of Onset  . Stroke Mother   . Cancer Father   . Hyperlipidemia Brother   . Healthy Daughter   . Healthy Son   . Heart disease Maternal Grandmother   . Heart disease Maternal Grandfather     Social History   Socioeconomic History  . Marital status: Widowed    Spouse name: Not on file  . Number of children: Not on file  . Years of education: Not on file  . Highest education level: Not on file  Occupational History  . Not on file  Tobacco Use  . Smoking status: Never Smoker  . Smokeless tobacco: Never Used  Substance and Sexual Activity  . Alcohol use: Never  . Drug use: Never  . Sexual activity: Not Currently  Other Topics Concern  . Not on file  Social History Narrative   Lives at home with duaghter   Social Determinants of Health   Financial Resource Strain:   . Difficulty of Paying Living Expenses:   Food Insecurity:   . Worried About Charity fundraiser in the Last Year:   . Arboriculturist in the Last Year:   Transportation Needs:   . Film/video editor (Medical):   Marland Kitchen Lack of Transportation (Non-Medical):   Physical Activity:   . Days of Exercise per Week:   . Minutes of Exercise per Session:   Stress:   . Feeling of Stress :   Social Connections:   . Frequency of Communication with Friends and Family:   . Frequency of Social Gatherings with Friends and Family:   . Attends Religious Services:   . Active Member of Clubs or Organizations:   . Attends Archivist Meetings:   Marland Kitchen Marital Status:      Review of Systems: A 12 point ROS discussed and pertinent positives are indicated in the HPI above.  All other systems are negative.  Review of Systems  Constitutional: Negative for fatigue and fever.  Respiratory: Negative for cough and shortness of breath.   Cardiovascular: Negative for chest pain.    Gastrointestinal: Negative for abdominal distention, abdominal pain, diarrhea, nausea and vomiting.  Genitourinary: Negative for dysuria.  Musculoskeletal: Negative for back pain.  Psychiatric/Behavioral: Negative for behavioral problems and confusion.    Vital Signs: There were no vitals taken for this visit.  Physical Exam Vitals and nursing note reviewed.  Constitutional:      General: She is not in acute distress.    Appearance: Normal appearance. She is not ill-appearing.  HENT:     Mouth/Throat:     Mouth: Mucous membranes are moist.     Pharynx: Oropharynx is clear.  Cardiovascular:     Rate and Rhythm: Normal rate. Rhythm irregular.     Comments: Occasional extraneous beat Pulmonary:  Effort: Pulmonary effort is normal. No respiratory distress.     Breath sounds: Normal breath sounds.  Abdominal:     General: There is no distension.     Palpations: Abdomen is soft.     Tenderness: There is abdominal tenderness.     Comments: Prior drain site with active purulent, bloody drainage. Area erythematous.  Drain site red with purulent drainage. Minimal output in collection bag.  Cellulitis with possible abscess  Skin:    General: Skin is warm and dry.  Neurological:     General: No focal deficit present.     Mental Status: She is alert and oriented to person, place, and time.  Psychiatric:        Mood and Affect: Mood normal.        Behavior: Behavior normal.        Thought Content: Thought content normal.        Judgment: Judgment normal.      MD Evaluation Airway: WNL Heart: WNL Abdomen: WNL Chest/ Lungs: WNL ASA  Classification: 3 Mallampati/Airway Score: One   Imaging: CT ABDOMEN PELVIS WO CONTRAST  Result Date: 07/30/2019 CLINICAL DATA:  86 year old female with a history of appendicitis with abscess, initial imaging 05/29/2019. Percutaneous drainage performed 05/30/2019. Drain had been removed between 06/25/2019 and 07/12/2019, with recurrence of the  abscess on CT 07/12/2019. Drainage performed 07/17/2019 with placement of pigtail drainage catheter. EXAM: CT ABDOMEN AND PELVIS WITHOUT CONTRAST TECHNIQUE: Multidetector CT imaging of the abdomen and pelvis was performed following the standard protocol without IV contrast. COMPARISON:  Most recent 07/17/2019, 07/12/2019, most remote 05/29/2019 FINDINGS: Lower chest: Scarring/atelectasis in the lower lungs. Similar configuration of nodularity at the base of the right lung, unchanged diameter to the baseline CT of 05/29/2019 measuring 2.5 cm. Hepatobiliary: Unremarkable liver.  Unremarkable gallbladder Pancreas: Unremarkable pancreas Spleen: Unremarkable Adrenals/Urinary Tract: - Right adrenal gland:  Unremarkable - Left adrenal gland: Unremarkable. - Right kidney: No hydronephrosis, nephrolithiasis, inflammation, or ureteral dilation. Similar size of the right upper pole partially cystic kidney lesion which measures 3.3 cm on the current study. This lesion is incompletely characterized with the absence of contrast. - Left Kidney: No hydronephrosis, nephrolithiasis, inflammation, or ureteral dilation. Similar appearance of left-sided cystic lesions at the superior cortex and the medial cortex. - Urinary Bladder: Unremarkable. Stomach/Bowel: - Stomach: Unremarkable. - Small bowel: Unremarkable - Appendix: Pigtail drainage catheter again terminates within the right lower quadrant in the region of the abscess. This appears to be at the tip of the appendix with fluid-filled dilated appendix. Lateral to the indwelling drain is increasing size of a multi spatial abscess, crossing the abdominal wall and assess attaining towards the skin surface. Diameter on the axial measures 8.5 cm x 2.9 cm. - Colon: Colon relatively decompressed. No focal inflammatory changes. Diverticula are present. Vascular/Lymphatic: Aortic atherosclerosis. Small lymph nodes of the inguinal region. Small lymph nodes along the ileocolic nodal station.  The largest in the ileocolic nodal station is greater than the comparison, now 12 mm. Reproductive: Unremarkable uterus/adnexa Other: None Musculoskeletal: Multilevel degenerative changes. Similar appearance of compression fracture of T12 and L3 with no retropulsion of fracture fragments. Osteopenia. No bony canal narrowing. IMPRESSION: Unchanged position of pigtail drainage catheter within the right lower quadrant. The loop of the catheter terminates near the tip of the appendix. New multi spatial abscess lateral to the drainage catheter which necessitates towards the abdominal wall. Redemonstration of bilateral renal lesions, including complex 3.7 cm cyst of the right kidney cortex which  may incorporate a mural nodule. As was previously noted, MRI could be used to assess for any concerning solid components, if indicated. Aortic Atherosclerosis (ICD10-I70.0). Unchanged appearance of nodularity at the base of the right lung, potentially rounded atelectasis. Additional ancillary findings as above. Electronically Signed   By: Corrie Mckusick D.O.   On: 07/30/2019 13:50   CT ABDOMEN PELVIS W CONTRAST  Result Date: 08/05/2019 CLINICAL DATA:  86 year old female with history of abdominal pain. EXAM: CT ABDOMEN AND PELVIS WITH CONTRAST TECHNIQUE: Multidetector CT imaging of the abdomen and pelvis was performed using the standard protocol following bolus administration of intravenous contrast. CONTRAST:  27mL OMNIPAQUE IOHEXOL 300 MG/ML  SOLN COMPARISON:  CT the abdomen and pelvis 07/30/2019. FINDINGS: Lower chest: In the posterior aspect of the right lower lobe there is a pleural-based lesion measuring 2.8 x 1.0 cm (axial image 16 of series 5), which appears similar to the recent prior examination, as well as more remote prior study from 05/29/2019. Atherosclerotic calcifications in the thoracic aorta as well as the left circumflex and right coronary arteries. Mild cardiomegaly with biatrial dilatation. Hepatobiliary: No  suspicious cystic or solid hepatic lesions. No intra or extrahepatic biliary ductal dilatation. Gallbladder is normal in appearance. Pancreas: No pancreatic mass. No pancreatic ductal dilatation. No pancreatic or peripancreatic fluid collections or inflammatory changes. Spleen: Unremarkable. Adrenals/Urinary Tract: Multiple low-attenuation lesions are noted in both kidneys, compatible with simple cysts, largest of which is in the upper pole the right kidney measuring 3.3 cm in diameter. No suspicious renal lesions. No hydroureteronephrosis. Urinary bladder is normal in appearance. Bilateral adrenal glands are within normal limits. Stomach/Bowel: Normal appearance of the stomach. No pathologic dilatation of small bowel or colon. The appendix is dilated measuring up to 3 cm in diameter. Hip pigtail drainage catheter is in place within the dilated appendix. Extending from the dilated appendix through the right lateral abdominal wall musculature and to the skin surface there is a fistulous tract with fluid and gas, and surrounding rim enhancement. This is irregular in shape and therefore difficult to discretely measure, but appears grossly similar to the recent prior examination. Vascular/Lymphatic: Aortic atherosclerosis, without evidence of aneurysm or dissection in the abdominal or pelvic vasculature. No lymphadenopathy noted in the abdomen or pelvis. Reproductive: Uterus and ovaries are unremarkable in appearance. Other: No significant volume of ascites.  No pneumoperitoneum. Musculoskeletal: There are no aggressive appearing lytic or blastic lesions noted in the visualized portions of the skeleton. Chronic compression fractures of T12 and L3, most severe at T12 where there is 50% loss of anterior vertebral body height. IMPRESSION: 1. Pigtail drainage catheter appears located within a dilated appendix or large periappendiceal abscess, which is again associated with a fistulous tract which drains through the right  lateral abdominal wall musculature to the overlying skin surface. This overall appears very similar to the recent prior CT 07/30/2019. 2. Pleural-based nodule in the posterior aspect of the right lower lobe measuring 2.8 x 1.0 cm. This appears roughly stable in size dating back to 05/29/2019. The stability is reassuring, however, the possibility of neoplasm is not excluded. Close attention on follow-up imaging is recommended, with repeat noncontrast chest CT in 3-6 months to continue surveillance of this lesion. 3. Aortic atherosclerosis, in addition to at least 2 vessel coronary artery disease. 4. Cardiomegaly with biatrial dilatation. 5. Additional incidental findings, as above. Electronically Signed   By: Vinnie Langton M.D.   On: 08/05/2019 08:26   CT ABDOMEN PELVIS W CONTRAST  Result  Date: 07/12/2019 CLINICAL DATA:  Appendicitis. EXAM: CT ABDOMEN AND PELVIS WITH CONTRAST TECHNIQUE: Multidetector CT imaging of the abdomen and pelvis was performed using the standard protocol following bolus administration of intravenous contrast. CONTRAST:  28mL OMNIPAQUE IOHEXOL 300 MG/ML  SOLN COMPARISON:  June 25, 2019. FINDINGS: Lower chest: Stable pleural based consolidation is seen in right lower lobe. Otherwise visualized lung bases are unremarkable. Hepatobiliary: No focal liver abnormality is seen. No gallstones, gallbladder wall thickening, or biliary dilatation. Pancreas: Unremarkable. No pancreatic ductal dilatation or surrounding inflammatory changes. Spleen: Normal in size without focal abnormality. Adrenals/Urinary Tract: Adrenal glands appear normal. Bilateral renal cysts are noted. No hydronephrosis or renal obstruction is noted. No renal or ureteral calculi are noted. Urinary bladder is unremarkable. Stomach/Bowel: The stomach appears normal. There is no evidence of bowel obstruction or dilatation. Pigtail drainage catheter noted on prior exam has been removed. Recurrent abscess measuring 5.4 x 3.7 cm is  noted in the right lower quadrant of the abdomen. Inflammation is also seen in the subcutaneous tissues of the right lower quadrant with possible developing abscess measuring 2.9 x 2.7 cm. The appendix is significantly enlarged compared to prior exam consistent with inflammation. Vascular/Lymphatic: Aortic atherosclerosis. No enlarged abdominal or pelvic lymph nodes. Reproductive: Uterus and bilateral adnexa are unremarkable. Other: Small fat containing periumbilical hernia is noted. No ascites is noted. Musculoskeletal: Multilevel degenerative disc disease is noted in the lower thoracic and lumbar spine. No acute abnormality is noted. IMPRESSION: Pigtail drainage catheter noted on prior exam has been removed. Recurrent abscess is noted in the right lower quadrant of the abdomen measuring 5.4 x 3.7 cm. Inflammation is also seen in the subcutaneous tissues of the right lower quadrant with possible developing abscess measuring 2.9 x 2.7 cm. The appendix is significantly enlarged compared to prior exam consistent with inflammation. These results will be called to the ordering clinician or representative by the Radiologist Assistant, and communication documented in the PACS or zVision Dashboard. Aortic Atherosclerosis (ICD10-I70.0). Electronically Signed   By: Marijo Conception M.D.   On: 07/12/2019 12:45   CT IMAGE GUIDED DRAINAGE BY PERCUTANEOUS CATHETER  Result Date: 07/17/2019 CLINICAL DATA:  Recurrent periappendiceal abscess EXAM: CT GUIDED DRAINAGE OF PERIAPPENDICEAL ABSCESS ANESTHESIA/SEDATION: Intravenous Fentanyl 37mcg and Versed 1mg  were administered as conscious sedation during continuous monitoring of the patient's level of consciousness and physiological / cardiorespiratory status by the radiology RN, with a total moderate sedation time of 10 minutes. PROCEDURE: The procedure, risks, benefits, and alternatives were explained to the patient. Questions regarding the procedure were encouraged and answered.  The patient understands and consents to the procedure. Select axial scans through the lower abdomen were obtained. The loculated periappendiceal collection was localized, and showed no significant decrease in size since prior CT of 07/12/2019. An appropriate skin entry site was determined and marked. The operative field was prepped with chlorhexidinein a sterile fashion, and a sterile drape was applied covering the operative field. A sterile gown and sterile gloves were used for the procedure. Local anesthesia was provided with 1% Lidocaine. Under CT fluoroscopic guidance, 18 gauge needle advanced into the collection. Amplatz guidewire advanced easily. Tract dilated to facilitate placement of a 12 French pigtail drain catheter, formed centrally within the collection. 15 mL of bloody purulent material were aspirated, sent for Gram stain and culture. The catheter was secured externally with 0 Prolene suture and StatLock and placed to gravity drain bag. The patient tolerated the procedure well. COMPLICATIONS: None immediate FINDINGS: Persistent periappendiceal abscess.  12 French pigtail drain catheter placed as above. 15 mL purulent aspirate sent for Gram stain and culture. IMPRESSION: 1. Technically successful CT-guided periappendiceal abscess drain catheter placement. Electronically Signed   By: Lucrezia Europe M.D.   On: 07/17/2019 17:20   IR Radiologist Eval & Mgmt  Result Date: 07/30/2019 Please refer to notes tab for details about interventional procedure. (Op Note)   Labs:  CBC: Recent Labs    05/29/19 1102 06/02/19 0358 06/17/19 1050 06/17/19 1117 07/17/19 0917 08/05/19 0536  WBC   < > 9.6 9.2 8.7 10.6* 12.3*  HGB   < > 13.6 15.2* 15.7 14.9 14.8  HCT   < > 42.0 44.0* 47.2* 45.9 44.2  PLT  --  313  --  272 354 299   < > = values in this interval not displayed.    COAGS: Recent Labs    05/29/19 1715 05/30/19 0247 06/17/19 1524 07/15/19 1501 07/25/19 1113 08/05/19 0633  INR 1.5*   < >  2.3 1.5* 1.5* 1.0  APTT 32  --   --   --   --  29   < > = values in this interval not displayed.    BMP: Recent Labs    06/01/19 0327 06/02/19 0358 06/17/19 1112 08/05/19 0536  NA 141 141 142 139  K 3.8 3.3* 3.4* 2.7*  CL 100 96* 94* 89*  CO2 33* 34* 27 36*  GLUCOSE 114* 96 110* 123*  BUN 22 19 29  28*  CALCIUM 8.4* 9.1 9.0 9.1  CREATININE 1.19* 1.11* 1.51* 1.36*  GFRNONAA 40* 43* 30* 34*  GFRAA 46* 50* 35* 39*    LIVER FUNCTION TESTS: Recent Labs    02/06/19 1039 05/29/19 1223 08/05/19 0536  BILITOT 0.6 1.0 1.2  AST 32 23 24  ALT 19 18 18   ALKPHOS 92 74 66  PROT 7.2 7.3 6.7  ALBUMIN 4.1 2.6* 2.4*    TUMOR MARKERS: No results for input(s): AFPTM, CEA, CA199, CHROMGRNA in the last 8760 hours.  Assessment and Plan: Recurrent intra-abdominal fluid collection  History of appendicitis Patient with past medical history of appendicitis with perforation, recurrent fluid collections/abscesses who presents to ED today with abdominal pain and active purulent drainage.  She was scheduled for drain exchange/reposition in IR today.    Case reviewed by Dr. Annamaria Boots who approves patient for procedure.  Patient presents today in their usual state of health.  She has been NPO and is not currently on blood thinners as she has held her coumadin since Wednesday.  WBC 12.3 She is on IV Zosyn.  INR 1.0  Risks and benefits discussed with the patient including bleeding, infection, damage to adjacent structures, bowel perforation/fistula connection, and sepsis.  All of the patient's questions were answered, patient is agreeable to proceed. Consent signed and in chart.  Thank you for this interesting consult.  I greatly enjoyed meeting Ashley Dunn and look forward to participating in their care.  A copy of this report was sent to the requesting provider on this date.  Electronically Signed: Docia Barrier, PA 08/05/2019, 10:22 AM   I spent a total of  30 Minutes   in face  to face in clinical consultation, greater than 50% of which was counseling/coordinating care for intra-abdominal fluid collection.

## 2019-08-06 DIAGNOSIS — Z8719 Personal history of other diseases of the digestive system: Secondary | ICD-10-CM

## 2019-08-06 DIAGNOSIS — I130 Hypertensive heart and chronic kidney disease with heart failure and stage 1 through stage 4 chronic kidney disease, or unspecified chronic kidney disease: Secondary | ICD-10-CM

## 2019-08-06 DIAGNOSIS — N1832 Chronic kidney disease, stage 3b: Secondary | ICD-10-CM

## 2019-08-06 DIAGNOSIS — K651 Peritoneal abscess: Secondary | ICD-10-CM

## 2019-08-06 LAB — BASIC METABOLIC PANEL
Anion gap: 11 (ref 5–15)
BUN: 14 mg/dL (ref 8–23)
CO2: 29 mmol/L (ref 22–32)
Calcium: 8.3 mg/dL — ABNORMAL LOW (ref 8.9–10.3)
Chloride: 101 mmol/L (ref 98–111)
Creatinine, Ser: 0.99 mg/dL (ref 0.44–1.00)
GFR calc Af Amer: 58 mL/min — ABNORMAL LOW (ref >60)
GFR calc non Af Amer: 50 mL/min — ABNORMAL LOW (ref >60)
Glucose, Bld: 94 mg/dL (ref 70–99)
Potassium: 3.6 mmol/L (ref 3.5–5.1)
Sodium: 141 mmol/L (ref 135–145)

## 2019-08-06 LAB — CBC WITH DIFFERENTIAL/PLATELET
Abs Immature Granulocytes: 0.1 10*3/uL — ABNORMAL HIGH (ref 0.00–0.07)
Basophils Absolute: 0.1 10*3/uL (ref 0.0–0.1)
Basophils Relative: 1 %
Eosinophils Absolute: 0.2 10*3/uL (ref 0.0–0.5)
Eosinophils Relative: 2 %
HCT: 40.2 % (ref 36.0–46.0)
Hemoglobin: 13.1 g/dL (ref 12.0–15.0)
Immature Granulocytes: 1 %
Lymphocytes Relative: 13 %
Lymphs Abs: 1.4 10*3/uL (ref 0.7–4.0)
MCH: 30.3 pg (ref 26.0–34.0)
MCHC: 32.6 g/dL (ref 30.0–36.0)
MCV: 93.1 fL (ref 80.0–100.0)
Monocytes Absolute: 1.2 10*3/uL — ABNORMAL HIGH (ref 0.1–1.0)
Monocytes Relative: 11 %
Neutro Abs: 7.7 10*3/uL (ref 1.7–7.7)
Neutrophils Relative %: 72 %
Platelets: 258 10*3/uL (ref 150–400)
RBC: 4.32 MIL/uL (ref 3.87–5.11)
RDW: 16.9 % — ABNORMAL HIGH (ref 11.5–15.5)
WBC: 10.7 10*3/uL — ABNORMAL HIGH (ref 4.0–10.5)
nRBC: 0 % (ref 0.0–0.2)

## 2019-08-06 LAB — HEPARIN LEVEL (UNFRACTIONATED)
Heparin Unfractionated: 0.32 IU/mL (ref 0.30–0.70)
Heparin Unfractionated: 0.36 IU/mL (ref 0.30–0.70)

## 2019-08-06 LAB — SURGICAL PCR SCREEN
MRSA, PCR: NEGATIVE
Staphylococcus aureus: POSITIVE — AB

## 2019-08-06 LAB — PROTIME-INR
INR: 1.1 (ref 0.8–1.2)
Prothrombin Time: 14.4 seconds (ref 11.4–15.2)

## 2019-08-06 MED ORDER — BENZOCAINE 10 % MT GEL
Freq: Three times a day (TID) | OROMUCOSAL | Status: DC | PRN
  Filled 2019-08-06: qty 9

## 2019-08-06 MED ORDER — HEPARIN (PORCINE) 25000 UT/250ML-% IV SOLN
1100.0000 [IU]/h | INTRAVENOUS | Status: DC
  Administered 2019-08-06: 1100 [IU]/h via INTRAVENOUS
  Filled 2019-08-06: qty 250

## 2019-08-06 MED ORDER — CHLORHEXIDINE GLUCONATE CLOTH 2 % EX PADS
6.0000 | MEDICATED_PAD | Freq: Every day | CUTANEOUS | Status: DC
  Administered 2019-08-07 (×2): 6 via TOPICAL

## 2019-08-06 NOTE — Progress Notes (Addendum)
Referring Physician(s): Dr. Lyman Bishop  Supervising Physician: Markus Daft  Patient Status:  [facility] - In-pt  Chief Complaint:  Perforated appendix with periappendiceal fluid collection.  Subjective:  86 y.o. female inpatient. History of a fib on coumadin presented to the ED on 2.4.21 with a perforated appendicits. IR placed an abscess drain to the perforated periappendical fluid collection on 2.4.21. Surgery removed her drain in clinic on 3.19.21 the drain was replaced on 3.24.21 after noticeable recaccumalation and upsized on 4.12.21. per notes from surgery dated 4.13.21 discussion to remove appendix while patient is admitted ongoing. Patient alert and laying in bed, calm and comfortable. Denies any fevers, headache, chest pain, SOB, cough, abdominal pain, nausea, vomiting or bleeding.    Allergies: Patient has no known allergies.  Medications: Prior to Admission medications   Medication Sig Start Date End Date Taking? Authorizing Provider  diltiazem (CARDIZEM CD) 240 MG 24 hr capsule Take 240 mg by mouth 2 (two) times daily.  12/28/18  Yes [provider]  metoprolol succinate (TOPROL-XL) 50 MG 24 hr tablet Take 1 tablet (50 mg total) by mouth in the morning and at bedtime. 07/25/19  Yes Patwardhan, Reynold Bowen, MD  thyroid (ARMOUR THYROID) 120 MG tablet Take 1 tablet (120 mg total) by mouth daily before breakfast. 08/02/19  Yes Stallings, Zoe A, MD  thyroid (ARMOUR THYROID) 30 MG tablet Take 1 tablet (30 mg total) by mouth daily before breakfast. 08/02/19  Yes Stallings, Zoe A, MD  torsemide (DEMADEX) 20 MG tablet Take 20 mg by mouth 2 (two) times daily.  01/15/19  Yes [provider]  warfarin (COUMADIN) 5 MG tablet Take 2.5-5 mg by mouth See admin instructions. Take 2.5 mg by mouth on Mon, Wed, Fri and Sun after supper. Take 5 MG Tuesday, Thursday and Saturday after supper. 12/28/18  Yes [provider]     Vital Signs: BP (!) 109/56 (BP Location: Left Arm)   Pulse  75   Temp 97.9 F (36.6 C) (Oral)   Resp 18   Ht 5' (1.524 m)   Wt 150 lb 6.4 oz (68.2 kg)   SpO2 96%   BMI 29.37 kg/m   Physical Exam Vitals and nursing note reviewed.  Constitutional:      Appearance: She is well-developed.  HENT:     Head: Normocephalic and atraumatic.  Eyes:     Conjunctiva/sclera: Conjunctivae normal.  Pulmonary:     Effort: Pulmonary effort is normal.  Abdominal:     Palpations: Abdomen is soft.     Comments: Positive RLQ drain  to suction.  Site is unremarkable with no erythema, edema, tenderness, bleeding minimal serosanguinous drainage noted at exit site. Suture and stat lock in place. Dressing is clean dry.,t. 3 ml of  serosanguinous colored fluid noted in bulb suction device. Drain is able to be flushed easily.    Musculoskeletal:     Cervical back: Normal range of motion.  Neurological:     Mental Status: She is alert and oriented to person, place, and time.     Imaging: CT ABDOMEN PELVIS W CONTRAST  Result Date: 08/05/2019 CLINICAL DATA:  86 year old female with history of abdominal pain. EXAM: CT ABDOMEN AND PELVIS WITH CONTRAST TECHNIQUE: Multidetector CT imaging of the abdomen and pelvis was performed using the standard protocol following bolus administration of intravenous contrast. CONTRAST:  71mL OMNIPAQUE IOHEXOL 300 MG/ML  SOLN COMPARISON:  CT the abdomen and pelvis 07/30/2019. FINDINGS: Lower chest: In the posterior aspect of the  right lower lobe there is a pleural-based lesion measuring 2.8 x 1.0 cm (axial image 16 of series 5), which appears similar to the recent prior examination, as well as more remote prior study from 05/29/2019. Atherosclerotic calcifications in the thoracic aorta as well as the left circumflex and right coronary arteries. Mild cardiomegaly with biatrial dilatation. Hepatobiliary: No suspicious cystic or solid hepatic lesions. No intra or extrahepatic biliary ductal dilatation. Gallbladder is normal in appearance.  Pancreas: No pancreatic mass. No pancreatic ductal dilatation. No pancreatic or peripancreatic fluid collections or inflammatory changes. Spleen: Unremarkable. Adrenals/Urinary Tract: Multiple low-attenuation lesions are noted in both kidneys, compatible with simple cysts, largest of which is in the upper pole the right kidney measuring 3.3 cm in diameter. No suspicious renal lesions. No hydroureteronephrosis. Urinary bladder is normal in appearance. Bilateral adrenal glands are within normal limits. Stomach/Bowel: Normal appearance of the stomach. No pathologic dilatation of small bowel or colon. The appendix is dilated measuring up to 3 cm in diameter. Hip pigtail drainage catheter is in place within the dilated appendix. Extending from the dilated appendix through the right lateral abdominal wall musculature and to the skin surface there is a fistulous tract with fluid and gas, and surrounding rim enhancement. This is irregular in shape and therefore difficult to discretely measure, but appears grossly similar to the recent prior examination. Vascular/Lymphatic: Aortic atherosclerosis, without evidence of aneurysm or dissection in the abdominal or pelvic vasculature. No lymphadenopathy noted in the abdomen or pelvis. Reproductive: Uterus and ovaries are unremarkable in appearance. Other: No significant volume of ascites.  No pneumoperitoneum. Musculoskeletal: There are no aggressive appearing lytic or blastic lesions noted in the visualized portions of the skeleton. Chronic compression fractures of T12 and L3, most severe at T12 where there is 50% loss of anterior vertebral body height. IMPRESSION: 1. Pigtail drainage catheter appears located within a dilated appendix or large periappendiceal abscess, which is again associated with a fistulous tract which drains through the right lateral abdominal wall musculature to the overlying skin surface. This overall appears very similar to the recent prior CT 07/30/2019.  2. Pleural-based nodule in the posterior aspect of the right lower lobe measuring 2.8 x 1.0 cm. This appears roughly stable in size dating back to 05/29/2019. The stability is reassuring, however, the possibility of neoplasm is not excluded. Close attention on follow-up imaging is recommended, with repeat noncontrast chest CT in 3-6 months to continue surveillance of this lesion. 3. Aortic atherosclerosis, in addition to at least 2 vessel coronary artery disease. 4. Cardiomegaly with biatrial dilatation. 5. Additional incidental findings, as above. Electronically Signed   By: Vinnie Langton M.D.   On: 08/05/2019 08:26   IR Catheter Tube Change  Result Date: 08/05/2019 INDICATION: Perforated appendicitis, persistent abscess despite 2 months of drain management EXAM: FLUOROSCOPIC RIGHT LOWER QUADRANT DRAIN EXCHANGE AND UP SIZE MEDICATIONS: The patient is currently admitted to the hospital and receiving intravenous antibiotics. The antibiotics were administered within an appropriate time frame prior to the initiation of the procedure. ANESTHESIA/SEDATION: Fentanyl 50 mcg IV; Versed 0 mg IV Moderate Sedation Time:  0 The patient was continuously monitored during the procedure by the interventional radiology nurse under my direct supervision. COMPLICATIONS: None immediate. PROCEDURE: Informed written consent was obtained from the patient after a thorough discussion of the procedural risks, benefits and alternatives. All questions were addressed. Maximal Sterile Barrier Technique was utilized including caps, mask, sterile gowns, sterile gloves, sterile drape, hand hygiene and skin antiseptic. A timeout was performed  prior to the initiation of the procedure. Previous CT reviewed. Under sterile conditions and local anesthesia, the existing 12 French drain was removed over an Amplatz guidewire. Fourteen [place] drain advanced. Retention loop formed the abscess cavity. Contrast injection confirms placement of the drain  as well as communication to the adjacent cutaneous fistula tract from a prior drain. Syringe aspiration yielded 30 cc mucinous fecal contaminated thick abscess fluid. Sample sent for culture. Abscess irrigated with 60 cc saline and connected to external suction bulb. Drain secured with Prolene suture and sterile dressing. Overall patient tolerated the procedure well. IMPRESSION: Successful fluoroscopic upsize of the right lower quadrant appendix abscess drain as above. Electronically Signed   By: Jerilynn Mages.  Shick M.D.   On: 08/05/2019 11:52    Labs:  CBC: Recent Labs    06/17/19 1117 07/17/19 0917 08/05/19 0536 08/06/19 0833  WBC 8.7 10.6* 12.3* 10.7*  HGB 15.7 14.9 14.8 13.1  HCT 47.2* 45.9 44.2 40.2  PLT 272 354 299 258    COAGS: Recent Labs    05/29/19 1715 05/30/19 0247 07/15/19 1501 07/25/19 1113 08/05/19 0633 08/06/19 0833  INR 1.5*   < > 1.5* 1.5* 1.0 1.1  APTT 32  --   --   --  29  --    < > = values in this interval not displayed.    BMP: Recent Labs    06/02/19 0358 06/17/19 1112 08/05/19 0536 08/06/19 0833  NA 141 142 139 141  K 3.3* 3.4* 2.7* 3.6  CL 96* 94* 89* 101  CO2 34* 27 36* 29  GLUCOSE 96 110* 123* 94  BUN 19 29 28* 14  CALCIUM 9.1 9.0 9.1 8.3*  CREATININE 1.11* 1.51* 1.36* 0.99  GFRNONAA 43* 30* 34* 50*  GFRAA 50* 35* 39* 58*    LIVER FUNCTION TESTS: Recent Labs    02/06/19 1039 05/29/19 1223 08/05/19 0536  BILITOT 0.6 1.0 1.2  AST 32 23 24  ALT 19 18 18   ALKPHOS 92 74 66  PROT 7.2 7.3 6.7  ALBUMIN 4.1 2.6* 2.4*    Assessment and Plan: 86 y.o, female inpatient. History of a fib on coumadin presented to the ED on 2.4.21 with a perforated appendicits. IR placed an abscess drain to the perforated periappendical fluid collection on 2.4.21. Surgery removed her drain in clinic on 3.19.21 the drain was replaced on 3.24.21 after noticeable recaccumalation and upsized on 4.12.21. per notes from surgery dated 4.13.21 discussion to remove appendix  while patient is admitted ongoing.  Per Epic output is:  46ml,   Pertinent Imaging None since drain exchange and upsize   Pertinent IR History 4.12.21 - RLQ abscess drain exchange and upsize 3.24.21 - Replacement RLQ abscess drain  2.19.21 - Exchange and reposition of RLQ periappendiceal abscess drain 2.16.21 - abscessogram 2.4.21 - Placement RLQ abscess drain to perforated appendix  Pertinent Allergies NKDA  WBC is 10.7. All labs and medications are within acceptable parameters.  Patient is afebrile.  Recommend team continue with flushing TID, output recording q shift and dressing changes as needed. Would consider additional imaging when output is less than 10 ml for 24 hours not including flush material.     Electronically Signed: Avel Peace, NP 08/06/2019, 2:08 PM   I spent a total of 15 Minutes at the the patient's bedside AND on the patient's hospital floor or unit, greater than 50% of which was counseling/coordinating care for periappendiceal abscess drain

## 2019-08-06 NOTE — Progress Notes (Signed)
Patient ID: Ashley Dunn, female   DOB: [birth year], 86 y.o.   MRN: [id number]       Subjective: Patient had a lot of pain with drain upsize yesterday.  No nausea.    ROS: See above, otherwise other systems negative  Objective: Vital signs in last 24 hours: Temp:  [97.9 F (36.6 C)-98 F (36.7 C)] 97.9 F (36.6 C) (04/13 0531) Pulse Rate:  [61-93] 75 (04/13 0531) Resp:  [15-24] 18 (04/13 0531) BP: (102-129)/(50-78) 109/56 (04/13 0531) SpO2:  [93 %-100 %] 96 % (04/13 0531) Weight:  [68.2 kg] 68.2 kg (04/12 1959) Last BM Date: 08/05/19  Intake/Output from previous day: 04/12 0701 - 04/13 0700 In: 1444.9 [I.V.:112.7; IV Piggyback:1307.1] Out: 100 [Urine:100] Intake/Output this shift: No intake/output data recorded.  PE: Heart: irregular, but rate controlled Lungs: CTAB Abd: soft, tender around drain.  Small area of skin necrosis next to drain likely where fistula is trying to open at the skin level.  Old bloody, purulent drainage noted on drain gauze.  JP drain with minimal serosang output.  Lab Results:  Recent Labs    08/05/19 0536  WBC 12.3*  HGB 14.8  HCT 44.2  PLT 299   BMET Recent Labs    08/05/19 0536  NA 139  K 2.7*  CL 89*  CO2 36*  GLUCOSE 123*  BUN 28*  CREATININE 1.36*  CALCIUM 9.1   PT/INR Recent Labs    08/05/19 0633  LABPROT 13.4  INR 1.0   CMP     Component Value Date/Time   NA 139 08/05/2019 0536   NA 142 06/17/2019 1112   K 2.7 (LL) 08/05/2019 0536   CL 89 (L) 08/05/2019 0536   CO2 36 (H) 08/05/2019 0536   GLUCOSE 123 (H) 08/05/2019 0536   BUN 28 (H) 08/05/2019 0536   BUN 29 06/17/2019 1112   CREATININE 1.36 (H) 08/05/2019 0536   CALCIUM 9.1 08/05/2019 0536   PROT 6.7 08/05/2019 0536   PROT 7.2 02/06/2019 1039   ALBUMIN 2.4 (L) 08/05/2019 0536   ALBUMIN 4.1 02/06/2019 1039   AST 24 08/05/2019 0536   ALT 18 08/05/2019 0536   ALKPHOS 66 08/05/2019 0536   BILITOT 1.2 08/05/2019 0536   BILITOT 0.6 02/06/2019 1039   GFRNONAA  34 (L) 08/05/2019 0536   GFRAA 39 (L) 08/05/2019 0536   Lipase     Component Value Date/Time   LIPASE 28 08/05/2019 0536       Studies/Results: CT ABDOMEN PELVIS W CONTRAST  Result Date: 08/05/2019 CLINICAL DATA:  86 year old female with history of abdominal pain. EXAM: CT ABDOMEN AND PELVIS WITH CONTRAST TECHNIQUE: Multidetector CT imaging of the abdomen and pelvis was performed using the standard protocol following bolus administration of intravenous contrast. CONTRAST:  41mL OMNIPAQUE IOHEXOL 300 MG/ML  SOLN COMPARISON:  CT the abdomen and pelvis 07/30/2019. FINDINGS: Lower chest: In the posterior aspect of the right lower lobe there is a pleural-based lesion measuring 2.8 x 1.0 cm (axial image 16 of series 5), which appears similar to the recent prior examination, as well as more remote prior study from 05/29/2019. Atherosclerotic calcifications in the thoracic aorta as well as the left circumflex and right coronary arteries. Mild cardiomegaly with biatrial dilatation. Hepatobiliary: No suspicious cystic or solid hepatic lesions. No intra or extrahepatic biliary ductal dilatation. Gallbladder is normal in appearance. Pancreas: No pancreatic mass. No pancreatic ductal dilatation. No pancreatic or peripancreatic fluid collections or inflammatory changes. Spleen: Unremarkable. Adrenals/Urinary Tract: Multiple low-attenuation lesions are noted  in both kidneys, compatible with simple cysts, largest of which is in the upper pole the right kidney measuring 3.3 cm in diameter. No suspicious renal lesions. No hydroureteronephrosis. Urinary bladder is normal in appearance. Bilateral adrenal glands are within normal limits. Stomach/Bowel: Normal appearance of the stomach. No pathologic dilatation of small bowel or colon. The appendix is dilated measuring up to 3 cm in diameter. Hip pigtail drainage catheter is in place within the dilated appendix. Extending from the dilated appendix through the right lateral  abdominal wall musculature and to the skin surface there is a fistulous tract with fluid and gas, and surrounding rim enhancement. This is irregular in shape and therefore difficult to discretely measure, but appears grossly similar to the recent prior examination. Vascular/Lymphatic: Aortic atherosclerosis, without evidence of aneurysm or dissection in the abdominal or pelvic vasculature. No lymphadenopathy noted in the abdomen or pelvis. Reproductive: Uterus and ovaries are unremarkable in appearance. Other: No significant volume of ascites.  No pneumoperitoneum. Musculoskeletal: There are no aggressive appearing lytic or blastic lesions noted in the visualized portions of the skeleton. Chronic compression fractures of T12 and L3, most severe at T12 where there is 50% loss of anterior vertebral body height. IMPRESSION: 1. Pigtail drainage catheter appears located within a dilated appendix or large periappendiceal abscess, which is again associated with a fistulous tract which drains through the right lateral abdominal wall musculature to the overlying skin surface. This overall appears very similar to the recent prior CT 07/30/2019. 2. Pleural-based nodule in the posterior aspect of the right lower lobe measuring 2.8 x 1.0 cm. This appears roughly stable in size dating back to 05/29/2019. The stability is reassuring, however, the possibility of neoplasm is not excluded. Close attention on follow-up imaging is recommended, with repeat noncontrast chest CT in 3-6 months to continue surveillance of this lesion. 3. Aortic atherosclerosis, in addition to at least 2 vessel coronary artery disease. 4. Cardiomegaly with biatrial dilatation. 5. Additional incidental findings, as above. Electronically Signed   By: Vinnie Langton M.D.   On: 08/05/2019 08:26   IR Catheter Tube Change  Result Date: 08/05/2019 INDICATION: Perforated appendicitis, persistent abscess despite 2 months of drain management EXAM: FLUOROSCOPIC  RIGHT LOWER QUADRANT DRAIN EXCHANGE AND UP SIZE MEDICATIONS: The patient is currently admitted to the hospital and receiving intravenous antibiotics. The antibiotics were administered within an appropriate time frame prior to the initiation of the procedure. ANESTHESIA/SEDATION: Fentanyl 50 mcg IV; Versed 0 mg IV Moderate Sedation Time:  0 The patient was continuously monitored during the procedure by the interventional radiology nurse under my direct supervision. COMPLICATIONS: None immediate. PROCEDURE: Informed written consent was obtained from the patient after a thorough discussion of the procedural risks, benefits and alternatives. All questions were addressed. Maximal Sterile Barrier Technique was utilized including caps, mask, sterile gowns, sterile gloves, sterile drape, hand hygiene and skin antiseptic. A timeout was performed prior to the initiation of the procedure. Previous CT reviewed. Under sterile conditions and local anesthesia, the existing 12 French drain was removed over an Amplatz guidewire. Fourteen [place] drain advanced. Retention loop formed the abscess cavity. Contrast injection confirms placement of the drain as well as communication to the adjacent cutaneous fistula tract from a prior drain. Syringe aspiration yielded 30 cc mucinous fecal contaminated thick abscess fluid. Sample sent for culture. Abscess irrigated with 60 cc saline and connected to external suction bulb. Drain secured with Prolene suture and sterile dressing. Overall patient tolerated the procedure well. IMPRESSION: Successful fluoroscopic upsize  of the right lower quadrant appendix abscess drain as above. Electronically Signed   By: Jerilynn Mages.  Shick M.D.   On: 08/05/2019 11:52    Anti-infectives: Anti-infectives (From admission, onward)   Start     Dose/Rate Route Frequency Ordered Stop   08/05/19 1700  piperacillin-tazobactam (ZOSYN) IVPB 3.375 g     3.375 g 12.5 mL/hr over 240 Minutes Intravenous Every 8 hours 08/05/19  1009     08/05/19 0645  ceFEPIme (MAXIPIME) 2 g in sodium chloride 0.9 % 100 mL IVPB  Status:  Discontinued     2 g 200 mL/hr over 30 Minutes Intravenous Every 24 hours 08/05/19 0633 08/05/19 0946   08/05/19 0645  metroNIDAZOLE (FLAGYL) IVPB 500 mg  Status:  Discontinued     500 mg 100 mL/hr over 60 Minutes Intravenous  Once 08/05/19 3338 08/05/19 1021   08/05/19 0645  vancomycin (VANCOCIN) IVPB 1000 mg/200 mL premix  Status:  Discontinued     1,000 mg 200 mL/hr over 60 Minutes Intravenous Every 48 hours 08/05/19 0639 08/05/19 3291       Assessment/Plan A fib, cont to hold coumadin and cont heparin gtt for now CHF HTN  Perforated appendicitis with periappendiceal abscess and fistula -patient with upsize in drain yesterday by IR -cont drain for now, but will need to discuss benefit of continuing conservative management vs surgical intervention given persistence of fistula and abscess despite 2 months of drain therapy. -I briefly discussed this with the patient about the risk of continuing her drain vs surgical intervention.  I told her I would further discuss with Dr. Rosendo Gros to see what he thinks as well.  She asked if I would let Dr. Johney Maine know too.  I will forward him my note so he is aware she is here. -may eat today while trying to decide how best to proceed.  FEN - heart health VTE - heparin gtt ID - [id number]   LOS: 1 day    Henreitta Cea , [facility] 08/06/2019, 8:31 AM Please see Amion for pager number during day hours 7:00am-4:30pm or 7:00am -11:30am on weekends

## 2019-08-06 NOTE — Progress Notes (Signed)
[place] for Heparin (while Warfarin on hold and INR<2) Indication: atrial fibrillation  No Known Allergies  Patient Measurements: Height: 5' (152.4 cm) Weight: 68.2 kg (150 lb 6.4 oz) IBW/kg (Calculated) : 45.5 Heparin Dosing Weight: 62.3 kg  Vital Signs: Temp: 97.8 F (36.6 C) (04/13 1412) Temp Source: Oral (04/13 1412) BP: 106/56 (04/13 1412) Pulse Rate: 66 (04/13 1412)  Labs: Recent Labs    08/05/19 0536 08/05/19 1224 08/05/19 2235 08/06/19 0833 08/06/19 1601  HGB 14.8  --   --  13.1  --   HCT 44.2  --   --  40.2  --   PLT 299  --   --  258  --   APTT  --  29  --   --   --   LABPROT  --  13.4  --  14.4  --   INR  --  1.0  --  1.1  --   HEPARINUNFRC  --   --  <0.10* 0.32 0.36  CREATININE 1.36*  --   --  0.99  --     Estimated Creatinine Clearance: 31.9 mL/min (by C-G formula based on SCr of 0.99 mg/dL).   Medications:  Infusions:  . heparin 1,100 Units/hr (08/06/19 1142)  . piperacillin-tazobactam (ZOSYN)  IV 3.375 g (08/06/19 0934)    Assessment: 86 yr old female on warfarin PTA for history of afib, perforated appendicitis with recent drain placement 05/2019 presented with RLQ pain and drain replacement 4/12. Pharmacy was consulted to d/c warfarin and start heparin IV, as patient may now need further intervention with worsening peristent abscess, despite drain management x 8 weeks.  Heparin level this morning was therapeutic after a rate increase to 1100 units/hr earlier today (HL 0.32, goal of 0.3-0.7). Confirmatory heparin level this afternoon remains therapeutic at 0.36 units/ml. H/H, platelets WNL. Per RN, no issues with IV or bleeding observed.  Goal of Therapy:  Heparin level 0.3-0.7 units/ml Monitor platelets by anticoagulation protocol: Yes   Plan:  Continue heparin at 1100 units/hr Monitor daily heparin level, CBC Monitor for sign/symptoms of bleeding  Gillermina Hu, PharmD, BCPS, [facility] Clinical  Pharmacist 08/06/2019 5:05 PM

## 2019-08-06 NOTE — Progress Notes (Signed)
Subjective:  Patient seen at bedside this AM. Patient states she has some pain at the incision but her abdominal pain from before is no longer present. Patient counseled on plan of care, all questions answered.  Objective:   Vital Signs (last 24 hours): Vitals:   08/05/19 1900 08/05/19 1959 08/06/19 0239 08/06/19 0531  BP: (!) 127/57 (!) 129/50 108/65 (!) 109/56  Pulse: 78 64 73 75  Resp: (!) 21 16 15 18   Temp:  98 F (36.7 C) 98 F (36.7 C) 97.9 F (36.6 C)  TempSrc:  Oral Oral Oral  SpO2: 99% 97% 96% 96%  Weight:  68.2 kg    Height:        Physical Exam: General Alert and answers questions appropriately, no acute distress  Cardiac Regular rate and rhythm, no murmurs, rubs, or gallops  Pulmonary Clear to auscultation bilaterally without wheezes, rhonchi, or rales   CT Abdomen/Pelvis (08/05/19):  IMPRESSION: 1. Pigtail drainage catheter appears located within a dilated appendix or large periappendiceal abscess, which is again associated with a fistulous tract which drains through the right lateral abdominal wall musculature to the overlying skin surface. This overall appears very similar to the recent prior CT 07/30/2019. 2. Pleural-based nodule in the posterior aspect of the right lower lobe measuring 2.8 x 1.0 cm. This appears roughly stable in size dating back to 05/29/2019. The stability is reassuring, however, the possibility of neoplasm is not excluded. Close attention on follow-up imaging is recommended, with repeat noncontrast chest CT in 3-6 months to continue surveillance of this lesion. 3. Aortic atherosclerosis, in addition to at least 2 vessel coronary artery disease. 4. Cardiomegaly with biatrial dilatation. 5. Additional incidental findings, as above.  CBC Latest Ref Rng & Units 08/06/2019 08/05/2019 07/17/2019  WBC 4.0 - 10.5 K/uL 10.7(H) 12.3(H) 10.6(H)  Hemoglobin 12.0 - 15.0 g/dL 13.1 14.8 14.9  Hematocrit 36.0 - 46.0 % 40.2 44.2 45.9  Platelets 150 -  400 K/uL 258 299 354   BMP Latest Ref Rng & Units 08/06/2019 08/05/2019 06/17/2019  Glucose 70 - 99 mg/dL 94 123(H) 110(H)  BUN 8 - 23 mg/dL 14 28(H) 29  Creatinine 0.44 - 1.00 mg/dL 0.99 1.36(H) 1.51(H)  BUN/Creat Ratio 12 - 28 - - 19  Sodium 135 - 145 mmol/L 141 139 142  Potassium 3.5 - 5.1 mmol/L 3.6 2.7(LL) 3.4(L)  Chloride 98 - 111 mmol/L 101 89(L) 94(L)  CO2 22 - 32 mmol/L 29 36(H) 27  Calcium 8.9 - 10.3 mg/dL 8.3(L) 9.1 9.0    Assessment/Plan:   Principal Problem:   Abscess of abdominal cavity (HCC) Active Problems:   Permanent atrial fibrillation (HCC)   CHF (congestive heart failure) (HCC)   Chronic kidney disease (CKD), stage III (moderate)  Patient is a 86 year old female with past medical history significant for atrial fibrillation on warfarin, hypertension, HFrEF, CKD stage IIIb, and recent appendiceal abscess status post drain placement who presented to the ER on 08/05/2019 with right lower quadrant abdominal pain and copious serosanguineous drainage from drain site.  # Abscess of abdominal cavity: IR drain placement yesterday. CT abdomen/pelvis confirmed persistent abscess with fistula.  Patient received 1 dose of cefepime and metronidazole in ER, and was placed on Zosyn on admission. *Continue zosyn per pharmacy consult *Surgery considering operative management *Full liquid diet for now  # Atrial fibrillation: Patient with history of atrial fibrillation, on warfarin, EKG on admission demonstrates atrial fibrillation. *Continue heparin per pharmacy consult pending surgery recommendations *Diltiazem (Cardizem CD) 240 mg twice  daily *Metoprolol (toprol-XL) 50 mg once daily  # Hx of C. diff: Antibiotic use with intermittent loose and formed stool. C. Diff PCR positive, treated with PO vanc with repeat testing performed which was negative. Stool continued to be intermittently loose and formed which is highly inconsistent with C. Diff. Will monitor this admission and  consider testing if indicated.   Hypothyroidism: *Continue thyroid 150 mg daily  PT/OT: Consult not indicated at this time Diet: Full liquid DVT Ppx: On therapeutic  heparin Admit Status: Inpatient Dispo: Anticipated discharge pending clinical improvement  Jeanmarie Hubert, MD 08/06/2019, 7:03 AM

## 2019-08-06 NOTE — Plan of Care (Signed)
  Problem: Education: Goal: Knowledge of General Education information will improve Description Including pain rating scale, medication(s)/side effects and non-pharmacologic comfort measures Outcome: Progressing   

## 2019-08-06 NOTE — Progress Notes (Signed)
[place] for Heparin (while Warfarin on hold and INR<2) Indication: atrial fibrillation  No Known Allergies  Patient Measurements: Height: 5' (152.4 cm) Weight: 68.2 kg (150 lb 6.4 oz) IBW/kg (Calculated) : 45.5 Heparin Dosing Weight: 62.3 kg  Vital Signs: Temp: 97.9 F (36.6 C) (04/13 0531) Temp Source: Oral (04/13 0531) BP: 109/56 (04/13 0531) Pulse Rate: 75 (04/13 0531)  Labs: Recent Labs    08/05/19 0536 08/05/19 4680 08/05/19 2235 08/06/19 0833  HGB 14.8  --   --  13.1  HCT 44.2  --   --  40.2  PLT 299  --   --  258  APTT  --  29  --   --   LABPROT  --  13.4  --  14.4  INR  --  1.0  --  1.1  HEPARINUNFRC  --   --  <0.10* 0.32  CREATININE 1.36*  --   --  0.99    Estimated Creatinine Clearance: 31.9 mL/min (by C-G formula based on SCr of 0.99 mg/dL).   Medications:  Infusions:  . heparin 1,100 Units/hr (08/06/19 0217)  . piperacillin-tazobactam (ZOSYN)  IV 3.375 g (08/06/19 0934)    Assessment: 23 YOF on warfarin PTA for history of afib, perforated appendicitis with recent drain placement 05/2019 presenting with RLQ pain and drain replacement 4/12. Pharmacy consulted to d/c warfarin and start heparin IV, as patient may now need further intervention with worsening peristent abscess despite drain management x 8 weeks.  Heparin level this morning is therapeutic after a rate increase earlier today (HL 0.32, goal of 0.3-0.7). CBC stable - no bleeding noted at this time.   Goal of Therapy:  Heparin level 0.3-0.7 units/ml Monitor platelets by anticoagulation protocol: Yes   Plan:  - Continue Heparin at 1100 units/hr (11 ml/hr - Will continue to monitor for any signs/symptoms of bleeding and will follow up with heparin level in 8 hours   Thank you for allowing pharmacy to be a part of this patient's care.  Alycia Rossetti, PharmD, BCPS Clinical Pharmacist Clinical phone for 08/06/2019: [phone] 08/06/2019 9:48 AM    **Pharmacist phone directory can now be found on [email or web address] ([email or web address]).  Listed under [place].

## 2019-08-07 ENCOUNTER — Encounter (HOSPITAL_COMMUNITY)
Admission: EM | Disposition: A | Discharge status: Home / Self Care | Payer: Self-pay | Source: Home / Self Care | Attending: Internal Medicine

## 2019-08-07 ENCOUNTER — Inpatient Hospital Stay (HOSPITAL_COMMUNITY): Payer: Medicare Other | Admitting: Certified Registered"

## 2019-08-07 ENCOUNTER — Encounter (HOSPITAL_COMMUNITY): Payer: Self-pay | Admitting: Internal Medicine

## 2019-08-07 HISTORY — PX: LAPAROSCOPY: SHX197

## 2019-08-07 HISTORY — PX: LAPAROSCOPIC APPENDECTOMY: SHX408

## 2019-08-07 LAB — TYPE AND SCREEN
ABO/RH(D): O POS
Antibody Screen: NEGATIVE

## 2019-08-07 LAB — BASIC METABOLIC PANEL
Anion gap: 10 (ref 5–15)
BUN: 13 mg/dL (ref 8–23)
CO2: 32 mmol/L (ref 22–32)
Calcium: 8.3 mg/dL — ABNORMAL LOW (ref 8.9–10.3)
Chloride: 100 mmol/L (ref 98–111)
Creatinine, Ser: 1.01 mg/dL — ABNORMAL HIGH (ref 0.44–1.00)
GFR calc Af Amer: 56 mL/min — ABNORMAL LOW (ref >60)
GFR calc non Af Amer: 49 mL/min — ABNORMAL LOW (ref >60)
Glucose, Bld: 99 mg/dL (ref 70–99)
Potassium: 3.1 mmol/L — ABNORMAL LOW (ref 3.5–5.1)
Sodium: 142 mmol/L (ref 135–145)

## 2019-08-07 LAB — CBC
HCT: 39.7 % (ref 36.0–46.0)
Hemoglobin: 12.8 g/dL (ref 12.0–15.0)
MCH: 30.3 pg (ref 26.0–34.0)
MCHC: 32.2 g/dL (ref 30.0–36.0)
MCV: 94.1 fL (ref 80.0–100.0)
Platelets: 276 10*3/uL (ref 150–400)
RBC: 4.22 MIL/uL (ref 3.87–5.11)
RDW: 17 % — ABNORMAL HIGH (ref 11.5–15.5)
WBC: 9.2 10*3/uL (ref 4.0–10.5)
nRBC: 0 % (ref 0.0–0.2)

## 2019-08-07 LAB — ABO/RH: ABO/RH(D): O POS

## 2019-08-07 LAB — PROTIME-INR
INR: 1.2 (ref 0.8–1.2)
Prothrombin Time: 14.8 seconds (ref 11.4–15.2)

## 2019-08-07 LAB — MAGNESIUM: Magnesium: 2.5 mg/dL — ABNORMAL HIGH (ref 1.7–2.4)

## 2019-08-07 SURGERY — APPENDECTOMY, LAPAROSCOPIC
Anesthesia: General | Site: Abdomen

## 2019-08-07 MED ORDER — OXYCODONE HCL 5 MG PO TABS: 5.0000 mg | ORAL_TABLET | Freq: Once | ORAL | Status: DC | PRN

## 2019-08-07 MED ORDER — PROPOFOL 10 MG/ML IV BOLUS
INTRAVENOUS | Status: DC | PRN
  Administered 2019-08-07: 100 mg via INTRAVENOUS

## 2019-08-07 MED ORDER — LIDOCAINE 2% (20 MG/ML) 5 ML SYRINGE
INTRAMUSCULAR | Status: DC | PRN
  Administered 2019-08-07: 60 mg via INTRAVENOUS

## 2019-08-07 MED ORDER — PHENYLEPHRINE 40 MCG/ML (10ML) SYRINGE FOR IV PUSH (FOR BLOOD PRESSURE SUPPORT)
PREFILLED_SYRINGE | INTRAVENOUS | Status: DC | PRN
  Administered 2019-08-07 (×2): 80 ug via INTRAVENOUS

## 2019-08-07 MED ORDER — OXYCODONE HCL 5 MG/5ML PO SOLN: 5.0000 mg | Freq: Once | ORAL | Status: DC | PRN

## 2019-08-07 MED ORDER — OXYCODONE HCL 5 MG PO TABS: 5.0000 mg | ORAL_TABLET | ORAL | Status: DC | PRN

## 2019-08-07 MED ORDER — 0.9 % SODIUM CHLORIDE (POUR BTL) OPTIME
TOPICAL | Status: DC | PRN
  Administered 2019-08-07: 1000 mL

## 2019-08-07 MED ORDER — BUPIVACAINE HCL (PF) 0.25 % IJ SOLN
INTRAMUSCULAR | Status: AC
  Filled 2019-08-07: qty 30

## 2019-08-07 MED ORDER — LACTATED RINGERS IV SOLN: INTRAVENOUS | Status: DC | PRN

## 2019-08-07 MED ORDER — PROMETHAZINE HCL 25 MG/ML IJ SOLN: 6.2500 mg | INTRAMUSCULAR | Status: DC | PRN

## 2019-08-07 MED ORDER — ROCURONIUM BROMIDE 100 MG/10ML IV SOLN
INTRAVENOUS | Status: DC | PRN
  Administered 2019-08-07: 70 mg via INTRAVENOUS

## 2019-08-07 MED ORDER — FENTANYL CITRATE (PF) 250 MCG/5ML IJ SOLN
INTRAMUSCULAR | Status: AC
  Filled 2019-08-07: qty 5

## 2019-08-07 MED ORDER — MUPIROCIN 2 % EX OINT
1.0000 "application " | TOPICAL_OINTMENT | Freq: Two times a day (BID) | CUTANEOUS | Status: DC
  Administered 2019-08-07 – 2019-08-09 (×5): 1 via NASAL
  Filled 2019-08-07: qty 22

## 2019-08-07 MED ORDER — CHLORHEXIDINE GLUCONATE CLOTH 2 % EX PADS
6.0000 | MEDICATED_PAD | Freq: Every day | CUTANEOUS | Status: DC
  Administered 2019-08-07 – 2019-08-09 (×3): 6 via TOPICAL

## 2019-08-07 MED ORDER — LACTATED RINGERS IV SOLN: INTRAVENOUS | Status: DC

## 2019-08-07 MED ORDER — POTASSIUM CHLORIDE 10 MEQ/100ML IV SOLN
10.0000 meq | INTRAVENOUS | Status: AC
  Administered 2019-08-07 (×5): 10 meq via INTRAVENOUS
  Filled 2019-08-07 (×2): qty 100

## 2019-08-07 MED ORDER — HYDROMORPHONE HCL 1 MG/ML IJ SOLN
INTRAMUSCULAR | Status: AC
  Filled 2019-08-07: qty 1

## 2019-08-07 MED ORDER — BUPIVACAINE HCL 0.25 % IJ SOLN
INTRAMUSCULAR | Status: DC | PRN
  Administered 2019-08-07: 5 mL

## 2019-08-07 MED ORDER — ONDANSETRON HCL 4 MG/2ML IJ SOLN
INTRAMUSCULAR | Status: DC | PRN
  Administered 2019-08-07: 4 mg via INTRAVENOUS

## 2019-08-07 MED ORDER — FENTANYL CITRATE (PF) 100 MCG/2ML IJ SOLN
INTRAMUSCULAR | Status: DC | PRN
  Administered 2019-08-07 (×2): 25 ug via INTRAVENOUS
  Administered 2019-08-07: 50 ug via INTRAVENOUS
  Administered 2019-08-07: 25 ug via INTRAVENOUS
  Administered 2019-08-07: 50 ug via INTRAVENOUS
  Administered 2019-08-07: 25 ug via INTRAVENOUS

## 2019-08-07 MED ORDER — ACETAMINOPHEN 325 MG PO TABS: 650.0000 mg | ORAL_TABLET | Freq: Four times a day (QID) | ORAL | Status: DC | PRN

## 2019-08-07 MED ORDER — PHENYLEPHRINE HCL-NACL 10-0.9 MG/250ML-% IV SOLN
INTRAVENOUS | Status: DC | PRN
  Administered 2019-08-07: 40 ug/min via INTRAVENOUS

## 2019-08-07 MED ORDER — SUGAMMADEX SODIUM 200 MG/2ML IV SOLN
INTRAVENOUS | Status: DC | PRN
  Administered 2019-08-07: 125 mg via INTRAVENOUS

## 2019-08-07 MED ORDER — MORPHINE SULFATE (PF) 2 MG/ML IV SOLN: 1.0000 mg | INTRAVENOUS | Status: DC | PRN

## 2019-08-07 MED ORDER — HYDROMORPHONE HCL 1 MG/ML IJ SOLN
0.2500 mg | INTRAMUSCULAR | Status: DC | PRN
  Administered 2019-08-07 (×2): 0.25 mg via INTRAVENOUS

## 2019-08-07 MED ORDER — PROPOFOL 10 MG/ML IV BOLUS
INTRAVENOUS | Status: AC
  Filled 2019-08-07: qty 20

## 2019-08-07 MED ORDER — HEPARIN (PORCINE) 25000 UT/250ML-% IV SOLN
1100.0000 [IU]/h | INTRAVENOUS | Status: DC
  Administered 2019-08-07 – 2019-08-08 (×2): 1100 [IU]/h via INTRAVENOUS
  Filled 2019-08-07 (×4): qty 250

## 2019-08-07 MED ORDER — DEXAMETHASONE SODIUM PHOSPHATE 10 MG/ML IJ SOLN
INTRAMUSCULAR | Status: DC | PRN
  Administered 2019-08-07: 5 mg via INTRAVENOUS

## 2019-08-07 MED ORDER — SODIUM CHLORIDE 0.9 % IR SOLN
Status: DC | PRN
  Administered 2019-08-07: 1000 mL

## 2019-08-07 MED ORDER — ALBUMIN HUMAN 5 % IV SOLN: INTRAVENOUS | Status: DC | PRN

## 2019-08-07 SURGICAL SUPPLY — 93 items
APPLIER CLIP 5 13 M/L LIGAMAX5 (MISCELLANEOUS)
APPLIER CLIP ROT 10 11.4 M/L (STAPLE)
BLADE CLIPPER SURG (BLADE) IMPLANT
CANISTER SUCT 3000ML PPV (MISCELLANEOUS) ×4 IMPLANT
CELLS DAT CNTRL 66122 CELL SVR (MISCELLANEOUS) IMPLANT
CHLORAPREP W/TINT 26 (MISCELLANEOUS) ×4 IMPLANT
CLIP APPLIE 5 13 M/L LIGAMAX5 (MISCELLANEOUS) IMPLANT
CLIP APPLIE ROT 10 11.4 M/L (STAPLE) IMPLANT
CLIP VESOLOCK XL 6/CT (CLIP) IMPLANT
COVER MAYO STAND STRL (DRAPES) IMPLANT
COVER SURGICAL LIGHT HANDLE (MISCELLANEOUS) ×4 IMPLANT
COVER TRANSDUCER ULTRASND (DRAPES) IMPLANT
COVER WAND RF STERILE (DRAPES) ×4 IMPLANT
DEFOGGER SCOPE WARMER CLEARIFY (MISCELLANEOUS) ×4 IMPLANT
DERMABOND ADVANCED (GAUZE/BANDAGES/DRESSINGS) ×2
DERMABOND ADVANCED .7 DNX12 (GAUZE/BANDAGES/DRESSINGS) ×2 IMPLANT
DEVICE TROCAR PUNCTURE CLOSURE (ENDOMECHANICALS) ×4 IMPLANT
DRAIN CHANNEL 19F RND (DRAIN) IMPLANT
DRAIN PENROSE 0.5X18 (DRAIN) ×4 IMPLANT
DRAPE HALF SHEET 40X57 (DRAPES) IMPLANT
DRAPE UTILITY XL STRL (DRAPES) IMPLANT
DRAPE WARM FLUID 44X44 (DRAPES) ×4 IMPLANT
DRSG OPSITE POSTOP 4X10 (GAUZE/BANDAGES/DRESSINGS) IMPLANT
DRSG OPSITE POSTOP 4X8 (GAUZE/BANDAGES/DRESSINGS) IMPLANT
ELECT BLADE 6.5 EXT (BLADE) IMPLANT
ELECT CAUTERY BLADE 6.4 (BLADE) IMPLANT
ELECT REM PT RETURN 9FT ADLT (ELECTROSURGICAL) ×4
ELECTRODE REM PT RTRN 9FT ADLT (ELECTROSURGICAL) ×2 IMPLANT
ENDOLOOP SUT PDS II  0 18 (SUTURE) ×6
ENDOLOOP SUT PDS II 0 18 (SUTURE) ×6 IMPLANT
EVACUATOR SILICONE 100CC (DRAIN) ×4 IMPLANT
GAUZE SPONGE 4X4 12PLY STRL (GAUZE/BANDAGES/DRESSINGS) ×4 IMPLANT
GEL ULTRASOUND 20GR AQUASONIC (MISCELLANEOUS) IMPLANT
GLOVE BIO SURGEON STRL SZ7.5 (GLOVE) ×8 IMPLANT
GLOVE BIOGEL PI IND STRL 8 (GLOVE) IMPLANT
GLOVE BIOGEL PI INDICATOR 8 (GLOVE)
GOWN STRL REUS W/ TWL LRG LVL3 (GOWN DISPOSABLE) ×6 IMPLANT
GOWN STRL REUS W/ TWL XL LVL3 (GOWN DISPOSABLE) ×2 IMPLANT
GOWN STRL REUS W/TWL LRG LVL3 (GOWN DISPOSABLE) ×6
GOWN STRL REUS W/TWL XL LVL3 (GOWN DISPOSABLE) ×2
GRASPER SUT TROCAR 14GX15 (MISCELLANEOUS) ×4 IMPLANT
KIT BASIN OR (CUSTOM PROCEDURE TRAY) ×4 IMPLANT
KIT SIGMOIDOSCOPE (SET/KITS/TRAYS/PACK) IMPLANT
KIT TURNOVER KIT B (KITS) ×4 IMPLANT
LEGGING LITHOTOMY PAIR STRL (DRAPES) IMPLANT
LIGASURE IMPACT 36 18CM CVD LR (INSTRUMENTS) IMPLANT
NEEDLE INSUFFLATION 14GA 120MM (NEEDLE) ×4 IMPLANT
NS IRRIG 1000ML POUR BTL (IV SOLUTION) ×4 IMPLANT
PAD ARMBOARD 7.5X6 YLW CONV (MISCELLANEOUS) ×8 IMPLANT
PENCIL BUTTON HOLSTER BLD 10FT (ELECTRODE) ×4 IMPLANT
PENCIL SMOKE EVACUATOR (MISCELLANEOUS) IMPLANT
POUCH LAPAROSCOPIC INSTRUMENT (MISCELLANEOUS) IMPLANT
POUCH RETRIEVAL ECOSAC 10 (ENDOMECHANICALS) ×2 IMPLANT
POUCH RETRIEVAL ECOSAC 10MM (ENDOMECHANICALS) ×2
RTRCTR WOUND ALEXIS 18CM MED (MISCELLANEOUS)
SCISSORS LAP 5X35 DISP (ENDOMECHANICALS) ×4 IMPLANT
SET IRRIG TUBING LAPAROSCOPIC (IRRIGATION / IRRIGATOR) ×4 IMPLANT
SET TUBE SMOKE EVAC HIGH FLOW (TUBING) ×4 IMPLANT
SHEARS HARMONIC ACE PLUS 36CM (ENDOMECHANICALS) IMPLANT
SLEEVE ENDOPATH XCEL 5M (ENDOMECHANICALS) ×8 IMPLANT
SPECIMEN JAR LARGE (MISCELLANEOUS) ×4 IMPLANT
SPECIMEN JAR SMALL (MISCELLANEOUS) ×4 IMPLANT
STAPLER VISISTAT 35W (STAPLE) IMPLANT
SURGILUBE 2OZ TUBE FLIPTOP (MISCELLANEOUS) IMPLANT
SUT ETHILON 2 0 FS 18 (SUTURE) ×4 IMPLANT
SUT MNCRL AB 4-0 PS2 18 (SUTURE) ×4 IMPLANT
SUT PDS AB 1 CT  36 (SUTURE)
SUT PDS AB 1 CT 36 (SUTURE) IMPLANT
SUT PROLENE 2 0 CT2 30 (SUTURE) IMPLANT
SUT PROLENE 2 0 KS (SUTURE) IMPLANT
SUT SILK 2 0 (SUTURE)
SUT SILK 2 0 SH CR/8 (SUTURE) ×4 IMPLANT
SUT SILK 2-0 18XBRD TIE 12 (SUTURE) IMPLANT
SUT SILK 3 0 (SUTURE)
SUT SILK 3 0 SH CR/8 (SUTURE) ×4 IMPLANT
SUT SILK 3-0 18XBRD TIE 12 (SUTURE) IMPLANT
SYR BULB IRRIGATION 50ML (SYRINGE) ×4 IMPLANT
SYS LAPSCP GELPORT 120MM (MISCELLANEOUS)
SYSTEM LAPSCP GELPORT 120MM (MISCELLANEOUS) IMPLANT
TOWEL GREEN STERILE (TOWEL DISPOSABLE) ×8 IMPLANT
TOWEL GREEN STERILE FF (TOWEL DISPOSABLE) ×4 IMPLANT
TRAY FOLEY W/BAG SLVR 16FR (SET/KITS/TRAYS/PACK) ×2
TRAY FOLEY W/BAG SLVR 16FR ST (SET/KITS/TRAYS/PACK) ×2 IMPLANT
TRAY LAPAROSCOPIC MC (CUSTOM PROCEDURE TRAY) ×4 IMPLANT
TROCAR XCEL 12X100 BLDLESS (ENDOMECHANICALS) ×4 IMPLANT
TROCAR XCEL BLUNT TIP 100MML (ENDOMECHANICALS) IMPLANT
TROCAR XCEL NON-BLD 11X100MML (ENDOMECHANICALS) ×4 IMPLANT
TROCAR XCEL NON-BLD 5MMX100MML (ENDOMECHANICALS) ×4 IMPLANT
TUBE CONNECTING 12'X1/4 (SUCTIONS)
TUBE CONNECTING 12X1/4 (SUCTIONS) IMPLANT
WARMER LAPAROSCOPE (MISCELLANEOUS) ×4 IMPLANT
WATER STERILE IRR 1000ML POUR (IV SOLUTION) ×4 IMPLANT
YANKAUER SUCT BULB TIP NO VENT (SUCTIONS) IMPLANT

## 2019-08-07 NOTE — Anesthesia Preprocedure Evaluation (Signed)
Anesthesia Evaluation  Patient identified by MRN, date of birth, ID band Patient awake    Reviewed: Allergy & Precautions, NPO status , Patient's Chart, lab work & pertinent test results  Airway Mallampati: II  TM Distance: >3 FB Neck ROM: Full    Dental no notable dental hx.    Pulmonary neg pulmonary ROS, Patient abstained from smoking.,    Pulmonary exam normal breath sounds clear to auscultation       Cardiovascular hypertension, Pt. on medications +CHF  Normal cardiovascular exam Rhythm:Regular Rate:Normal     Neuro/Psych negative neurological ROS  negative psych ROS   GI/Hepatic negative GI ROS, Neg liver ROS,   Endo/Other  negative endocrine ROS  Renal/GU Renal InsufficiencyRenal disease  negative genitourinary   Musculoskeletal negative musculoskeletal ROS (+)   Abdominal   Peds negative pediatric ROS (+)  Hematology negative hematology ROS (+)   Anesthesia Other Findings Perforated Appendix  Reproductive/Obstetrics negative OB ROS                             Anesthesia Physical Anesthesia Plan  ASA: III  Anesthesia Plan: General   Post-op Pain Management:    Induction: Intravenous  PONV Risk Score and Plan: 3 and Ondansetron, Dexamethasone, Midazolam and Treatment may vary due to age or medical condition  Airway Management Planned: Oral ETT  Additional Equipment:   Intra-op Plan:   Post-operative Plan: Extubation in OR  Informed Consent: I have reviewed the patients History and Physical, chart, labs and discussed the procedure including the risks, benefits and alternatives for the proposed anesthesia with the patient or authorized representative who has indicated his/her understanding and acceptance.     Dental advisory given  Plan Discussed with: CRNA  Anesthesia Plan Comments:         Anesthesia Quick Evaluation

## 2019-08-07 NOTE — Plan of Care (Signed)
  Problem: Education: Goal: Knowledge of General Education information will improve Description Including pain rating scale, medication(s)/side effects and non-pharmacologic comfort measures Outcome: Progressing   

## 2019-08-07 NOTE — Op Note (Signed)
08/07/2019  11:59 AM  PATIENT:  Ashley Dunn  86 y.o. female  PRE-OPERATIVE DIAGNOSIS:  acute perforated appendicitis  POST-OPERATIVE DIAGNOSIS:  Acute perforated appendicitis  PROCEDURE:  Procedure(s): LAPAROSCOPIC  APPENDECTOMY (N/A) Laparoscopy Diagnostic (N/A)  SURGEON:  Surgeon(s) and Role:    Ashley Ok, MD - Primary  ASSISTANTS: Izola Price, RNFA   ANESTHESIA:   local and general  EBL:  10 mL   BLOOD ADMINISTERED:none  DRAINS: Penrose drain in the SQ RLQ area   LOCAL MEDICATIONS USED:  BUPIVICAINE   SPECIMEN:  Source of Specimen:  Ruptured appendix  DISPOSITION OF SPECIMEN:  PATHOLOGY  COUNTS:  YES  TOURNIQUET:  * No tourniquets in log *  DICTATION: .Dragon Dictation Indication for procedure: Patient is a 86 year old female who came in with a 22-month history of unresolving perforation of the appendix.  Patient had significant mucinous drainage from the drain.  This continue to be resolved for the 48-month.  Secondary to failure to resolve patient was taken to the operating for attempted laparoscopic appendectomy.  Findings: Patient had a adherent perforated appendix to the right lower quadrant abdominal wall.  There was large amount of mucinous that could be seen in the abscess cavity.  This also appeared to be the case at the appendix.  The patient appendix was visualized and the appendix was taken at the base.  This was sent to pathology for frozen section.  There were no neoplastic signs per frozen pathology.  Details procedure: Complications: none  Counts: reported as correct x 2  Specimen: Appendix  The patient was taken back to the operating room. The patient was placed in supine position with bilateral SCDs in place.  A foley catheter was place. The patient was prepped and draped in the usual sterile fashion.  After appropriate anitbiotics were confirmed, a time-out was confirmed and all facts were verified.    A pneumoperitoneum of 14 mmHg was  obtained via a Veress needle technique in the left upper quadrant quadrant.  A 5 mm trocar and 5 mm camera then placed intra-abdominally there is no injury to any intra-abdominal organs a 10 mm infraumbilical port was placed and direct visualization as was a 5 mm port in the suprapubic area, and left lower quadrant..   The appendix was identified and seen to be walled off and adherent to the right upper quadrant.  This was taken down bluntly.  The abscess cavity had IR drain in place appear to have some mucinous content.  The drain was removed.  The appendix was dissected away from the surrounding abdominal wall. the appendix was cleaned down to the appendiceal base. The mesoappendix was then incised and the appendiceal artery was cauterized.  The the appendiceal base was clean.  An Endoloop was placed proximallyx2 and one distally and the appendix was transected between these 2. A retrieval bag was then placed into the abdomen and the specimen placed in the bag.  The specimen bag was removed.  This was sent off to pathology for frozen section.  I was able to visualize the liver and the peritoneum, and there were no other abnormal findings acutely seen.   #1 Vicryl was used to reapproximate the fascia at the umbilical port site x2. The skin was reapproximated all port sites 3-0 Monocryl subcuticular fashion. The skin was dressed with Dermabond.  Secondary to the previous induration and abscess of the right lower quadrant site.  I made an incision this area with Bovie cautery dissection was taken down  to the abscess subcutaneous cavity.  2 Penrose drains were then placed into the subcutaneous abscess.  This was dressed with 4 x 4's and ABD pad.   The patient had the foley removed. The patient was awakened from general anesthesia was taken to recovery room in stable condition.      PLAN OF CARE: Admit to inpatient   PATIENT DISPOSITION:  PACU - hemodynamically stable.   Delay start of Pharmacological  VTE agent (>24hrs) due to surgical blood loss or risk of bleeding: yes

## 2019-08-07 NOTE — Telephone Encounter (Signed)
Pharmacist is checking status of this message.

## 2019-08-07 NOTE — Transfer of Care (Signed)
Immediate Anesthesia Transfer of Care Note  Patient: Ashley Dunn  Procedure(s) Performed: LAPAROSCOPIC  APPENDECTOMY (N/A Abdomen) Laparoscopy Diagnostic and Lysis of Adhesions (N/A Abdomen)  Patient Location: PACU  Anesthesia Type:General  Level of Consciousness: drowsy and patient cooperative  Airway & Oxygen Therapy: Patient Spontanous Breathing and Patient connected to face mask oxygen  Post-op Assessment: Report given to RN and Post -op Vital signs reviewed and stable  Post vital signs: Reviewed and stable  Last Vitals:  Vitals Value Taken Time  BP 138/65 08/07/19 1225  Temp 36.5 C 08/07/19 1225  Pulse 72 08/07/19 1232  Resp 13 08/07/19 1232  SpO2 99 % 08/07/19 1232  Vitals shown include unvalidated device data.  Last Pain:  Vitals:   08/07/19 1225  TempSrc:   PainSc: (P) Asleep      Patients Stated Pain Goal: 0 (06/06/15 3567)  Complications: No apparent anesthesia complications

## 2019-08-07 NOTE — Progress Notes (Signed)
[place] for Heparin and warfarin Indication: atrial fibrillation  No Known Allergies  Patient Measurements: Height: 5' (152.4 cm) Weight: 68.2 kg (150 lb 6.4 oz) IBW/kg (Calculated) : 45.5 Heparin Dosing Weight: 62.3 kg  Vital Signs: Temp: 97.6 F (36.4 C) (04/14 1402) Temp Source: Oral (04/14 1402) BP: 127/66 (04/14 1402) Pulse Rate: 70 (04/14 1402)  Labs: Recent Labs    08/05/19 0536 08/05/19 0536 08/05/19 3300 08/05/19 2235 08/06/19 0833 08/06/19 1601 08/07/19 0511  HGB 14.8   < >  --   --  13.1  --  12.8  HCT 44.2  --   --   --  40.2  --  39.7  PLT 299  --   --   --  258  --  276  APTT  --   --  29  --   --   --   --   LABPROT  --   --  13.4  --  14.4  --  14.8  INR  --   --  1.0  --  1.1  --  1.2  HEPARINUNFRC  --   --   --  <0.10* 0.32 0.36  --   CREATININE 1.36*  --   --   --  0.99  --  1.01*   < > = values in this interval not displayed.    Estimated Creatinine Clearance: 31.3 mL/min (A) (by C-G formula based on SCr of 1.01 mg/dL (H)).   Medications:  Infusions:  . lactated ringers 10 mL/hr at 08/07/19 0927  . piperacillin-tazobactam (ZOSYN)  IV 3.375 g (08/07/19 0211)    Assessment: 86 yr old female on warfarin PTA for history of afib (CHADS2VASc = 5) held for 4/14 appendectomy. Heparin was held at [facility] for procedure. Pharmacy is now consulted to start heparin at 1800 on 4/14 and to start warfarin on 4/15 if hgb stable and tolerating PO.   Patient is s/p appendectomy with two Penrose drains placed into the subcutaneous abscess. Post-op H/H is not available. HL was last therapeutic on 1100 units/hr. INR 1.2.  Warfarin PTA dose was 2.5mg  MWFSu, 5mg  TTSa per anticoagulation clinic note 07/15/19.  Goal of Therapy:  Heparin level 0.3-0.7 units/ml Monitor platelets by anticoagulation protocol: Yes   Plan:  Restart heparin at 1100 units/hr at 18:00 tonight, no bolus, per consult  F/u 8 hr HL  Monitor daily heparin  level, CBC Monitor for sign/symptoms of bleeding Planning restart warfarin on 4/15 if hgb stable, no bleeding and tolerating PO    Benetta Spar, PharmD, BCPS, [facility] Clinical Pharmacist  Please check AMION for all [place] phone numbers After 10:00 PM, call [place]

## 2019-08-07 NOTE — Anesthesia Postprocedure Evaluation (Signed)
Anesthesia Post Note  Patient: Emillia Weatherly  Procedure(s) Performed: LAPAROSCOPIC  APPENDECTOMY (N/A Abdomen) Laparoscopy Diagnostic and Lysis of Adhesions (N/A Abdomen)     Patient location during evaluation: PACU Anesthesia Type: General Level of consciousness: awake and alert Pain management: pain level controlled Vital Signs Assessment: post-procedure vital signs reviewed and stable Respiratory status: spontaneous breathing, nonlabored ventilation and respiratory function stable Cardiovascular status: blood pressure returned to baseline and stable Postop Assessment: no apparent nausea or vomiting Anesthetic complications: no    Last Vitals:  Vitals:   08/07/19 1300 08/07/19 1315  BP: (P) 136/63 136/64  Pulse: 70 75  Resp: 17 15  Temp:    SpO2: 100% 97%    Last Pain:  Vitals:   08/07/19 1300  TempSrc:   PainSc: [place]

## 2019-08-07 NOTE — Progress Notes (Signed)
  Subjective:  Ashley Dunn was examined and evaluated at bedside this am. She was observed resting comfortably. She mentions having some pain when she moves but otherwise feels well. She was able to eat yesterday and had an episode of loose stools but no diarrhea. Denies any fevers or chills.  Objective:    Vital Signs (last 24 hours): Vitals:   08/06/19 0531 08/06/19 1412 08/06/19 2246 08/07/19 0433  BP: (!) 109/56 (!) 106/56 126/69 117/62  Pulse: 75 66 66 74  Resp: 18 17 18 18   Temp: 97.9 F (36.6 C) 97.8 F (36.6 C) 97.7 F (36.5 C) 98 F (36.7 C)  TempSrc: Oral Oral Oral Oral  SpO2: 96% 100% 99% 100%  Weight:      Height:        Physical Exam: General Alert and answers questions appropriately, no acute distress  Cardiac Regular rate and rhythm, no murmurs, rubs, or gallops  Pulmonary Clear to auscultation bilaterally without wheezes, rhonchi, or rales  Abdominal Bandage over abdominal incision is clean/dry/intact, patient without significant abdominal tenderness, no distention  Extremities Trace left-sided peripheral edema   CBC Latest Ref Rng & Units 08/07/2019 08/06/2019 08/05/2019  WBC 4.0 - 10.5 K/uL 9.2 10.7(H) 12.3(H)  Hemoglobin 12.0 - 15.0 g/dL 12.8 13.1 14.8  Hematocrit 36.0 - 46.0 % 39.7 40.2 44.2  Platelets 150 - 400 K/uL 276 258 299   BMP Latest Ref Rng & Units 08/07/2019 08/06/2019 08/05/2019  Glucose 70 - 99 mg/dL 99 94 123(H)  BUN 8 - 23 mg/dL 13 14 28(H)  Creatinine 0.44 - 1.00 mg/dL 1.01(H) 0.99 1.36(H)  BUN/Creat Ratio 12 - 28 - - -  Sodium 135 - 145 mmol/L 142 141 139  Potassium 3.5 - 5.1 mmol/L 3.1(L) 3.6 2.7(LL)  Chloride 98 - 111 mmol/L 100 101 89(L)  CO2 22 - 32 mmol/L 32 29 36(H)  Calcium 8.9 - 10.3 mg/dL 8.3(L) 8.3(L) 9.1    Assessment/Plan:   Principal Problem:   Abscess of abdominal cavity (HCC) Active Problems:   Permanent atrial fibrillation (HCC)   CHF (congestive heart failure) (HCC)   Chronic kidney disease (CKD), stage III  (moderate)  Patient is a 86 year old female with past medical history significant for atrial fibrillation on warfarin, hypertension, HFrEF, CKD stage IIIb, and recent appendiceal abscess status post drain placement who presented to the ER on 08/05/2019 with right lower quadrant abdominal pain and copious serosanguineous drainage from the drain site.  # Perforated appendicitis with periappendiceal abscess and fistula: Patient presented on ER on 05/30/19 with perforated appendicitis. IR placed abscess drain on 05/30/19 and surgery removed drain in clinic on 07/12/19. Drain was replaced on 07/17/19 after reaccumulation and upsize on 08/05/2019 when patient presented with increased right lower quadrant pain and drainage from incision site. CT abdomen/pelvis confirmed persistent abscess with fistula. Patient received 1 dose of cefepime and metronidazole in ER, and was placed on Zosyn on admission. *Continue zosyn per pharmacy consult *Plan for surgery this AM  # Atrial fibrillation: Patient with history of atrial fibrillation, on warfarin, EKG on admission demonstrates atrial fibrillation. *Continue heparin per pharmacy consult, currently held for surgery *Diltiazem (Cardizem CD) 240 mg twice daily *Metoprolol (toprol-XL) 50 mg once daily  Hypothyroidism: *Continue thyroid 150 mg daily  PT/OT: Will consult following surgery Diet: NPO for surgery DVT Ppx: On SCDs, heparin held for surgery Admit Status: Inpatient Dispo: Anticipated discharge pending clinical improvement   Ashley Hubert, MD 08/07/2019, 6:00 AM

## 2019-08-07 NOTE — Anesthesia Procedure Notes (Signed)
Procedure Name: Intubation Date/Time: 08/07/2019 10:17 AM Performed by: Gwyndolyn Saxon, CRNA Pre-anesthesia Checklist: Patient identified, Emergency Drugs available, Suction available and Patient being monitored Patient Re-evaluated:Patient Re-evaluated prior to induction Oxygen Delivery Method: Circle system utilized Preoxygenation: Pre-oxygenation with 100% oxygen Induction Type: IV induction Ventilation: Mask ventilation without difficulty Laryngoscope Size: Miller and 2 Grade View: Grade I Tube type: Oral Tube size: 6.5 mm Number of attempts: 1 Airway Equipment and Method: Stylet Placement Confirmation: ETT inserted through vocal cords under direct vision,  positive ETCO2 and breath sounds checked- equal and bilateral Secured at: 19 cm Tube secured with: Tape Dental Injury: Teeth and Oropharynx as per pre-operative assessment

## 2019-08-08 DIAGNOSIS — Z9089 Acquired absence of other organs: Secondary | ICD-10-CM

## 2019-08-08 LAB — CBC
HCT: 39.1 % (ref 36.0–46.0)
Hemoglobin: 12.7 g/dL (ref 12.0–15.0)
MCH: 30.9 pg (ref 26.0–34.0)
MCHC: 32.5 g/dL (ref 30.0–36.0)
MCV: 95.1 fL (ref 80.0–100.0)
Platelets: 261 10*3/uL (ref 150–400)
RBC: 4.11 MIL/uL (ref 3.87–5.11)
RDW: 17.1 % — ABNORMAL HIGH (ref 11.5–15.5)
WBC: 12.2 10*3/uL — ABNORMAL HIGH (ref 4.0–10.5)
nRBC: 0 % (ref 0.0–0.2)

## 2019-08-08 LAB — BASIC METABOLIC PANEL
Anion gap: 9 (ref 5–15)
BUN: 13 mg/dL (ref 8–23)
CO2: 28 mmol/L (ref 22–32)
Calcium: 8.2 mg/dL — ABNORMAL LOW (ref 8.9–10.3)
Chloride: 101 mmol/L (ref 98–111)
Creatinine, Ser: 1.28 mg/dL — ABNORMAL HIGH (ref 0.44–1.00)
GFR calc Af Amer: 42 mL/min — ABNORMAL LOW (ref >60)
GFR calc non Af Amer: 37 mL/min — ABNORMAL LOW (ref >60)
Glucose, Bld: 159 mg/dL — ABNORMAL HIGH (ref 70–99)
Potassium: 4.6 mmol/L (ref 3.5–5.1)
Sodium: 138 mmol/L (ref 135–145)

## 2019-08-08 LAB — HEPARIN LEVEL (UNFRACTIONATED): Heparin Unfractionated: 0.41 IU/mL (ref 0.30–0.70)

## 2019-08-08 LAB — PROTIME-INR
INR: 1.1 (ref 0.8–1.2)
Prothrombin Time: 14.2 seconds (ref 11.4–15.2)

## 2019-08-08 MED ORDER — WARFARIN - PHARMACIST DOSING INPATIENT: Freq: Every day | Status: DC

## 2019-08-08 MED ORDER — AMOXICILLIN-POT CLAVULANATE 500-125 MG PO TABS
1.0000 | ORAL_TABLET | Freq: Two times a day (BID) | ORAL | Status: DC
  Administered 2019-08-08 – 2019-08-09 (×3): 500 mg via ORAL
  Filled 2019-08-08 (×4): qty 1

## 2019-08-08 MED ORDER — WARFARIN SODIUM 6 MG PO TABS
6.0000 mg | ORAL_TABLET | Freq: Once | ORAL | Status: AC
  Administered 2019-08-08: 6 mg via ORAL
  Filled 2019-08-08: qty 1

## 2019-08-08 NOTE — Progress Notes (Signed)
  Subjective:  Patient seen at bedside. Patient states she feels well, states has some pain at the surgical site when she moves. Patient states she was very happy with the surgical results and was able to speak with the surgery team.  Objective:    Vital Signs (last 24 hours): Vitals:   08/07/19 1402 08/07/19 2103 08/08/19 0137 08/08/19 0532  BP: 127/66 (!) 109/59 132/74 107/64  Pulse: 70 (!) 50 75 75  Resp: 17 16 17 18   Temp: 97.6 F (36.4 C) 98 F (36.7 C) 97.7 F (36.5 C) 97.6 F (36.4 C)  TempSrc: Oral Oral Oral Oral  SpO2: 94% 96% 99% 96%  Weight:      Height:       Physical Exam: General Alert and answers questions appropriately, no acute distress  Cardiac Regular rate and rhythm, no murmurs, rubs, or gallops  Pulmonary Clear to auscultation bilaterally without wheezes, rhonchi, or rales  Abdominal Soft, moderate tenderness to palpation on right side of abdomen, no distention  Extremities No peripheral edema   CBC Latest Ref Rng & Units 08/08/2019 08/07/2019 08/06/2019  WBC 4.0 - 10.5 K/uL 12.2(H) 9.2 10.7(H)  Hemoglobin 12.0 - 15.0 g/dL 12.7 12.8 13.1  Hematocrit 36.0 - 46.0 % 39.1 39.7 40.2  Platelets 150 - 400 K/uL 261 276 258   BMP Latest Ref Rng & Units 08/08/2019 08/07/2019 08/06/2019  Glucose 70 - 99 mg/dL 159(H) 99 94  BUN 8 - 23 mg/dL 13 13 14   Creatinine 0.44 - 1.00 mg/dL 1.28(H) 1.01(H) 0.99  BUN/Creat Ratio 12 - 28 - - -  Sodium 135 - 145 mmol/L 138 142 141  Potassium 3.5 - 5.1 mmol/L 4.6 3.1(L) 3.6  Chloride 98 - 111 mmol/L 101 100 101  CO2 22 - 32 mmol/L 28 32 29  Calcium 8.9 - 10.3 mg/dL 8.2(L) 8.3(L) 8.3(L)    Assessment/Plan:   Principal Problem:   Abscess of abdominal cavity (HCC) Active Problems:   Permanent atrial fibrillation (HCC)   CHF (congestive heart failure) (HCC)   Chronic kidney disease (CKD), stage III (moderate)  Patient is a 86 year old female with past medical history significant for atrial fibrillation on warfarin,  hypertension, HFrEF, CKD stage IIIb, and recent appendiceal abscess status post drain placement who presented to the ER on 08/05/2019 with right lower quadrant abdominal pain and copious serosanguineous drainage from the drain site.  #Perforated appendicitis with bare appendiceal abscess and fistula: Patient presented to ER on 05/30/2019 with perforated appendicitis.  IR placed drain for abscess on 05/30/2019 and surgery remove drain in clinic on 07/12/2019.  Drain was replaced on 07/17/2019 after reaccumulation upsize on 08/05/2019 when patient presented with increased right lower quadrant pain and drainage from incision site. CT abdomen/pelvis confirmed persistent abscess with fistula. Patient received 1 dose of cefepime and metronidazole in ER, and was placed on Zosyn on admission. Surgery performed yesterday 4/14 *Discontinue zosyn, start oral augmentin per pharmacy consult *Penrose drain in place, will be pulled prior to discharge per surgery  # Atrial fibrillation: Patient with history of atrial fibrillation, on warfarin, EKG on admission demonstrates atrial fibrillation. *Transiton from heparin to warfarin per pharmacy consult *Diltiazem(Cardizem CD) 240 mg twice daily *Metoprolol (toprol-XL) 50 mg once daily  Hypothyroidism: *Continue thyroid 150 mg daily  PT/OT: Will consult following surgery Diet:Advanced to regular diet today DVT Ppx:On heparin, transitioned to warfarin Admit Status:Inpatient Dispo: Anticipated dischargepending clinical improvement  Jeanmarie Hubert, MD 08/08/2019, 6:51 AM

## 2019-08-08 NOTE — Evaluation (Signed)
Physical Therapy Evaluation Patient Details Name: Ashley Dunn MRN: [id number] DOB: 05/11/1925 Today's Date: 08/08/2019   History of Present Illness  Patient is a 86 year old female with past medical history significant for atrial fibrillation on warfarin, hypertension, HFrEF, CKD stage IIIb, and recent appendiceal abscess status post drain placement who presented to the ER on 08/05/2019 with right lower quadrant abdominal pain and copious serosanguineous drainage from the drain site. S/p laparoscopic appendectomy 08/07/2019.   Clinical Impression  Prior to admission, pt is fairly active and uses a Rollator for mobility. She currently presents with excellent pain control; ambulating 150 feet with a walker at a supervision level. HR stable; SpO2 94% on RA prior to mobility, 85% sitting EOB (poor waveform), and 90% post mobility. No PT follow up anticipated; will continue to follow acutely to promote ambulation.     Follow Up Recommendations No PT follow up;Supervision - Intermittent    Equipment Recommendations  None recommended by PT    Recommendations for Other Services       Precautions / Restrictions Precautions Precautions: Fall Restrictions Weight Bearing Restrictions: No      Mobility  Bed Mobility Overal bed mobility: Modified Independent                Transfers Overall transfer level: Modified independent                  Ambulation/Gait Ambulation/Gait assistance: Supervision Gait Distance (Feet): 150 Feet Assistive device: Rolling walker (2 wheeled) Gait Pattern/deviations: Step-through pattern;Decreased stride length     General Gait Details: Steady pace, no overt LOB  Stairs            Wheelchair Mobility    Modified Rankin (Stroke Patients Only)       Balance Overall balance assessment: Mild deficits observed, not formally tested                                           Pertinent Vitals/Pain Pain Assessment:  Faces Faces Pain Scale: Hurts a little bit Pain Location: abdomen Pain Descriptors / Indicators: Guarding Pain Intervention(s): Monitored during session    Home Living Family/patient expects to be discharged to:: Private residence Living Arrangements: Children Available Help at Discharge: Family;Available 24 hours/day Type of Home: House Home Access: Stairs to enter   [employer] of Steps: 1 Home Layout: One level Home Equipment: Walker - 4 wheels;Cane - single point      Prior Function Level of Independence: Independent with assistive device(s)         Comments: ambulates with use of a rollator, independent with ADLs, limited iADLs      Hand Dominance        Extremity/Trunk Assessment   Upper Extremity Assessment Upper Extremity Assessment: Overall WFL for tasks assessed    Lower Extremity Assessment Lower Extremity Assessment: Overall WFL for tasks assessed    Cervical / Trunk Assessment Cervical / Trunk Assessment: Kyphotic  Communication   Communication: HOH  Cognition Arousal/Alertness: Awake/alert Behavior During Therapy: WFL for tasks assessed/performed Overall Cognitive Status: Within Functional Limits for tasks assessed                                        General Comments      Exercises     Assessment/Plan  PT Assessment Patient needs continued PT services  PT Problem List Decreased strength;Decreased activity tolerance;Decreased balance;Decreased mobility;Pain       PT Treatment Interventions DME instruction;Gait training;Stair training;Functional mobility training;Therapeutic activities;Therapeutic exercise;Balance training;Patient/family education    PT Goals (Current goals can be found in the Care Plan section)  Acute Rehab PT Goals Patient Stated Goal: walk and do exercises PT Goal Formulation: All assessment and education complete, DC therapy    Frequency Min 3X/week   Barriers to discharge         Co-evaluation               AM-PAC PT "6 Clicks" Mobility  Outcome Measure Help needed turning from your back to your side while in a flat bed without using bedrails?: None Help needed moving from lying on your back to sitting on the side of a flat bed without using bedrails?: None Help needed moving to and from a bed to a chair (including a wheelchair)?: None Help needed standing up from a chair using your arms (e.g., wheelchair or bedside chair)?: None Help needed to walk in hospital room?: None Help needed climbing 3-5 steps with a railing? : A Little 6 Click Score: 23    End of Session   Activity Tolerance: Patient tolerated treatment well Patient left: in bed;with call bell/phone within reach   PT Visit Diagnosis: Pain;Difficulty in walking, not elsewhere classified (R26.2) Pain - part of body: (abdomen)    Time: 9458-5929 PT Time Calculation (min) (ACUTE ONLY): 21 min   Charges:   PT Evaluation $PT Eval Moderate Complexity: 1 Mod            [name], PT, DPT Acute Rehabilitation Services Pager [phone] Office [phone]   [name] 08/08/2019, 5:00 PM

## 2019-08-08 NOTE — Progress Notes (Signed)
Patient ID: Ashley Dunn, female   DOB: 03/18/1926, 86 y.o.   MRN: [id number]    1 Day Post-Op  Subjective: Patient doing very well this morning.  A little sore, but not having much pain otherwise.  She has been eating a solid diet and tolerating it well.  She had a small BM last night when she passed flatus.  O2 sats have dropped some into the high 80s, but her fingers are cold and her waveform is not always great.    ROS: See above, otherwise other systems negative  Objective: Vital signs in last 24 hours: Temp:  [97.5 F (36.4 C)-98 F (36.7 C)] 97.6 F (36.4 C) (04/15 0532) Pulse Rate:  [50-78] 75 (04/15 0532) Resp:  [11-18] 18 (04/15 0532) BP: (107-141)/(59-74) 107/64 (04/15 0532) SpO2:  [94 %-100 %] 96 % (04/15 0532) Weight:  [68.2 kg] 68.2 kg (04/14 0920) Last BM Date: 08/06/19  Intake/Output from previous day: 04/14 0701 - 04/15 0700 In: 2313.9 [P.O.:460; I.V.:1281.8; IV Piggyback:572.1] Out: 735 [Urine:725; Blood:10] Intake/Output this shift: No intake/output data recorded.  PE: Abd: soft, appropriately tender, penrose drain in place in open wound in RLQ.  Lap incisions are c/d/i.  +BS, ND  Lab Results:  Recent Labs    08/07/19 0511 08/08/19 0302  WBC 9.2 12.2*  HGB 12.8 12.7  HCT 39.7 39.1  PLT 276 261   BMET Recent Labs    08/07/19 0511 08/08/19 0302  NA 142 138  K 3.1* 4.6  CL 100 101  CO2 32 28  GLUCOSE 99 159*  BUN 13 13  CREATININE 1.01* 1.28*  CALCIUM 8.3* 8.2*   PT/INR Recent Labs    08/07/19 0511 08/08/19 0302  LABPROT 14.8 14.2  INR 1.2 1.1   CMP     Component Value Date/Time   NA 138 08/08/2019 0302   NA 142 06/17/2019 1112   K 4.6 08/08/2019 0302   CL 101 08/08/2019 0302   CO2 28 08/08/2019 0302   GLUCOSE 159 (H) 08/08/2019 0302   BUN 13 08/08/2019 0302   BUN 29 06/17/2019 1112   CREATININE 1.28 (H) 08/08/2019 0302   CALCIUM 8.2 (L) 08/08/2019 0302   PROT 6.7 08/05/2019 0536   PROT 7.2 02/06/2019 1039   ALBUMIN 2.4 (L)  08/05/2019 0536   ALBUMIN 4.1 02/06/2019 1039   AST 24 08/05/2019 0536   ALT 18 08/05/2019 0536   ALKPHOS 66 08/05/2019 0536   BILITOT 1.2 08/05/2019 0536   BILITOT 0.6 02/06/2019 1039   GFRNONAA 37 (L) 08/08/2019 0302   GFRAA 42 (L) 08/08/2019 0302   Lipase     Component Value Date/Time   LIPASE 28 08/05/2019 0536       Studies/Results: No results found.  Anti-infectives: Anti-infectives (From admission, onward)   Start     Dose/Rate Route Frequency Ordered Stop   08/05/19 1700  piperacillin-tazobactam (ZOSYN) IVPB 3.375 g     3.375 g 12.5 mL/hr over 240 Minutes Intravenous Every 8 hours 08/05/19 1009     08/05/19 0645  ceFEPIme (MAXIPIME) 2 g in sodium chloride 0.9 % 100 mL IVPB  Status:  Discontinued     2 g 200 mL/hr over 30 Minutes Intravenous Every 24 hours 08/05/19 0633 08/05/19 0946   08/05/19 0645  metroNIDAZOLE (FLAGYL) IVPB 500 mg  Status:  Discontinued     500 mg 100 mL/hr over 60 Minutes Intravenous  Once 08/05/19 0633 08/05/19 1021   08/05/19 0645  vancomycin (VANCOCIN) IVPB 1000 mg/200 mL premix  Status:  Discontinued     1,000 mg 200 mL/hr over 60 Minutes Intravenous Every 48 hours 08/05/19 0639 08/05/19 1610       Assessment/Plan A fib, may resume coumadin CHF HTN  POD 1, s/p lap appy for perforated appendicitis with periappendiceal abscess and fistula -patient is doing very well today -tolerating a regular diet -PT/OT to work with her today, but she does live at home with her daughter so she has great support.  Need to monitor O2 sats, but denies SOB and has no pulmonary issues at baseline.  Pulls [phone] on IS -leave penrose drains for now, may pull prior to DC -ok to resume coumadin  FEN - heart health VTE - heparin gtt, may start coumadin ID - zosyn   LOS: 3 days    Henreitta Cea , [facility] 08/08/2019, 8:20 AM Please see Amion for pager number during day hours 7:00am-4:30pm or 7:00am -11:30am on weekends

## 2019-08-08 NOTE — Plan of Care (Signed)
  Problem: Activity: Goal: Risk for activity intolerance will decrease Outcome: Progressing   Problem: Nutrition: Goal: Adequate nutrition will be maintained Outcome: Progressing   Problem: Elimination: Goal: Will not experience complications related to bowel motility Outcome: Progressing   

## 2019-08-08 NOTE — Progress Notes (Signed)
Pharmacy Antibiotic Note  Ashley Dunn is a 86 y.o. female admitted on 08/05/2019 with acute perforated appendicitis and large periappendiceal abscess w/ fistula   Pharmacy has been consulted for piperacillin/tazobactam dosing.  Discussed antibiotic and duration with PA given now s/p appendectomy with 2 drains in. Of note, patient had Cdiff in Feb 2021. All cultures still NGTD. WBC slightly up but likely post-op response.  Per PA, ok to narrow to amox/clav for 5 days. ClCr ~24 ml/min, will dose adjust amox/clav.  Plan: Discontinue pip/tazo Start amox/clav 500mg  Q12hr  End date 4/18 entered   Height: 5' (152.4 cm) Weight: 68.2 kg (150 lb 6.4 oz) IBW/kg (Calculated) : 45.5  Temp (24hrs), Avg:97.7 F (36.5 C), Min:97.5 F (36.4 C), Max:98 F (36.7 C)  Recent Labs  Lab 08/05/19 0536 08/05/19 0833 08/06/19 0833 08/07/19 0511 08/08/19 0302  WBC 12.3*  --  10.7* 9.2 12.2*  CREATININE 1.36*  --  0.99 1.01* 1.28*  LATICACIDVEN  --  1.3  --   --   --     Estimated Creatinine Clearance: 24.7 mL/min (A) (by C-G formula based on SCr of 1.28 mg/dL (H)).    No Known Allergies  Antimicrobials this admission: Cefepime 4/12 x 1 Flagyl 4/12 x 1 Zosyn 4/12 >>4/15 Amox/clav 4/15>>4/18  Microbiology results: 4/12 Abscess cx >> cancelled 4/12 COVID >> neg 4/12 BCx >> ngtd 4/13 MRSA screen + 3/24 (previous admit) drain abscess - proteus (pan-s), b.frag  Thank you for allowing pharmacy to be a part of this patient's care.  Benetta Spar, PharmD, BCPS, [facility] Clinical Pharmacist  Please check AMION for all [place] phone numbers After 10:00 PM, call [place] [phone]

## 2019-08-08 NOTE — Progress Notes (Addendum)
[place] for Heparin and warfarin Indication: atrial fibrillation  No Known Allergies  Patient Measurements: Height: 5' (152.4 cm) Weight: 68.2 kg (150 lb 6.4 oz) IBW/kg (Calculated) : 45.5 Heparin Dosing Weight: 62.3 kg  Vital Signs: Temp: 97.6 F (36.4 C) (04/15 0532) Temp Source: Oral (04/15 0532) BP: 107/64 (04/15 0532) Pulse Rate: 75 (04/15 0532)  Labs: Recent Labs    08/06/19 0833 08/06/19 0833 08/06/19 1601 08/07/19 0511 08/08/19 0302  HGB 13.1   < >  --  12.8 12.7  HCT 40.2  --   --  39.7 39.1  PLT 258  --   --  276 261  LABPROT 14.4  --   --  14.8 14.2  INR 1.1  --   --  1.2 1.1  HEPARINUNFRC 0.32  --  0.36  --  0.41  CREATININE 0.99  --   --  1.01* 1.28*   < > = values in this interval not displayed.    Estimated Creatinine Clearance: 24.7 mL/min (A) (by C-G formula based on SCr of 1.28 mg/dL (H)).   Assessment: 86 yr old female on warfarin PTA for history of afib (CHADS2VASc = 5) held for 4/14 appendectomy. Heparin was held at [facility] for procedure. Pharmacy is consulted to start heparin at 1800 on 4/14 and to start warfarin on 4/15 if hgb stable and tolerating PO.   Patient is s/p appendectomy with two Penrose drains placed into the subcutaneous abscess. Post-op H/H is not available. HL therapeutic on 1100 units/hr. INR 1.2. Ok to restart warfarin today per surgery. H/H, plt stable. Starting soft diet. INR 1.1.   Warfarin PTA dose was 2.5mg  MWFSu, 5mg  TTSa per anticoagulation clinic note 07/15/19.  Goal of Therapy:  Heparin level 0.3-0.7 units/ml Monitor platelets by anticoagulation protocol: Yes   Plan:  Continue heparin at 1100 units/hr  Warfarin 6mg  x1 today  Monitor daily INR, heparin level, CBC Monitor for sign/symptoms of bleeding Stop heparin when INR is 2 or greater  Benetta Spar, PharmD, BCPS, [facility] Clinical Pharmacist  Please check AMION for all [place] phone numbers After 10:00 PM, call [place]

## 2019-08-08 NOTE — Progress Notes (Signed)
ANTICOAGULATION CONSULT NOTE  Pharmacy Consult:  Heparin Indication: atrial fibrillation  No Known Allergies  Patient Measurements: Height: 5' (152.4 cm) Weight: 68.2 kg (150 lb 6.4 oz) IBW/kg (Calculated) : 45.5 Heparin Dosing Weight: 62.3 kg  Vital Signs: Temp: 97.7 F (36.5 C) (04/15 0137) Temp Source: Oral (04/15 0137) BP: 132/74 (04/15 0137) Pulse Rate: 75 (04/15 0137)  Labs: Recent Labs     0000 08/05/19 0536 08/05/19 6384 08/05/19 2235 08/06/19 6659 08/06/19 0833 08/06/19 1601 08/07/19 0511 08/08/19 0302  HGB   < > 14.8  --   --  13.1   < >  --  12.8 12.7  HCT   < > 44.2  --   --  40.2  --   --  39.7 39.1  PLT   < > 299  --   --  258  --   --  276 261  APTT  --   --  29  --   --   --   --   --   --   LABPROT   < >  --  13.4  --  14.4  --   --  14.8 14.2  INR   < >  --  1.0  --  1.1  --   --  1.2 1.1  HEPARINUNFRC  --   --   --    < > 0.32  --  0.36  --  0.41  CREATININE  --  1.36*  --   --  0.99  --   --  1.01*  --    < > = values in this interval not displayed.    Estimated Creatinine Clearance: 31.3 mL/min (A) (by C-G formula based on SCr of 1.01 mg/dL (H)).   Assessment: 86 yr old female on warfarin PTA for history of afib (CHADS2VASc = 5), held for 4/14 appendectomy.  Patient is s/p appendectomy with two Penrose drains placed into the subcutaneous abscess. Pharmacy consulted to restart heparin.  Heparin level is therapeutic; no bleeding reported.  Goal of Therapy:  Heparin level 0.3-0.7 units/ml Monitor platelets by anticoagulation protocol: Yes   Plan:  Continue heparin gtt at 1100 units/hr Daily heparin level and CBC  Stephannie Broner D. Mina Marble, PharmD, BCPS, [place] 08/08/2019, 4:16 AM

## 2019-08-09 DIAGNOSIS — I4891 Unspecified atrial fibrillation: Secondary | ICD-10-CM

## 2019-08-09 DIAGNOSIS — Z7901 Long term (current) use of anticoagulants: Secondary | ICD-10-CM

## 2019-08-09 DIAGNOSIS — I110 Hypertensive heart disease with heart failure: Secondary | ICD-10-CM

## 2019-08-09 DIAGNOSIS — I5020 Unspecified systolic (congestive) heart failure: Secondary | ICD-10-CM

## 2019-08-09 DIAGNOSIS — Z79899 Other long term (current) drug therapy: Secondary | ICD-10-CM

## 2019-08-09 DIAGNOSIS — Z79890 Hormone replacement therapy (postmenopausal): Secondary | ICD-10-CM

## 2019-08-09 DIAGNOSIS — E039 Hypothyroidism, unspecified: Secondary | ICD-10-CM

## 2019-08-09 DIAGNOSIS — K3533 Acute appendicitis with perforation and localized peritonitis, with abscess: Principal | ICD-10-CM

## 2019-08-09 DIAGNOSIS — Z978 Presence of other specified devices: Secondary | ICD-10-CM

## 2019-08-09 LAB — BASIC METABOLIC PANEL
Anion gap: 9 (ref 5–15)
BUN: 26 mg/dL — ABNORMAL HIGH (ref 8–23)
CO2: 28 mmol/L (ref 22–32)
Calcium: 8.5 mg/dL — ABNORMAL LOW (ref 8.9–10.3)
Chloride: 102 mmol/L (ref 98–111)
Creatinine, Ser: 1.22 mg/dL — ABNORMAL HIGH (ref 0.44–1.00)
GFR calc Af Amer: 45 mL/min — ABNORMAL LOW (ref >60)
GFR calc non Af Amer: 39 mL/min — ABNORMAL LOW (ref >60)
Glucose, Bld: 121 mg/dL — ABNORMAL HIGH (ref 70–99)
Potassium: 4.3 mmol/L (ref 3.5–5.1)
Sodium: 139 mmol/L (ref 135–145)

## 2019-08-09 LAB — CBC
HCT: 38.9 % (ref 36.0–46.0)
Hemoglobin: 12.5 g/dL (ref 12.0–15.0)
MCH: 30.6 pg (ref 26.0–34.0)
MCHC: 32.1 g/dL (ref 30.0–36.0)
MCV: 95.3 fL (ref 80.0–100.0)
Platelets: 278 10*3/uL (ref 150–400)
RBC: 4.08 MIL/uL (ref 3.87–5.11)
RDW: 17.4 % — ABNORMAL HIGH (ref 11.5–15.5)
WBC: 17.1 10*3/uL — ABNORMAL HIGH (ref 4.0–10.5)
nRBC: 0 % (ref 0.0–0.2)

## 2019-08-09 LAB — PROTIME-INR
INR: 1.1 (ref 0.8–1.2)
Prothrombin Time: 14.3 seconds (ref 11.4–15.2)

## 2019-08-09 LAB — HEPARIN LEVEL (UNFRACTIONATED): Heparin Unfractionated: 0.47 IU/mL (ref 0.30–0.70)

## 2019-08-09 MED ORDER — WARFARIN SODIUM 6 MG PO TABS: 6.0000 mg | ORAL_TABLET | Freq: Once | ORAL | Status: DC

## 2019-08-09 MED ORDER — WARFARIN SODIUM 6 MG PO TABS
6.0000 mg | ORAL_TABLET | Freq: Once | ORAL | Status: AC
  Administered 2019-08-09: 6 mg via ORAL
  Filled 2019-08-09 (×2): qty 1

## 2019-08-09 MED ORDER — LACTATED RINGERS IV SOLN: INTRAVENOUS | Status: DC

## 2019-08-09 MED ORDER — ACETAMINOPHEN 500 MG PO TABS: 1000.0000 mg | ORAL_TABLET | Freq: Four times a day (QID) | ORAL | Status: AC | PRN

## 2019-08-09 MED ORDER — OXYCODONE HCL 5 MG PO TABS: 5.0000 mg | ORAL_TABLET | Freq: Four times a day (QID) | ORAL | 0 refills | Status: DC | PRN

## 2019-08-09 MED ORDER — AMOXICILLIN-POT CLAVULANATE 500-125 MG PO TABS: 1.0000 | ORAL_TABLET | Freq: Two times a day (BID) | ORAL | 0 refills | Status: AC

## 2019-08-09 MED ORDER — SODIUM CHLORIDE 0.9 % IV SOLN: INTRAVENOUS | Status: AC

## 2019-08-09 MED FILL — AMOX-CLAV 500-125 MG TABLET: 500-125 | 5 days supply | Qty: 10 | Fill #0

## 2019-08-09 NOTE — Progress Notes (Signed)
PT Cancellation Note  Patient Details Name: Ashley Dunn MRN: [id number] DOB: 24-Jun-1925   Cancelled Treatment:    Reason Eval/Treat Not Completed: Other (comment).  On side of bed attempting to eat her meal, will re-attempt as time and pt allow.   Ramond Dial 08/09/2019, 12:09 PM  Mee Hives, PT MS Acute Rehab Dept. Number: [place] and [place]

## 2019-08-09 NOTE — Progress Notes (Signed)
  Date: 08/09/2019  Patient name: Ashley Dunn  Medical record number: [id number]  Date of birth: [birth year]        I have seen and evaluated this patient and I have discussed the plan of care with the house staff. Please see Dr. Darci Current note for complete details. I concur with his findings and plan for discharge today.  Patient will need close follow up to repeat her blood work, ensure she is doing well at home, remove drain and check INR.  This will be arranged for her today.  She will discharge to home.  Our therapy teams have seen her and recommend no PT or OT follow up at this time.   Sid Falcon, MD 08/09/2019, 2:30 PM

## 2019-08-09 NOTE — Discharge Instructions (Signed)
You may shower with your penrose drain in place.  Place a dry gauze and tape over this daily or when you get out of the shower.  Once this is removed you will need to pack this wound.  The nurses at our office should be showing you when you go get this removed.    [facility]  Please arrive at least 30 min before your appointment to complete your check in paperwork.  If you are unable to arrive 30 min prior to your appointment time we may have to cancel or reschedule you. LAPAROSCOPIC SURGERY: POST OP INSTRUCTIONS Always review your discharge instruction sheet given to you by the facility where your surgery was performed. IF YOU HAVE DISABILITY OR FAMILY LEAVE FORMS, YOU MUST BRING THEM TO THE OFFICE FOR PROCESSING.   DO NOT GIVE THEM TO YOUR DOCTOR.  PAIN CONTROL  1. First take acetaminophen (Tylenol) AND/or ibuprofen (Advil) to control your pain after surgery.  Follow directions on package.  Taking acetaminophen (Tylenol) and/or ibuprofen (Advil) regularly after surgery will help to control your pain and lower the amount of prescription pain medication you may need.  You should not take more than 4,000 mg (4 grams) of acetaminophen (Tylenol) in 24 hours.  You should not take ibuprofen (Advil), aleve, motrin, naprosyn or other NSAIDS if you have a history of stomach ulcers or chronic kidney disease.  2. A prescription for pain medication may be given to you upon discharge.  Take your pain medication as prescribed, if you still have uncontrolled pain after taking acetaminophen (Tylenol) or ibuprofen (Advil). 3. Use ice packs to help control pain. 4. If you need a refill on your pain medication, please contact your pharmacy.  They will contact our office to request authorization. Prescriptions will not be filled after 5pm or on week-ends.  HOME MEDICATIONS 5. Take your usually prescribed medications unless otherwise directed.  DIET 6. You should follow a light diet the  first few days after arrival home.  Be sure to include lots of fluids daily. Avoid fatty, fried foods.   CONSTIPATION 7. It is common to experience some constipation after surgery and if you are taking pain medication.  Increasing fluid intake and taking a stool softener (such as Colace) will usually help or prevent this problem from occurring.  A mild laxative (Milk of Magnesia or Miralax) should be taken according to package instructions if there are no bowel movements after 48 hours.  WOUND/INCISION CARE 8. Most patients will experience some swelling and bruising in the area of the incisions.  Ice packs will help.  Swelling and bruising can take several days to resolve.  9. Unless discharge instructions indicate otherwise, follow guidelines below  a. STERI-STRIPS - you may remove your outer bandages 48 hours after surgery, and you may shower at that time.  You have steri-strips (small skin tapes) in place directly over the incision.  These strips should be left on the skin for 7-10 days.   b. DERMABOND/SKIN GLUE - you may shower in 24 hours.  The glue will flake off over the next 2-3 weeks. 10. Any sutures or staples will be removed at the office during your follow-up visit.  ACTIVITIES 11. You may resume regular (light) daily activities beginning the next day--such as daily self-care, walking, climbing stairs--gradually increasing activities as tolerated.  You may have sexual intercourse when it is comfortable.  Refrain from any heavy lifting or straining until approved by your doctor. a. You  may drive when you are no longer taking prescription pain medication, you can comfortably wear a seatbelt, and you can safely maneuver your car and apply brakes.  FOLLOW-UP 12. You should see your doctor in the office for a follow-up appointment approximately 2-3 weeks after your surgery.  You should have been given your post-op/follow-up appointment when your surgery was scheduled.  If you did not receive  a post-op/follow-up appointment, make sure that you call for this appointment within a day or two after you arrive home to insure a convenient appointment time.   WHEN TO CALL YOUR DOCTOR: 1. Fever over 101.0 2. Inability to urinate 3. Continued bleeding from incision. 4. Increased pain, redness, or drainage from the incision. 5. Increasing abdominal pain  The clinic staff is available to answer your questions during regular business hours.  Please don't hesitate to call and ask to speak to one of the nurses for clinical concerns.  If you have a medical emergency, go to the nearest emergency room or call 911.  A surgeon from [facility] is always on call at the hospital. [address], [place], [place], [place]  [address] ? [address], [place], [place]   [address] [phone] ? [phone] ? FAX [phone]  ........Marland Kitchen   Managing Your Pain After Surgery Without Opioids    Thank you for participating in our program to help patients manage their pain after surgery without opioids. This is part of our effort to provide you with the best care possible, without exposing you or your family to the risk that opioids pose.  What pain can I expect after surgery? You can expect to have some pain after surgery. This is normal. The pain is typically worse the day after surgery, and quickly begins to get better. Many studies have found that many patients are able to manage their pain after surgery with Over-the-Counter (OTC) medications such as Tylenol and Motrin. If you have a condition that does not allow you to take Tylenol or Motrin, notify your surgical team.  How will I manage my pain? The best strategy for controlling your pain after surgery is around the clock pain control with Tylenol (acetaminophen) and Motrin (ibuprofen or Advil). Alternating these medications with each other allows you to maximize your pain control. In addition to Tylenol and Motrin, you can use  heating pads or ice packs on your incisions to help reduce your pain.  How will I alternate your regular strength over-the-counter pain medication? You will take a dose of pain medication every three hours. ; Start by taking 650 mg of Tylenol (2 pills of 325 mg) ; 3 hours later take 600 mg of Motrin (3 pills of 200 mg) ; 3 hours after taking the Motrin take 650 mg of Tylenol ; 3 hours after that take 600 mg of Motrin.   - 1 -  See example - if your first dose of Tylenol is at 12:00 PM   12:00 PM Tylenol 650 mg (2 pills of 325 mg)  3:00 PM Motrin 600 mg (3 pills of 200 mg)  6:00 PM Tylenol 650 mg (2 pills of 325 mg)  9:00 PM Motrin 600 mg (3 pills of 200 mg)  Continue alternating every 3 hours   We recommend that you follow this schedule around-the-clock for at least 3 days after surgery, or until you feel that it is no longer needed. Use the table on the last page of this handout to keep track of  the medications you are taking. Important: Do not take more than 3000mg  of Tylenol or 3200mg  of Motrin in a 24-hour period. Do not take ibuprofen/Motrin if you have a history of bleeding stomach ulcers, severe kidney disease, &/or actively taking a blood thinner  What if I still have pain? If you have pain that is not controlled with the over-the-counter pain medications (Tylenol and Motrin or Advil) you might have what we call "breakthrough" pain. You will receive a prescription for a small amount of an opioid pain medication such as Oxycodone, Tramadol, or Tylenol with Codeine. Use these opioid pills in the first 24 hours after surgery if you have breakthrough pain. Do not take more than 1 pill every 4-6 hours.  If you still have uncontrolled pain after using all opioid pills, don't hesitate to call our staff using the number provided. We will help make sure you are managing your pain in the best way possible, and if necessary, we can provide a prescription for additional pain  medication.   Day 1    Time  Name of Medication Number of pills taken  Amount of Acetaminophen  Pain Level   Comments  AM PM       AM PM       AM PM       AM PM       AM PM       AM PM       AM PM       AM PM       Total Daily amount of Acetaminophen Do not take more than  3,000 mg per day      Day 2    Time  Name of Medication Number of pills taken  Amount of Acetaminophen  Pain Level   Comments  AM PM       AM PM       AM PM       AM PM       AM PM       AM PM       AM PM       AM PM       Total Daily amount of Acetaminophen Do not take more than  3,000 mg per day      Day 3    Time  Name of Medication Number of pills taken  Amount of Acetaminophen  Pain Level   Comments  AM PM       AM PM       AM PM       AM PM          AM PM       AM PM       AM PM       AM PM       Total Daily amount of Acetaminophen Do not take more than  3,000 mg per day      Day 4    Time  Name of Medication Number of pills taken  Amount of Acetaminophen  Pain Level   Comments  AM PM       AM PM       AM PM       AM PM       AM PM       AM PM       AM PM       AM PM       Total Daily amount of  Acetaminophen Do not take more than  3,000 mg per day      Day 5    Time  Name of Medication Number of pills taken  Amount of Acetaminophen  Pain Level   Comments  AM PM       AM PM       AM PM       AM PM       AM PM       AM PM       AM PM       AM PM       Total Daily amount of Acetaminophen Do not take more than  3,000 mg per day       Day 6    Time  Name of Medication Number of pills taken  Amount of Acetaminophen  Pain Level  Comments  AM PM       AM PM       AM PM       AM PM       AM PM       AM PM       AM PM       AM PM       Total Daily amount of Acetaminophen Do not take more than  3,000 mg per day      Day 7    Time  Name of Medication Number of pills taken  Amount of Acetaminophen  Pain Level   Comments   AM PM       AM PM       AM PM       AM PM       AM PM       AM PM       AM PM       AM PM       Total Daily amount of Acetaminophen Do not take more than  3,000 mg per day        For additional information about how and where to safely dispose of unused opioid medications - [employer]  Disclaimer: This document contains information and/or instructional materials adapted from [place] for the typical patient with your condition. It does not replace medical advice from your health care provider because your experience may differ from that of the typical patient. Talk to your health care provider if you have any questions about this document, your condition or your treatment plan. Adapted from [place]

## 2019-08-09 NOTE — Progress Notes (Signed)
  Subjective:  Ashley Dunn was examined and evaluated at bedside this am. She mentions that she was told that she would stay inpatient until her INR is therapeutic. No acute complaints. Patient reports having bowel movements and urinating without difficulty.  Objective:   Vital Signs (last 24 hours): Vitals:   08/08/19 1357 08/08/19 1434 08/08/19 2023 08/09/19 0451  BP: 113/62 (!) 166/75 (!) 115/59 (!) 125/53  Pulse: 77 (!) 101 66 (!) 50  Resp: 18 15 17 15   Temp: 98 F (36.7 C) 100.1 F (37.8 C) 98.4 F (36.9 C) (!) 97.4 F (36.3 C)  TempSrc: Oral Oral Oral Oral  SpO2: 98% 93% 94% 97%  Weight:      Height:       Physical Exam: General Alert and answers questions appropriately, no acute distress  Cardiac Regular rate and rhythm, no murmurs, rubs, or gallops  Pulmonary Clear to auscultation bilaterally without wheezes, rhonchi, or rales    CBC Latest Ref Rng & Units 08/09/2019 08/08/2019 08/07/2019  WBC 4.0 - 10.5 K/uL 17.1(H) 12.2(H) 9.2  Hemoglobin 12.0 - 15.0 g/dL 12.5 12.7 12.8  Hematocrit 36.0 - 46.0 % 38.9 39.1 39.7  Platelets 150 - 400 K/uL 278 261 276   BMP Latest Ref Rng & Units 08/09/2019 08/08/2019 08/07/2019  Glucose 70 - 99 mg/dL 121(H) 159(H) 99  BUN 8 - 23 mg/dL 26(H) 13 13  Creatinine 0.44 - 1.00 mg/dL 1.22(H) 1.28(H) 1.01(H)  BUN/Creat Ratio 12 - 28 - - -  Sodium 135 - 145 mmol/L 139 138 142  Potassium 3.5 - 5.1 mmol/L 4.3 4.6 3.1(L)  Chloride 98 - 111 mmol/L 102 101 100  CO2 22 - 32 mmol/L 28 28 32  Calcium 8.9 - 10.3 mg/dL 8.5(L) 8.2(L) 8.3(L)    Assessment/Plan:   Principal Problem:   Abscess of abdominal cavity (HCC) Active Problems:   Permanent atrial fibrillation (HCC)   CHF (congestive heart failure) (HCC)   Chronic kidney disease (CKD), stage III (moderate)  Patient is a 86 year old female with past medical history significant for atrial fibrillation on warfarin, hypertension, HFrEF and recent appendiceal abscess status post drain placement who  presented to the ER on 08/05/2019 with right lower quadrant abdominal pain and copious serosanguineous drainage from the drain site.  #Perforated appendicitis with appendiceal abscess and fistula: Patient presented to the ER on 05/30/2019 with perforated appendicitis.  IR placed drain for abscess on 05/30/2019 and surgery remove drain in clinic on 07/12/2019.  Drain was replaced on 07/17/2019 after reaccumulation, upsized on 08/05/2019 when patient presented with increased right lower quadrant pain and drainage from incision site.  CT abdomen/pelvis confirmed persistent abscess with fistula.  Patient received 1 dose of cefepime and metronidazole in ER, was placed on Zosyn on admission.  Surgery was performed on 08/07/2019. *Continue patient on oral Augmentin, will plan for a 5-day course on discharge *Penrose drain in place, followup in 1 week with surgery clinic for removal  # Atrial fibrillation Patient with history of atrial fibrillation, on warfarin, EKG on admission demonstrates atrial fibrillation. Transiton from heparin to warfarin per pharmacy consult. Warfarin of 1.1 this AM. Will discharge on prior home warfarin regimen with instructions to followup with warfarin clinic within 1 week. *Diltiazem(Cardizem CD) 240 mg twice daily *Metoprolol (toprol-XL) 50 mg once daily  Hypothyroidism: *Continue thyroid 150 mg daily  PT/OT:No PT followup recommended, OT pending Diet:Soft diet DVT GXQ:JJHERDEY Admit Status:Inpatient Dispo: Anticipated dischargepending clinical improvement  Jeanmarie Hubert, MD 08/09/2019, 6:11 AM

## 2019-08-09 NOTE — Discharge Summary (Addendum)
Name: Ashley Dunn MRN: [id number] DOB: [birth year] 86 y.o. PCP: Ashley Moron, MD  Date of Admission: 08/05/2019  5:12 AM Date of Discharge:  Attending Physician: Ashley Falcon, MD  Discharge Diagnosis: 1. Perforated appendicitis with appendiceal abscess and fistula 2. Atrial fibrillation on warfarin  Discharge Medications: Allergies as of 08/09/2019   No Known Allergies     Medication List    TAKE these medications   acetaminophen 500 MG tablet Commonly known as: TYLENOL Take 2 tablets (1,000 mg total) by mouth every 6 (six) hours as needed for mild pain or fever.   amoxicillin-clavulanate 500-125 MG tablet Commonly known as: AUGMENTIN Take 1 tablet (500 mg total) by mouth every 12 (twelve) hours for 5 days.   diltiazem 240 MG 24 hr capsule Commonly known as: CARDIZEM CD Take 240 mg by mouth 2 (two) times daily.   metoprolol succinate 50 MG 24 hr tablet Commonly known as: TOPROL-XL Take 1 tablet (50 mg total) by mouth in the morning and at bedtime.   oxyCODONE 5 MG immediate release tablet Commonly known as: Oxy IR/ROXICODONE Take 1 tablet (5 mg total) by mouth every 6 (six) hours as needed for moderate pain or severe pain.   thyroid 30 MG tablet Commonly known as: Armour Thyroid Take 1 tablet (30 mg total) by mouth daily before breakfast.   thyroid 120 MG tablet Commonly known as: Armour Thyroid Take 1 tablet (120 mg total) by mouth daily before breakfast.   torsemide 20 MG tablet Commonly known as: DEMADEX Take 20 mg by mouth 2 (two) times daily.   warfarin 5 MG tablet Commonly known as: COUMADIN Take as directed. If you are unsure how to take this medication, talk to your nurse or doctor. Original instructions: Take 2.5-5 mg by mouth See admin instructions. Take 2.5 mg by mouth on Mon, Wed, Fri and Sun after supper. Take 5 MG Tuesday, Thursday and Saturday after supper.       Disposition and follow-up:   Ashley Dunn was discharged from [facility] in Stable condition.  At the hospital follow up visit please address:  1.  Please assess patient for improvement/resolution of abdominal pain. Please assess for signs of postoperative infection. Ensure patient has followup with INR clinic for warfarin management. Ensure patient has followed up with surgical clinic for penrose drain removal (to occur on 08/14/19).  2.  Labs / imaging needed at time of follow-up: None  3.  Pending labs/ test needing follow-up: None  Follow-up Appointments: Follow-up Information    Ashley Ok, MD Follow up on 08/29/2019.   Specialty: General Surgery Why: 11:50am arrival time 11:20 for paperwork and check in process.  please bring photo ID and insurance card Contact information: [place] [address] [phone]        Surgery, [place] [facility] Follow up on 08/14/2019.   Specialty: General Surgery Why: 2:00pm, arrive by 1:30pm for paperwork and check in process.  This will be a nurse only visit to have your penrose drain removed. Contact information: [address] [place] [address] [phone]        Ashley Mormon, MD. Call.   Specialties: Cardiology, Radiology Why: Call for an appointment to occur within 1 week to have your INR checked Contact information: [place] [address] [phone]           Hospital Course by problem list:  # Perforated appendicitis  with appendiceal abscess and fistula:  Patient is a 86 year old female with past medical history significant for atrial fibrillation on warfarin, hypertension, HFrEF, and recent appendiceal abscess status post drain placement who presented to the ER on 08/05/2019 with right lower quadrant abdominal pain and copious serosanguineous drainage from drain site. Patient initially presented to the ER on 05/30/2019 and was found to have perforated appendicitis. IR placed drain for abscess on  05/30/2019 and surgery remove drain in clinic on 07/12/2019. The drain was replaced on 07/17/2019 after reaccumulation, and this drain was upsized on this admission (08/05/2019) when patient presented with increased right lower quadrant pain and drainage from incision site. Patient initially received 1 dose of cefepime and metronidazole in ER, and was then placed on Zosyn, receiving IV antibiotics for total of 3 days, until 4/14 at which time laparoscopic appendectomy was performed. Patient's postoperative course was without significant event, was discharged to complete total 7-day course of oral Augmentin. Patient was discharged with Penrose drain in place with instructions to followup in surgical clinic on 08/14/19 for removal.  # Atrial fibrillation on warfarin: Patient with history of atrial fibrillation on warfarin, warfarin managed by cardiology clinic. Patient was placed on heparin in anticipation of surgery and then transitioned back to warfarin postoperatively. INR was 1.1 on day of discharge (08/09/2019). Patient was discharged to continue home warfarin dosing schedule - 2.5 mg Monday, Wednesday, Friday, and Sunday and 5 mg on Tuesday, Thursday, and Saturday.  Patient was discharged with instructions to follow-up with INR clinic within 1 week.  Discharge Vitals:   BP (!) 125/53 (BP Location: Left Arm)   Pulse (!) 50   Temp (!) 97.4 F (36.3 C) (Oral)   Resp 15   Ht 5' (1.524 m)   Wt 68.2 kg   SpO2 97%   BMI 29.37 kg/m   Pertinent Labs, Studies, and Procedures:  CBC Latest Ref Rng & Units 08/09/2019 08/08/2019 08/07/2019  WBC 4.0 - 10.5 K/uL 17.1(H) 12.2(H) 9.2  Hemoglobin 12.0 - 15.0 g/dL 12.5 12.7 12.8  Hematocrit 36.0 - 46.0 % 38.9 39.1 39.7  Platelets 150 - 400 K/uL 278 261 276   BMP Latest Ref Rng & Units 08/09/2019 08/08/2019 08/07/2019  Glucose 70 - 99 mg/dL 121(H) 159(H) 99  BUN 8 - 23 mg/dL 26(H) 13 13  Creatinine 0.44 - 1.00 mg/dL 1.22(H) 1.28(H) 1.01(H)  BUN/Creat Ratio 12 - 28 - -  -  Sodium 135 - 145 mmol/L 139 138 142  Potassium 3.5 - 5.1 mmol/L 4.3 4.6 3.1(L)  Chloride 98 - 111 mmol/L 102 101 100  CO2 22 - 32 mmol/L 28 28 32  Calcium 8.9 - 10.3 mg/dL 8.5(L) 8.2(L) 8.3(L)   CT Abdomen/Pelvis With Contrast (08/05/19): IMPRESSION: 1. Pigtail drainage catheter appears located within a dilated appendix or large periappendiceal abscess, which is again associated with a fistulous tract which drains through the right lateral abdominal wall musculature to the overlying skin surface. This overall appears very similar to the recent prior CT 07/30/2019. 2. Pleural-based nodule in the posterior aspect of the right lower lobe measuring 2.8 x 1.0 cm. This appears roughly stable in size dating back to 05/29/2019. The stability is reassuring, however, the possibility of neoplasm is not excluded. Close attention on follow-up imaging is recommended, with repeat noncontrast chest CT in 3-6 months to continue surveillance of this lesion. 3. Aortic atherosclerosis, in addition to at least 2 vessel coronary artery disease. 4. Cardiomegaly with biatrial dilatation. 5. Additional incidental findings, as above.  Discharge Instructions: Discharge Instructions    Call MD for:  difficulty breathing, headache or visual disturbances   Complete by: As directed    Call MD for:  redness, tenderness, or signs of infection (pain, swelling, redness, odor or green/yellow discharge around incision site)   Complete by: As directed    Call MD for:  temperature >100.4   Complete by: As directed    Diet - low sodium heart healthy   Complete by: As directed    Discharge instructions   Complete by: As directed    Please see additional instructions from surgery. You have been provided with a 5 day course of augmentin which is an antibiotic to help treat any residual infection. Please followup with your cardiologist to have your INR checked and warfarin dose adjusted if needed. Please tell the  cardiologists office that you were instructed to followup within 1 week to have this checked.  Thank you for allowing [place] to be part of your medical care!   Increase activity slowly   Complete by: As directed       Signed: Jeanmarie Hubert, MD 08/09/2019, 11:13 AM

## 2019-08-09 NOTE — Progress Notes (Signed)
Patient ID: Ashley Dunn, female   DOB: 1925/12/03, 86 y.o.   MRN: [id number]    2 Days Post-Op  Subjective: Patient feeling well today.  Hoping to go home.  Tolerating a regular diet. Having minimal pain.  ROS: See above, otherwise other systems negative  Objective: Vital signs in last 24 hours: Temp:  [97.4 F (36.3 C)-100.1 F (37.8 C)] 97.4 F (36.3 C) (04/16 0451) Pulse Rate:  [50-101] 50 (04/16 0451) Resp:  [15-18] 15 (04/16 0451) BP: (113-166)/(53-75) 125/53 (04/16 0451) SpO2:  [93 %-98 %] 97 % (04/16 0451) Last BM Date: 08/08/19  Intake/Output from previous day: 04/15 0701 - 04/16 0700 In: 1650.3 [P.O.:1320; I.V.:330.3] Out: -  Intake/Output this shift: Total I/O In: 360 [P.O.:360] Out: -   PE: Abd: soft, wound is clean with penrose in place, +BS, appropriately tender, incisions are c/d/i  Lab Results:  Recent Labs    08/08/19 0302 08/09/19 0349  WBC 12.2* 17.1*  HGB 12.7 12.5  HCT 39.1 38.9  PLT 261 278   BMET Recent Labs    08/08/19 0302 08/09/19 0349  NA 138 139  K 4.6 4.3  CL 101 102  CO2 28 28  GLUCOSE 159* 121*  BUN 13 26*  CREATININE 1.28* 1.22*  CALCIUM 8.2* 8.5*   PT/INR Recent Labs    08/08/19 0302 08/09/19 0349  LABPROT 14.2 14.3  INR 1.1 1.1   CMP     Component Value Date/Time   NA 139 08/09/2019 0349   NA 142 06/17/2019 1112   K 4.3 08/09/2019 0349   CL 102 08/09/2019 0349   CO2 28 08/09/2019 0349   GLUCOSE 121 (H) 08/09/2019 0349   BUN 26 (H) 08/09/2019 0349   BUN 29 06/17/2019 1112   CREATININE 1.22 (H) 08/09/2019 0349   CALCIUM 8.5 (L) 08/09/2019 0349   PROT 6.7 08/05/2019 0536   PROT 7.2 02/06/2019 1039   ALBUMIN 2.4 (L) 08/05/2019 0536   ALBUMIN 4.1 02/06/2019 1039   AST 24 08/05/2019 0536   ALT 18 08/05/2019 0536   ALKPHOS 66 08/05/2019 0536   BILITOT 1.2 08/05/2019 0536   BILITOT 0.6 02/06/2019 1039   GFRNONAA 39 (L) 08/09/2019 0349   GFRAA 45 (L) 08/09/2019 0349   Lipase     Component Value  Date/Time   LIPASE 28 08/05/2019 0536       Studies/Results: No results found.  Anti-infectives: Anti-infectives (From admission, onward)   Start     Dose/Rate Route Frequency Ordered Stop   08/08/19 1000  amoxicillin-clavulanate (AUGMENTIN) 500-125 MG per tablet 500 mg     1 tablet Oral Every 12 hours 08/08/19 0903 08/12/19 0959   08/05/19 1700  piperacillin-tazobactam (ZOSYN) IVPB 3.375 g  Status:  Discontinued     3.375 g 12.5 mL/hr over 240 Minutes Intravenous Every 8 hours 08/05/19 1009 08/08/19 0903   08/05/19 0645  ceFEPIme (MAXIPIME) 2 g in sodium chloride 0.9 % 100 mL IVPB  Status:  Discontinued     2 g 200 mL/hr over 30 Minutes Intravenous Every 24 hours 08/05/19 0633 08/05/19 0946   08/05/19 0645  metroNIDAZOLE (FLAGYL) IVPB 500 mg  Status:  Discontinued     500 mg 100 mL/hr over 60 Minutes Intravenous  Once 08/05/19 0633 08/05/19 1021   08/05/19 0645  vancomycin (VANCOCIN) IVPB 1000 mg/200 mL premix  Status:  Discontinued     1,000 mg 200 mL/hr over 60 Minutes Intravenous Every 48 hours 08/05/19 0639 08/05/19 9924  Assessment/Plan A fib, may resume coumadin CHF HTN  POD 2, s/p lap appy for perforated appendicitis with periappendiceal abscess and fistula -patient is doing very well today -tolerating a regular diet -no PT/OT follow up warranted -leave penrose drains for now, may pull prior to DC -WBC up some today, but likely post surgical inflammatory response as patient is AF and doing well.  5 days of post op abx course should be sufficient unless something changes -patient is surgically stable for DC home when felt medically stable  FEN -heart health VTE -heparin gtt, coumadin, INR 1.1 today ID -Augmentin   LOS: 4 days    Henreitta Cea , [facility] 08/09/2019, 8:59 AM Please see Amion for pager number during day hours 7:00am-4:30pm or 7:00am -11:30am on weekends

## 2019-08-09 NOTE — Evaluation (Addendum)
Occupational Therapy Evaluation and Discharge Patient Details Name: Ashley Dunn MRN: [id number] DOB: 02/27/1926 Today's Date: 08/09/2019    History of Present Illness Patient is a 86 year old female with past medical history significant for atrial fibrillation on warfarin, hypertension, HFrEF, CKD stage IIIb, and recent appendiceal abscess status post drain placement who presented to the ER on 08/05/2019 with right lower quadrant abdominal pain and copious serosanguineous drainage from the drain site. S/p laparoscopic appendectomy 08/07/2019.    Clinical Impression   PTA patient independent with ADLs, limited IADLs using Rollator for mobility. Admitted for above and presenting near baseline level with ADLs, functional mobility and transfers.  She requires supervision for transfers and mobility for IV line mgmt only using RW, supervision for LB ADLs and min assist for toileting (after +BM).  She has good support from daughter at home, who can assist as needed 24/7.  Patient with no further acute OT needs or after dc home.  OT will sign off. Thank you for this referral.      Follow Up Recommendations  No OT follow up;Supervision - Intermittent    Equipment Recommendations  None recommended by OT    Recommendations for Other Services       Precautions / Restrictions Precautions Precautions: Fall Restrictions Weight Bearing Restrictions: No      Mobility Bed Mobility Overal bed mobility: Modified Independent                Transfers Overall transfer level: Needs assistance Equipment used: Rolling walker (2 wheeled) Transfers: Sit to/from Stand Sit to Stand: Supervision         General transfer comment: for line mgmt only    Balance Overall balance assessment: Mild deficits observed, not formally tested                                         ADL either performed or assessed with clinical judgement   ADL Overall ADL's : Needs assistance/impaired      Grooming: Supervision/safety;Standing   Upper Body Bathing: Set up;Sitting   Lower Body Bathing: Supervison/ safety;Sit to/from stand   Upper Body Dressing : Supervision/safety;Sitting   Lower Body Dressing: Supervision/safety;Sit to/from stand   Toilet Transfer: Supervision/safety;Ambulation;RW   Toileting- Clothing Manipulation and Hygiene: Minimal assistance;Sit to/from stand Toileting - Clothing Manipulation Details (indicate cue type and reason): for hygiene after +BM     Functional mobility during ADLs: Supervision/safety;Rolling walker;Cueing for safety       Vision Baseline Vision/History: Wears glasses Vision Assessment?: No apparent visual deficits     Perception     Praxis      Pertinent Vitals/Pain Pain Assessment: Faces Faces Pain Scale: Hurts a little bit Pain Location: abdomen Pain Descriptors / Indicators: Guarding Pain Intervention(s): Monitored during session;Repositioned     Hand Dominance     Extremity/Trunk Assessment Upper Extremity Assessment Upper Extremity Assessment: Overall WFL for tasks assessed   Lower Extremity Assessment Lower Extremity Assessment: Defer to PT evaluation   Cervical / Trunk Assessment Cervical / Trunk Assessment: Kyphotic   Communication Communication Communication: HOH   Cognition Arousal/Alertness: Awake/alert Behavior During Therapy: WFL for tasks assessed/performed Overall Cognitive Status: Within Functional Limits for tasks assessed                                     General  Comments       Exercises     Shoulder Instructions      Home Living Family/patient expects to be discharged to:: Private residence Living Arrangements: Children Available Help at Discharge: Family;Available 24 hours/day Type of Home: House Home Access: Stairs to enter [employer] of Steps: 1   Home Layout: One level     Bathroom Shower/Tub: Occupational psychologist: Standard      Home Equipment: Environmental consultant - 4 wheels;Cane - single point;Bedside commode   Additional Comments: uses 3:1 as shower chair      Prior Functioning/Environment Level of Independence: Independent with assistive device(s)        Comments: ambulates with use of a rollator, independent with ADLs, limited iADLs         OT Problem List: Decreased activity tolerance;Pain      OT Treatment/Interventions:      OT Goals(Current goals can be found in the care plan section) Acute Rehab OT Goals Patient Stated Goal: to get home OT Goal Formulation: With patient  OT Frequency:     Barriers to D/C:            Co-evaluation              AM-PAC OT "6 Clicks" Daily Activity     Outcome Measure Help from another person eating meals?: None Help from another person taking care of personal grooming?: A Little Help from another person toileting, which includes using toliet, bedpan, or urinal?: A Little Help from another person bathing (including washing, rinsing, drying)?: A Little Help from another person to put on and taking off regular upper body clothing?: None Help from another person to put on and taking off regular lower body clothing?: A Little 6 Click Score: 20   End of Session Equipment Utilized During Treatment: Rolling walker Nurse Communication: Mobility status  Activity Tolerance: Patient tolerated treatment well Patient left: in chair;with call bell/phone within reach  OT Visit Diagnosis: Other abnormalities of gait and mobility (R26.89);Pain Pain - part of body: (abd)                Time: 0762-2633 OT Time Calculation (min): 18 min Charges:  OT General Charges $OT Visit: 1 Visit OT Evaluation $OT Eval Low Complexity: 1 Low  Jolaine Artist, OT Acute Rehabilitation Services Pager [phone] Office [phone]   Delight Stare 08/09/2019, 8:59 AM

## 2019-08-09 NOTE — Care Management Important Message (Signed)
Important Message  Patient Details  Name: Ashley Dunn MRN: [id number] Date of Birth: [birth year]   Medicare Important Message Given:  Yes     Patriciaann Rabanal Montine Circle 08/09/2019, 4:21 PM

## 2019-08-09 NOTE — Progress Notes (Addendum)
[place] for Heparin and warfarin Indication: atrial fibrillation  No Known Allergies  Patient Measurements: Height: 5' (152.4 cm) Weight: 68.2 kg (150 lb 6.4 oz) IBW/kg (Calculated) : 45.5 Heparin Dosing Weight: 62.3 kg  Vital Signs: Temp: 97.4 F (36.3 C) (04/16 0451) Temp Source: Oral (04/16 0451) BP: 125/53 (04/16 0451) Pulse Rate: 50 (04/16 0451)  Labs: Recent Labs    08/06/19 1601 08/07/19 0511 08/07/19 0511 08/08/19 0302 08/09/19 0349  HGB  --  12.8   < > 12.7 12.5  HCT  --  39.7  --  39.1 38.9  PLT  --  276  --  261 278  LABPROT  --  14.8  --  14.2 14.3  INR  --  1.2  --  1.1 1.1  HEPARINUNFRC 0.36  --   --  0.41 0.47  CREATININE  --  1.01*  --  1.28* 1.22*   < > = values in this interval not displayed.    Estimated Creatinine Clearance: 25.9 mL/min (A) (by C-G formula based on SCr of 1.22 mg/dL (H)).   Assessment: 86 yr old female on warfarin PTA for history of afib (CHADS2VASc = 5) held for 4/14 appendectomy. Heparin was held at [facility] for procedure. Pharmacy is consulted to start heparin at 1800 on 4/14 and to start warfarin on 4/15 if hgb stable and tolerating PO.   Patient is s/p appendectomy with two Penrose drains placed into the subcutaneous abscess. Post-op H/H is not available. HL continues to be therapeutic on 1100 units/hr. INR 1.1 after 1 warfarin dose. H/H, plt stable. Eating 100% soft diet.  Noted possible mild-mod interaction with amox/clav.   Warfarin PTA dose was 2.5mg  MWFSu, 5mg  TTSa per anticoagulation clinic note 07/15/19.  Goal of Therapy:  Heparin level 0.3-0.7 units/ml Monitor platelets by anticoagulation protocol: Yes   Plan:  Continue heparin at 1100 units/hr  Warfarin 6mg  x1 today  Monitor daily INR, heparin level, CBC Monitor for sign/symptoms of bleeding Stop heparin when INR is 2 or greater If discharged today, suggest resume home dose of 2.5mg  MWFSu, 5mg  TTSa with INR follow up within 1  week  Benetta Spar, PharmD, BCPS, [place] Pharmacist  Please check AMION for all [place] phone numbers After 10:00 PM, call [place]

## 2019-08-09 NOTE — Progress Notes (Signed)
Ralene Bathe to be D/C'd  per MD order. Discussed with the patient and all questions fully answered.  VSS, Skin clean, dry and intact without evidence of skin break down, no evidence of skin tears noted.  IV catheter discontinued intact. Site without signs and symptoms of complications. Dressing and pressure applied.  An After Visit Summary was printed and given to the patient. Patient received prescription.  D/c education completed with patient/family including follow up instructions, medication list, d/c activities limitations if indicated, with other d/c instructions as indicated by MD - patient able to verbalize understanding, all questions fully answered.   Patient instructed to return to ED, call 911, or call MD for any changes in condition.   Patient to be escorted via [place], and D/C home via private auto.

## 2019-08-09 NOTE — Plan of Care (Signed)

## 2019-08-10 LAB — CULTURE, BLOOD (ROUTINE X 2)
Culture: NO GROWTH
Culture: NO GROWTH
Special Requests: ADEQUATE

## 2019-08-12 ENCOUNTER — Other Ambulatory Visit: Payer: Self-pay | Admitting: Family Medicine

## 2019-08-12 NOTE — Telephone Encounter (Signed)
Medication Refill - Medication: armothyroid both prescriptions.   Has the patient contacted their pharmacy? Yes.   Pts daughter states that the pharmacy needs a new prescription written as DAW. Please advise.  (Agent: If no, request that the patient contact the pharmacy for the refill.) (Agent: If yes, when and what did the pharmacy advise?)  Preferred Pharmacy (with phone number or street name):  [facility] [place], [place] - [address]  [address] [place] [address]  Phone: [phone] Fax: [phone]  Not a 24 hour pharmacy; exact hours not known.     Agent: Please be advised that RX refills may take up to 3 business days. We ask that you follow-up with your pharmacy.

## 2019-08-13 ENCOUNTER — Telehealth: Payer: Self-pay

## 2019-08-13 NOTE — Telephone Encounter (Signed)
Pt was calling wrong office, she meant to call PCP, spoke to pt's Daughter.

## 2019-08-14 ENCOUNTER — Telehealth: Payer: Self-pay

## 2019-08-14 LAB — SURGICAL PATHOLOGY

## 2019-08-14 NOTE — Telephone Encounter (Signed)
Spoke with daughter advised Dr. Nolon Rod called [facility] and left verbal to dispense Amour thyroid DAW1 and if they had questions or concerns to contact our office.  Daughter appreciative of call back and will check with them.

## 2019-08-14 NOTE — Telephone Encounter (Signed)
Per Dr Nolon Rod, she spoke to patient's daughter about the medication Armour Thyroid it has been sent to pharmacy. It has been done. I had already spoke to Frannie at [place] before speaking to Dr Nolon Rod.

## 2019-08-14 NOTE — Telephone Encounter (Signed)
Pt still does not have this med. Her daughter is getting frustrated. Please advise these scripts have to be a DAW1. They can not be generic.

## 2019-08-15 ENCOUNTER — Telehealth: Payer: Self-pay | Admitting: Internal Medicine

## 2019-08-15 ENCOUNTER — Telehealth: Payer: Self-pay

## 2019-08-15 NOTE — Telephone Encounter (Signed)
I spoke with patient, Ashley Dunn on the phone. After verification of two identifiers, I informed patient of the pathology results from her appendectomy. 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 Rosendo Gros and her Primary Care Doctor for further recommendations  I faxed the discharge summary and pathology results to the office of Dr. Delia Chimes at [phone]. Per pathology report, Dr. Rosendo Gros has already been notified.  Ashley Hubert, MD 08/15/2019, 2:22 PM

## 2019-08-15 NOTE — Telephone Encounter (Signed)
No notes required.

## 2019-08-16 ENCOUNTER — Other Ambulatory Visit: Payer: Self-pay

## 2019-08-16 ENCOUNTER — Ambulatory Visit: Payer: Medicare Other | Admitting: Cardiology

## 2019-08-16 DIAGNOSIS — I480 Paroxysmal atrial fibrillation: Secondary | ICD-10-CM

## 2019-08-16 LAB — POCT INR: INR: 1.4 — AB (ref 2.0–3.0)

## 2019-08-16 NOTE — Progress Notes (Signed)
Goal: 2.0-3.0 Indication: Afib Today's INR: 1.4 Current dose: 5 mg T,Th,Sa, 2.5 mg all other days (Resumed post op on 4/21) New dose: Same  Next INR check: 10 days  Vernell Leep, MD

## 2019-08-26 ENCOUNTER — Ambulatory Visit: Payer: Medicare Other | Admitting: Pharmacist

## 2019-08-26 ENCOUNTER — Other Ambulatory Visit: Payer: Self-pay

## 2019-08-26 DIAGNOSIS — I480 Paroxysmal atrial fibrillation: Secondary | ICD-10-CM

## 2019-08-26 LAB — POCT INR: INR: 1.9 — AB (ref 2.0–3.0)

## 2019-08-26 NOTE — Patient Instructions (Signed)
INR below goal. Continue current dose of 5 mg on Tues, Thurs and Sat and 2.5 mg all other days. Recheck INR in 2 weeks

## 2019-08-26 NOTE — Progress Notes (Signed)
Anticoagulation Management Ashley Dunn is a 86 y.o. female who reports to the clinic for monitoring of warfarin treatment.    Indication: atrial fibrillation CHA2DS2 Vasc Score 5 (age >55, Female, CHF hx, HTN hx), HAS-BLED 1 (Age >70)   Duration: indefinite Supervising physician: [facility] Visit History:  Patient does not report signs/symptoms of bleeding or thromboembolism   Other recent changes: No changes in diet, medications, lifestyle. S/p laparoscopic appendectomy on 08/07/19. Resumed previous maintenance warfarin dose day off the surgery. Per telephone note from 4/22, appendectomy biopsy was found to be cancerous. Pt waiting for follow up work up.   Anticoagulation Episode Summary    Current INR goal:  2.0-3.0  TTR:  36.7 % (3.5 mo)  Next INR check:  09/10/2019  INR from last check:  1.9 (08/26/2019)  Weekly max warfarin dose:    Target end date:  Indefinite  INR check location:    Preferred lab:    Send INR reminders to:     Indications   Atrial fibrillation (HCC) [I48.91]       Comments:          No Known Allergies  Current Outpatient Medications:  .  acetaminophen (TYLENOL) 500 MG tablet, Take 2 tablets (1,000 mg total) by mouth every 6 (six) hours as needed for mild pain or fever., Disp: , Rfl:  .  diltiazem (CARDIZEM CD) 240 MG 24 hr capsule, Take 240 mg by mouth 2 (two) times daily. , Disp: , Rfl:  .  metoprolol succinate (TOPROL-XL) 50 MG 24 hr tablet, Take 1 tablet (50 mg total) by mouth in the morning and at bedtime., Disp: 180 tablet, Rfl: 2 .  oxyCODONE (OXY IR/ROXICODONE) 5 MG immediate release tablet, Take 1 tablet (5 mg total) by mouth every 6 (six) hours as needed for moderate pain or severe pain., Disp: 10 tablet, Rfl: 0 .  thyroid (ARMOUR THYROID) 120 MG tablet, Take 1 tablet (120 mg total) by mouth daily before breakfast., Disp: 90 tablet, Rfl: 0 .  thyroid (ARMOUR THYROID) 30 MG tablet, Take 1 tablet (30 mg total) by  mouth daily before breakfast., Disp: 90 tablet, Rfl: 0 .  torsemide (DEMADEX) 20 MG tablet, Take 20 mg by mouth 2 (two) times daily. , Disp: , Rfl:  .  warfarin (COUMADIN) 5 MG tablet, Take 2.5-5 mg by mouth See admin instructions. Take 2.5 mg by mouth on Mon, Wed, Fri and Sun after supper. Take 5 MG Tuesday, Thursday and Saturday after supper., Disp: , Rfl:  Past Medical History:  Diagnosis Date  . Arthritis   . Atrial fibrillation ([place])   . Cataract   . CHF (congestive heart failure) ([place])   . Hypertension   . Osteoporosis   . Thyroid disease     ASSESSMENT  Recent Results: The most recent result is correlated with 25 mg per week:  Lab Results  Component Value Date   INR 1.9 (A) 08/26/2019   INR 1.4 (A) 08/16/2019   INR 1.1 08/09/2019    Anticoagulation Dosing: Description   INR below goal. Continue current dose of 5 mg on Tues, Thurs and Sat and 2.5 mg all other days. Recheck INR in 2 weeks       INR today: Subtherapeutic. INR trending up following held doses post-op. Pt was previously maintained on current dose. Will continue current dose and recheck INR in 2 weeks. If INR remains below goal, will consider dose increase.   PLAN Weekly dose was unchanged. Continue  current dose of 5 mg every Tues, Thurs, Sat and 2.5 mg all other days. Recheck in 2 weeks  Patient Instructions  INR below goal. Continue current dose of 5 mg on Tues, Thurs and Sat and 2.5 mg all other days. Recheck INR in 2 weeks   Patient advised to contact clinic or seek medical attention if signs/symptoms of bleeding or thromboembolism occur.  Patient verbalized understanding by repeating back information and was advised to contact me if further medication-related questions arise.   Follow-up Return in about 15 days (around 09/10/2019).  [name], PharmD  15 minutes spent face-to-face with the patient during the encounter. 50% of time spent on education, including signs/sx bleeding and clotting,  as well as food and drug interactions with warfarin. 50% of time was spent on fingerprick POC INR sample collection,processing, results determination, and documentation

## 2019-09-10 ENCOUNTER — Other Ambulatory Visit: Payer: Self-pay

## 2019-09-10 ENCOUNTER — Ambulatory Visit: Payer: Medicare Other | Admitting: Pharmacist

## 2019-09-10 DIAGNOSIS — I480 Paroxysmal atrial fibrillation: Secondary | ICD-10-CM

## 2019-09-10 LAB — POCT INR: INR: 1.9 — AB (ref 2.0–3.0)

## 2019-09-10 NOTE — Progress Notes (Signed)
Anticoagulation Management Ashley Dunn is a 86 y.o. female who reports to the clinic for monitoring of warfarin treatment.    Indication: atrial fibrillation CHA2DS2 Vasc Score 5 (age >23, Female, CHF hx, HTN hx), HAS-BLED 1 (Age >34)   Duration: indefinite Supervising physician: [facility] Visit History:  Patient does not report signs/symptoms of bleeding or thromboembolism   Other recent changes: No changes in diet, medications, lifestyle. S/p laparoscopic appendectomy on 08/07/19. Resumed previous maintenance warfarin dose day off the surgery. Per telephone note from 4/22, appendectomy biopsy was found to be cancerous. Pt discussed with provider. Choosing no surgical intervention and to continue monitoring. Daughter stated that pt has had more energy recently and improved mood.   Anticoagulation Episode Summary    Current INR goal:  2.0-3.0  TTR:  32.1 % (4 mo)  Next INR check:  09/24/2019  INR from last check:  1.9 (09/10/2019)  Weekly max warfarin dose:    Target end date:  Indefinite  INR check location:    Preferred lab:    Send INR reminders to:     Indications   Atrial fibrillation (HCC) [I48.91]       Comments:          No Known Allergies  Current Outpatient Medications:  .  acetaminophen (TYLENOL) 500 MG tablet, Take 2 tablets (1,000 mg total) by mouth every 6 (six) hours as needed for mild pain or fever., Disp: , Rfl:  .  diltiazem (CARDIZEM CD) 240 MG 24 hr capsule, Take 240 mg by mouth 2 (two) times daily. , Disp: , Rfl:  .  metoprolol succinate (TOPROL-XL) 50 MG 24 hr tablet, Take 1 tablet (50 mg total) by mouth in the morning and at bedtime., Disp: 180 tablet, Rfl: 2 .  oxyCODONE (OXY IR/ROXICODONE) 5 MG immediate release tablet, Take 1 tablet (5 mg total) by mouth every 6 (six) hours as needed for moderate pain or severe pain., Disp: 10 tablet, Rfl: 0 .  thyroid (ARMOUR THYROID) 120 MG tablet, Take 1 tablet (120 mg total) by mouth  daily before breakfast., Disp: 90 tablet, Rfl: 0 .  thyroid (ARMOUR THYROID) 30 MG tablet, Take 1 tablet (30 mg total) by mouth daily before breakfast., Disp: 90 tablet, Rfl: 0 .  torsemide (DEMADEX) 20 MG tablet, Take 20 mg by mouth 2 (two) times daily. , Disp: , Rfl:  .  warfarin (COUMADIN) 5 MG tablet, Take 2.5-5 mg by mouth See admin instructions. Take 2.5 mg by mouth on Mon, Wed, Fri and Sun after supper. Take 5 MG Tuesday, Thursday and Saturday after supper., Disp: , Rfl:  Past Medical History:  Diagnosis Date  . Arthritis   . Atrial fibrillation ([place])   . Cataract   . CHF (congestive heart failure) ([place])   . Hypertension   . Osteoporosis   . Thyroid disease     ASSESSMENT  Recent Results: The most recent result is correlated with 25 mg per week:  Lab Results  Component Value Date   INR 1.9 (A) 09/10/2019   INR 1.9 (A) 08/26/2019   INR 1.4 (A) 08/16/2019    Anticoagulation Dosing: Description   INR below goal. Increase dose to 2.5 mg every Mon, Wed, Fri and 5 mg all other days. Recheck INR in 2 weeks       INR today: Subtherapeutic. INR continues to be below range for the past two checks. Denies any changes in medications, lifestyle, or diet. Warrants dose increase. Will continue close  monitoring and follow up.   PLAN Weekly dose was increased by 10%. New dose of 2.5 mg every Mon, Wed, Fri and 5 mg all other days. Recheck in 2 weeks  Patient Instructions  INR below goal. Increase dose to 2.5 mg every Mon, Wed, Fri and 5 mg all other days. Recheck INR in 2 weeks   Patient advised to contact clinic or seek medical attention if signs/symptoms of bleeding or thromboembolism occur.  Patient verbalized understanding by repeating back information and was advised to contact me if further medication-related questions arise.   Follow-up Return in about 2 weeks (around 09/24/2019).  [name], PharmD  15 minutes spent face-to-face with the patient during the encounter.  50% of time spent on education, including signs/sx bleeding and clotting, as well as food and drug interactions with warfarin. 50% of time was spent on fingerprick POC INR sample collection,processing, results determination, and documentation

## 2019-09-10 NOTE — Patient Instructions (Signed)
INR below goal. Increase dose to 2.5 mg every Mon, Wed, Fri and 5 mg all other days. Recheck INR in 2 weeks

## 2019-09-19 ENCOUNTER — Telehealth: Payer: Self-pay | Admitting: Family Medicine

## 2019-09-24 ENCOUNTER — Other Ambulatory Visit: Payer: Self-pay

## 2019-09-24 ENCOUNTER — Ambulatory Visit: Payer: Medicare Other | Admitting: Pharmacist

## 2019-09-24 DIAGNOSIS — Z5181 Encounter for therapeutic drug level monitoring: Secondary | ICD-10-CM

## 2019-09-24 DIAGNOSIS — Z7901 Long term (current) use of anticoagulants: Secondary | ICD-10-CM

## 2019-09-24 DIAGNOSIS — I480 Paroxysmal atrial fibrillation: Secondary | ICD-10-CM

## 2019-09-24 LAB — POCT INR: INR: 2.5 (ref 2.0–3.0)

## 2019-09-24 NOTE — Progress Notes (Signed)
Anticoagulation Management Ashley Dunn is a 86 y.o. female who reports to the clinic for monitoring of warfarin treatment.    Indication: atrial fibrillation CHA2DS2 Vasc Score 5 (age >35, Female, CHF hx, HTN hx), HAS-BLED 1 (Age >44)   Duration: indefinite Supervising physician: [facility] Visit History:  Patient does not report signs/symptoms of bleeding or thromboembolism   Other recent changes: No changes in diet, medications, lifestyle.     Anticoagulation Episode Summary    Current INR goal:  2.0-3.0  TTR:  37.4 % (4.5 mo)  Next INR check:  10/15/2019  INR from last check:  2.5 (09/24/2019)  Weekly max warfarin dose:    Target end date:  Indefinite  INR check location:    Preferred lab:    Send INR reminders to:     Indications   Atrial fibrillation (HCC) [I48.91]       Comments:          No Known Allergies  Current Outpatient Medications:  .  acetaminophen (TYLENOL) 500 MG tablet, Take 2 tablets (1,000 mg total) by mouth every 6 (six) hours as needed for mild pain or fever., Disp: , Rfl:  .  diltiazem (CARDIZEM CD) 240 MG 24 hr capsule, Take 240 mg by mouth 2 (two) times daily. , Disp: , Rfl:  .  metoprolol succinate (TOPROL-XL) 50 MG 24 hr tablet, Take 1 tablet (50 mg total) by mouth in the morning and at bedtime., Disp: 180 tablet, Rfl: 2 .  oxyCODONE (OXY IR/ROXICODONE) 5 MG immediate release tablet, Take 1 tablet (5 mg total) by mouth every 6 (six) hours as needed for moderate pain or severe pain., Disp: 10 tablet, Rfl: 0 .  thyroid (ARMOUR THYROID) 120 MG tablet, Take 1 tablet (120 mg total) by mouth daily before breakfast., Disp: 90 tablet, Rfl: 0 .  thyroid (ARMOUR THYROID) 30 MG tablet, Take 1 tablet (30 mg total) by mouth daily before breakfast., Disp: 90 tablet, Rfl: 0 .  torsemide (DEMADEX) 20 MG tablet, Take 20 mg by mouth 2 (two) times daily. , Disp: , Rfl:  .  warfarin (COUMADIN) 5 MG tablet, Take 2.5-5 mg by mouth See admin  instructions. Take 2.5 mg by mouth on Mon, Wed, Fri and Sun after supper. Take 5 MG Tuesday, Thursday and Saturday after supper., Disp: , Rfl:  Past Medical History:  Diagnosis Date  . Arthritis   . Atrial fibrillation ([place])   . Cataract   . CHF (congestive heart failure) ([place])   . Hypertension   . Osteoporosis   . Thyroid disease     ASSESSMENT  Recent Results: The most recent result is correlated with 27.5 mg per week:  Lab Results  Component Value Date   INR 2.5 09/24/2019   INR 1.9 (A) 09/10/2019   INR 1.9 (A) 08/26/2019    Anticoagulation Dosing: Description   INR at goal. Continue current dose of 2.5 mg every Mon, Wed, Fri and 5 mg all other days. Recheck INR in 3 weeks       INR today: Therapeutic, following dose increase last week. Denies any other changes in medications, lifestyle, or diet. Reports to be tolerating her dose increase well w/o any ADRs. Daughter who is the main transport for pt will be out of town in 2 weeks. Follow up in 3 weeks when daughter returns.   PLAN Weekly dose was unchanged. Continue taking 2.5 mg every Mon, Wed, Fri and 5 mg all other days. Recheck in 3  weeks  Patient Instructions  INR at goal. Continue current dose of 2.5 mg every Mon, Wed, Fri and 5 mg all other days. Recheck INR in 3 weeks   Patient advised to contact clinic or seek medical attention if signs/symptoms of bleeding or thromboembolism occur.  Patient verbalized understanding by repeating back information and was advised to contact me if further medication-related questions arise.   Follow-up Return in about 3 weeks (around 10/15/2019).  [name], PharmD  15 minutes spent face-to-face with the patient during the encounter. 50% of time spent on education, including signs/sx bleeding and clotting, as well as food and drug interactions with warfarin. 50% of time was spent on fingerprick POC INR sample collection,processing, results determination, and documentation

## 2019-09-24 NOTE — Patient Instructions (Signed)
INR at goal. Continue current dose of 2.5 mg every Mon, Wed, Fri and 5 mg all other days. Recheck INR in 3 weeks

## 2019-09-26 NOTE — Telephone Encounter (Signed)
I called pt's daughter and left her a message that she would need to fill a release out or she could give me a verbal auth and I can release her mom's records.

## 2019-09-30 ENCOUNTER — Other Ambulatory Visit: Payer: Self-pay

## 2019-09-30 MED ORDER — TORSEMIDE 20 MG PO TABS: 20.0000 mg | ORAL_TABLET | Freq: Two times a day (BID) | ORAL | 3 refills | Status: DC

## 2019-10-02 ENCOUNTER — Other Ambulatory Visit: Payer: Self-pay

## 2019-10-02 ENCOUNTER — Telehealth: Payer: Self-pay

## 2019-10-02 MED ORDER — TORSEMIDE 20 MG PO TABS: 20.0000 mg | ORAL_TABLET | Freq: Two times a day (BID) | ORAL | 3 refills | Status: DC

## 2019-10-15 ENCOUNTER — Other Ambulatory Visit: Payer: Self-pay

## 2019-10-15 ENCOUNTER — Ambulatory Visit: Payer: Medicare Other | Admitting: Pharmacist

## 2019-10-15 DIAGNOSIS — Z5181 Encounter for therapeutic drug level monitoring: Secondary | ICD-10-CM | POA: Insufficient documentation

## 2019-10-15 DIAGNOSIS — I4891 Unspecified atrial fibrillation: Secondary | ICD-10-CM

## 2019-10-15 LAB — POCT INR: INR: 1.8 — AB (ref 2.0–3.0)

## 2019-10-15 NOTE — Patient Instructions (Signed)
INR below goal. Increase weekly dose to 2.5 mg every Wed, Fri and 5 mg all other days. Recheck INR in 2 weeks

## 2019-10-15 NOTE — Progress Notes (Signed)
Anticoagulation Management Ashley Dunn is a 86 y.o. female who reports to the clinic for monitoring of warfarin treatment.    Indication: atrial fibrillation CHA2DS2 Vasc Score 5 (age >30, Female, CHF hx, HTN hx), HAS-BLED 1 (Age >27)   Duration: indefinite Supervising physician: [facility] Visit History:  Patient does not report signs/symptoms of bleeding or thromboembolism   Other recent changes: No changes in diet, medications, lifestyle. Does report some increased appetite recently.   Anticoagulation Episode Summary    Current INR goal:  2.0-3.0  TTR:  42.0 % (5.2 mo)  Next INR check:  10/29/2019  INR from last check:  1.8 (10/15/2019)  Weekly max warfarin dose:    Target end date:  Indefinite  INR check location:    Preferred lab:    Send INR reminders to:     Indications   Atrial fibrillation (HCC) [I48.91] Monitoring for long-term anticoagulant use [Z51.81 Z79.01]       Comments:          No Known Allergies  Current Outpatient Medications:  .  acetaminophen (TYLENOL) 500 MG tablet, Take 2 tablets (1,000 mg total) by mouth every 6 (six) hours as needed for mild pain or fever., Disp: , Rfl:  .  diltiazem (CARDIZEM CD) 240 MG 24 hr capsule, Take 240 mg by mouth 2 (two) times daily. , Disp: , Rfl:  .  metoprolol succinate (TOPROL-XL) 50 MG 24 hr tablet, Take 1 tablet (50 mg total) by mouth in the morning and at bedtime., Disp: 180 tablet, Rfl: 2 .  oxyCODONE (OXY IR/ROXICODONE) 5 MG immediate release tablet, Take 1 tablet (5 mg total) by mouth every 6 (six) hours as needed for moderate pain or severe pain., Disp: 10 tablet, Rfl: 0 .  thyroid (ARMOUR THYROID) 120 MG tablet, Take 1 tablet (120 mg total) by mouth daily before breakfast., Disp: 90 tablet, Rfl: 0 .  thyroid (ARMOUR THYROID) 30 MG tablet, Take 1 tablet (30 mg total) by mouth daily before breakfast., Disp: 90 tablet, Rfl: 0 .  torsemide (DEMADEX) 20 MG tablet, Take 1 tablet (20 mg  total) by mouth 2 (two) times daily., Disp: 90 tablet, Rfl: 3 .  warfarin (COUMADIN) 5 MG tablet, Take 2.5-5 mg by mouth See admin instructions. Take 2.5 mg by mouth on Mon, Wed, Fri and Sun after supper. Take 5 MG Tuesday, Thursday and Saturday after supper., Disp: , Rfl:  Past Medical History:  Diagnosis Date  . Arthritis   . Atrial fibrillation ([place])   . Cataract   . CHF (congestive heart failure) ([place])   . Hypertension   . Osteoporosis   . Thyroid disease     ASSESSMENT  Recent Results: The most recent result is correlated with 27.5 mg per week:  Lab Results  Component Value Date   INR 1.8 (A) 10/15/2019   INR 2.5 09/24/2019   INR 1.9 (A) 09/10/2019    Anticoagulation Dosing: Description   INR below goal. Increase weekly dose to 2.5 mg every Wed, Fri and 5 mg all other days. Recheck INR in 2 weeks       INR today: Subtherapeutic. Pt denies any missed doses, changes in her VitK intake, any new medications, lifestyle changes. Unclear reason for decreased INR reading today. Weekly dose recently increased 2 visits ago. Warrants continued weekly dose increase to ensure pt remains within therapeutic range.   PLAN Weekly dose was increased by 9.1%. Increase dose to 2.5 mg every Wed, Fri and 5  mg all other days. Recheck in 2 weeks  Patient Instructions  INR below goal. Increase weekly dose to 2.5 mg every Wed, Fri and 5 mg all other days. Recheck INR in 2 weeks   Patient advised to contact clinic or seek medical attention if signs/symptoms of bleeding or thromboembolism occur.  Patient verbalized understanding by repeating back information and was advised to contact me if further medication-related questions arise.   Follow-up Return in about 2 weeks (around 10/29/2019).  [name], PharmD  15 minutes spent face-to-face with the patient during the encounter. 50% of time spent on education, including signs/sx bleeding and clotting, as well as food and drug interactions  with warfarin. 50% of time was spent on fingerprick POC INR sample collection,processing, results determination, and documentation

## 2019-10-19 ENCOUNTER — Emergency Department (HOSPITAL_COMMUNITY): Payer: Medicare Other

## 2019-10-19 ENCOUNTER — Inpatient Hospital Stay (HOSPITAL_COMMUNITY)
Admit: 2019-10-19 | Discharge: 2019-10-23 | DRG: 291 | Disposition: A | Discharge status: Home / Self Care | Payer: Medicare Other | Attending: Internal Medicine | Admitting: Internal Medicine

## 2019-10-19 ENCOUNTER — Other Ambulatory Visit: Payer: Self-pay

## 2019-10-19 ENCOUNTER — Encounter (HOSPITAL_COMMUNITY): Payer: Self-pay | Admitting: Emergency Medicine

## 2019-10-19 DIAGNOSIS — N1831 Chronic kidney disease, stage 3a: Secondary | ICD-10-CM | POA: Diagnosis present

## 2019-10-19 DIAGNOSIS — I272 Pulmonary hypertension, unspecified: Secondary | ICD-10-CM | POA: Diagnosis present

## 2019-10-19 DIAGNOSIS — I130 Hypertensive heart and chronic kidney disease with heart failure and stage 1 through stage 4 chronic kidney disease, or unspecified chronic kidney disease: Secondary | ICD-10-CM | POA: Diagnosis not present

## 2019-10-19 DIAGNOSIS — Z8249 Family history of ischemic heart disease and other diseases of the circulatory system: Secondary | ICD-10-CM

## 2019-10-19 DIAGNOSIS — I5032 Chronic diastolic (congestive) heart failure: Secondary | ICD-10-CM

## 2019-10-19 DIAGNOSIS — N183 Chronic kidney disease, stage 3 unspecified: Secondary | ICD-10-CM | POA: Diagnosis present

## 2019-10-19 DIAGNOSIS — R609 Edema, unspecified: Secondary | ICD-10-CM

## 2019-10-19 DIAGNOSIS — M810 Age-related osteoporosis without current pathological fracture: Secondary | ICD-10-CM | POA: Diagnosis present

## 2019-10-19 DIAGNOSIS — C181 Malignant neoplasm of appendix: Secondary | ICD-10-CM | POA: Diagnosis present

## 2019-10-19 DIAGNOSIS — I4892 Unspecified atrial flutter: Secondary | ICD-10-CM | POA: Diagnosis present

## 2019-10-19 DIAGNOSIS — Z9049 Acquired absence of other specified parts of digestive tract: Secondary | ICD-10-CM

## 2019-10-19 DIAGNOSIS — Z823 Family history of stroke: Secondary | ICD-10-CM

## 2019-10-19 DIAGNOSIS — I10 Essential (primary) hypertension: Secondary | ICD-10-CM | POA: Diagnosis not present

## 2019-10-19 DIAGNOSIS — R0902 Hypoxemia: Secondary | ICD-10-CM | POA: Diagnosis not present

## 2019-10-19 DIAGNOSIS — E0590 Thyrotoxicosis, unspecified without thyrotoxic crisis or storm: Secondary | ICD-10-CM | POA: Diagnosis present

## 2019-10-19 DIAGNOSIS — I5023 Acute on chronic systolic (congestive) heart failure: Secondary | ICD-10-CM | POA: Diagnosis present

## 2019-10-19 DIAGNOSIS — I5043 Acute on chronic combined systolic (congestive) and diastolic (congestive) heart failure: Secondary | ICD-10-CM | POA: Diagnosis present

## 2019-10-19 DIAGNOSIS — Z79899 Other long term (current) drug therapy: Secondary | ICD-10-CM

## 2019-10-19 DIAGNOSIS — Z20822 Contact with and (suspected) exposure to covid-19: Secondary | ICD-10-CM | POA: Diagnosis present

## 2019-10-19 DIAGNOSIS — Z83438 Family history of other disorder of lipoprotein metabolism and other lipidemia: Secondary | ICD-10-CM

## 2019-10-19 DIAGNOSIS — Z7901 Long term (current) use of anticoagulants: Secondary | ICD-10-CM

## 2019-10-19 DIAGNOSIS — Z66 Do not resuscitate: Secondary | ICD-10-CM | POA: Diagnosis present

## 2019-10-19 DIAGNOSIS — I081 Rheumatic disorders of both mitral and tricuspid valves: Secondary | ICD-10-CM | POA: Diagnosis present

## 2019-10-19 DIAGNOSIS — I4821 Permanent atrial fibrillation: Secondary | ICD-10-CM | POA: Diagnosis present

## 2019-10-19 LAB — PROTIME-INR
INR: 1.6 — ABNORMAL HIGH (ref 0.8–1.2)
Prothrombin Time: 18.6 seconds — ABNORMAL HIGH (ref 11.4–15.2)

## 2019-10-19 LAB — CBC
HCT: 41.9 % (ref 36.0–46.0)
Hemoglobin: 13.5 g/dL (ref 12.0–15.0)
MCH: 31.9 pg (ref 26.0–34.0)
MCHC: 32.2 g/dL (ref 30.0–36.0)
MCV: 99.1 fL (ref 80.0–100.0)
Platelets: 190 10*3/uL (ref 150–400)
RBC: 4.23 MIL/uL (ref 3.87–5.11)
RDW: 14.6 % (ref 11.5–15.5)
WBC: 12.9 10*3/uL — ABNORMAL HIGH (ref 4.0–10.5)
nRBC: 0 % (ref 0.0–0.2)

## 2019-10-19 LAB — HEPATIC FUNCTION PANEL
ALT: 33 U/L (ref 0–44)
AST: 35 U/L (ref 15–41)
Albumin: 3 g/dL — ABNORMAL LOW (ref 3.5–5.0)
Alkaline Phosphatase: 74 U/L (ref 38–126)
Bilirubin, Direct: 0.3 mg/dL — ABNORMAL HIGH (ref 0.0–0.2)
Indirect Bilirubin: 0.8 mg/dL (ref 0.3–0.9)
Total Bilirubin: 1.1 mg/dL (ref 0.3–1.2)
Total Protein: 6.9 g/dL (ref 6.5–8.1)

## 2019-10-19 LAB — BASIC METABOLIC PANEL
Anion gap: 11 (ref 5–15)
BUN: 39 mg/dL — ABNORMAL HIGH (ref 8–23)
CO2: 30 mmol/L (ref 22–32)
Calcium: 9 mg/dL (ref 8.9–10.3)
Chloride: 97 mmol/L — ABNORMAL LOW (ref 98–111)
Creatinine, Ser: 1.35 mg/dL — ABNORMAL HIGH (ref 0.44–1.00)
GFR calc Af Amer: 40 mL/min — ABNORMAL LOW (ref >60)
GFR calc non Af Amer: 34 mL/min — ABNORMAL LOW (ref >60)
Glucose, Bld: 159 mg/dL — ABNORMAL HIGH (ref 70–99)
Potassium: 3.6 mmol/L (ref 3.5–5.1)
Sodium: 138 mmol/L (ref 135–145)

## 2019-10-19 LAB — SARS CORONAVIRUS 2 BY RT PCR (HOSPITAL ORDER, PERFORMED IN ~~LOC~~ HOSPITAL LAB): SARS Coronavirus 2: NEGATIVE

## 2019-10-19 LAB — TROPONIN I (HIGH SENSITIVITY)
Troponin I (High Sensitivity): 12 ng/L (ref <18)
Troponin I (High Sensitivity): 11 ng/L (ref <18)

## 2019-10-19 LAB — LIPASE, BLOOD: Lipase: 25 U/L (ref 11–51)

## 2019-10-19 LAB — BRAIN NATRIURETIC PEPTIDE: B Natriuretic Peptide: 475.4 pg/mL — ABNORMAL HIGH (ref 0.0–100.0)

## 2019-10-19 MED ORDER — METOPROLOL SUCCINATE ER 25 MG PO TB24: 50.0000 mg | ORAL_TABLET | Freq: Once | ORAL | Status: DC

## 2019-10-19 MED ORDER — THIAMINE HCL 100 MG PO TABS
100.0000 mg | ORAL_TABLET | Freq: Every day | ORAL | Status: DC
  Administered 2019-10-20 – 2019-10-23 (×4): 100 mg via ORAL
  Filled 2019-10-19 (×4): qty 1

## 2019-10-19 MED ORDER — WARFARIN SODIUM 5 MG PO TABS
5.0000 mg | ORAL_TABLET | ORAL | Status: DC
  Administered 2019-10-20 – 2019-10-21 (×3): 5 mg via ORAL
  Filled 2019-10-19 (×3): qty 1

## 2019-10-19 MED ORDER — SODIUM CHLORIDE 0.9 % IV SOLN: 250.0000 mL | INTRAVENOUS | Status: DC | PRN

## 2019-10-19 MED ORDER — DILTIAZEM HCL ER COATED BEADS 240 MG PO CP24
240.0000 mg | ORAL_CAPSULE | Freq: Two times a day (BID) | ORAL | Status: DC
  Administered 2019-10-20 – 2019-10-22 (×5): 240 mg via ORAL
  Filled 2019-10-19 (×8): qty 1

## 2019-10-19 MED ORDER — IOHEXOL 350 MG/ML SOLN
100.0000 mL | Freq: Once | INTRAVENOUS | Status: AC | PRN
  Administered 2019-10-19: 58 mL via INTRAVENOUS

## 2019-10-19 MED ORDER — WARFARIN SODIUM 2.5 MG PO TABS: 2.5000 mg | ORAL_TABLET | ORAL | Status: DC

## 2019-10-19 MED ORDER — SODIUM CHLORIDE 0.9% FLUSH: 3.0000 mL | INTRAVENOUS | Status: DC | PRN

## 2019-10-19 MED ORDER — ACETAMINOPHEN 500 MG PO TABS: 1000.0000 mg | ORAL_TABLET | Freq: Four times a day (QID) | ORAL | Status: DC | PRN

## 2019-10-19 MED ORDER — ONDANSETRON HCL 4 MG/2ML IJ SOLN: 4.0000 mg | Freq: Four times a day (QID) | INTRAMUSCULAR | Status: DC | PRN

## 2019-10-19 MED ORDER — WARFARIN - PHYSICIAN DOSING INPATIENT: Freq: Every day | Status: DC

## 2019-10-19 MED ORDER — ACETAMINOPHEN 325 MG PO TABS: 650.0000 mg | ORAL_TABLET | ORAL | Status: DC | PRN

## 2019-10-19 MED ORDER — SODIUM CHLORIDE 0.9% FLUSH
3.0000 mL | Freq: Two times a day (BID) | INTRAVENOUS | Status: DC
  Administered 2019-10-20 – 2019-10-21 (×5): 3 mL via INTRAVENOUS

## 2019-10-19 MED ORDER — SODIUM CHLORIDE 0.9% FLUSH
3.0000 mL | Freq: Once | INTRAVENOUS | Status: AC
  Administered 2019-10-19: 3 mL via INTRAVENOUS

## 2019-10-19 MED ORDER — FUROSEMIDE 10 MG/ML IJ SOLN
40.0000 mg | Freq: Every day | INTRAMUSCULAR | Status: DC
  Administered 2019-10-20 – 2019-10-21 (×3): 40 mg via INTRAVENOUS
  Filled 2019-10-19 (×3): qty 4

## 2019-10-19 MED ORDER — THYROID 120 MG PO TABS
120.0000 mg | ORAL_TABLET | Freq: Every day | ORAL | Status: DC
  Administered 2019-10-20 – 2019-10-23 (×4): 120 mg via ORAL
  Filled 2019-10-19 (×4): qty 1

## 2019-10-19 MED ORDER — POTASSIUM CHLORIDE CRYS ER 20 MEQ PO TBCR: 40.0000 meq | EXTENDED_RELEASE_TABLET | Freq: Once | ORAL | Status: DC

## 2019-10-19 MED ORDER — WARFARIN - PHARMACIST DOSING INPATIENT: Freq: Every day | Status: DC

## 2019-10-19 NOTE — Progress Notes (Signed)
ANTICOAGULATION CONSULT NOTE - Initial Consult  Pharmacy Consult for Coumadin Indication: atrial fibrillation  No Known Allergies  Patient Measurements:   Vital Signs: Temp: 98.6 F (37 C) (06/26 1858) Temp Source: Oral (06/26 1858) BP: 105/52 (06/26 2300) Pulse Rate: 67 (06/26 2300)  Labs: Recent Labs    10/19/19 1517 10/19/19 1747 10/19/19 2045  HGB 13.5  --   --   HCT 41.9  --   --   PLT 190  --   --   LABPROT  --   --  18.6*  INR  --   --  1.6*  CREATININE 1.35*  --   --   TROPONINIHS 12 11  --     Medical History: Past Medical History:  Diagnosis Date  . Arthritis   . Atrial fibrillation ([place])   . Cataract   . CHF (congestive heart failure) ([place])   . Hypertension   . Osteoporosis   . Thyroid disease     Assessment: 86yo female c/o indigestion, admitted for CHF exacerbation, to continue Coumadin for Afib; pt was seen in outpt [facility] clinic on 6/22 and had low INR and had dose adjustment; INR remains below goal w/ last dose taken 6/25.  Goal of Therapy:  INR 2-3   Plan:  Will continue new home regimen (5mg  daily except 2.5mg  on Wed/Fri) for now and give boosted dose if INR does not start trending up.  Wynona Neat, PharmD, BCPS  10/19/2019,11:17 PM

## 2019-10-19 NOTE — ED Notes (Signed)
Cardizem Held until morning per Dr. Alcario Drought

## 2019-10-19 NOTE — ED Notes (Signed)
Pt family, Clarise Cruz, notified of pt condition. She requested she be notified when pt gets upstairs.

## 2019-10-19 NOTE — ED Notes (Signed)
Pt oxugen was 89 on room air, [place] 2L applied, SPO2 improved to 94%

## 2019-10-19 NOTE — ED Provider Notes (Signed)
[facility] EMERGENCY DEPARTMENT Provider Note   CSN: [id number] Arrival date & time: 10/19/19  1449     History Chief Complaint  Patient presents with  . Chest Pain    Ashley Dunn is a 86 y.o. female.  The history is provided by the patient and medical records. No language interpreter was used.  Shortness of Breath Severity:  Severe Onset quality:  Gradual Timing:  Constant Progression:  Worsening Chronicity:  New Relieved by:  Nothing Worsened by:  Nothing Ineffective treatments:  None tried Associated symptoms: chest pain   Associated symptoms: no abdominal pain, no claudication, no cough, no diaphoresis, no fever, no headaches, no sputum production, no vomiting and no wheezing        Past Medical History:  Diagnosis Date  . Arthritis   . Atrial fibrillation ([place])   . Cataract   . CHF (congestive heart failure) ([place])   . Hypertension   . Osteoporosis   . Thyroid disease     Patient Active Problem List   Diagnosis Date Noted  . Monitoring for long-term anticoagulant use 10/15/2019  . Abscess of abdominal cavity ([place]) 08/05/2019  . Chronic kidney disease (CKD), stage III (moderate) 08/05/2019  . Pre-op evaluation 06/17/2019  . Hypertension   . Osteoporosis   . Thyroid disease   . CHF (congestive heart failure) ([place])   . Arthritis   . Appendicitis with abscess 05/29/2019  . Atrial fibrillation ([place]) 03/19/2019  . Permanent atrial fibrillation ([place]) 02/26/2019    Past Surgical History:  Procedure Laterality Date  . CESAREAN SECTION    . EYE SURGERY    . FRACTURE SURGERY    . IR CATHETER TUBE CHANGE  06/12/2019  . IR CATHETER TUBE CHANGE  08/05/2019  . IR RADIOLOGIST EVAL & MGMT  06/11/2019  . IR RADIOLOGIST EVAL & MGMT  06/25/2019  . IR RADIOLOGIST EVAL & MGMT  07/30/2019  . LAPAROSCOPIC APPENDECTOMY N/A 08/07/2019   Procedure: LAPAROSCOPIC  APPENDECTOMY;  Surgeon: Ralene Ok, MD;  Location: [facility];  Service: General;  Laterality:  N/A;  . LAPAROSCOPY N/A 08/07/2019   Procedure: Laparoscopy Diagnostic and Lysis of Adhesions;  Surgeon: Ralene Ok, MD;  Location: [facility];  Service: General;  Laterality: N/A;     OB History   No obstetric history on file.     Family History  Problem Relation Age of Onset  . Stroke Mother   . Cancer Father   . Hyperlipidemia Brother   . Healthy Daughter   . Healthy Son   . Heart disease Maternal Grandmother   . Heart disease Maternal Grandfather     Social History   Tobacco Use  . Smoking status: Never Smoker  . Smokeless tobacco: Never Used  Substance Use Topics  . Alcohol use: Never  . Drug use: Never    Home Medications Prior to Admission medications   Medication Sig Start Date End Date Taking? Authorizing Provider  acetaminophen (TYLENOL) 500 MG tablet Take 2 tablets (1,000 mg total) by mouth every 6 (six) hours as needed for mild pain or fever. 08/09/19   Saverio Danker, PA-C  diltiazem (CARDIZEM CD) 240 MG 24 hr capsule Take 240 mg by mouth 2 (two) times daily.  12/28/18   [provider]  metoprolol succinate (TOPROL-XL) 50 MG 24 hr tablet Take 1 tablet (50 mg total) by mouth in the morning and at bedtime. 07/25/19   Patwardhan, Reynold Bowen, MD  oxyCODONE (OXY IR/ROXICODONE) 5 MG immediate release tablet Take  1 tablet (5 mg total) by mouth every 6 (six) hours as needed for moderate pain or severe pain. 08/09/19   Saverio Danker, PA-C  thyroid [facility] THYROID) 120 MG tablet Take 1 tablet (120 mg total) by mouth daily before breakfast. 08/02/19   Forrest Moron, MD  thyroid (ARMOUR THYROID) 30 MG tablet Take 1 tablet (30 mg total) by mouth daily before breakfast. 08/02/19   Forrest Moron, MD  torsemide (DEMADEX) 20 MG tablet Take 1 tablet (20 mg total) by mouth 2 (two) times daily. 10/02/19   Patwardhan, Reynold Bowen, MD  warfarin (COUMADIN) 5 MG tablet Take 2.5-5 mg by mouth See admin instructions. Take 2.5 mg by mouth on Mon, Wed, Fri and Sun after supper. Take 5 MG  Tuesday, Thursday and Saturday after supper. 12/28/18   [provider]    Allergies    Patient has no known allergies.  Review of Systems   Review of Systems  Constitutional: Positive for fatigue. Negative for chills, diaphoresis and fever.  HENT: Negative for congestion.   Respiratory: Positive for chest tightness and shortness of breath. Negative for cough, sputum production, wheezing and stridor.   Cardiovascular: Positive for chest pain and leg swelling. Negative for palpitations and claudication.  Gastrointestinal: Negative for abdominal pain, constipation, diarrhea, nausea and vomiting.  Genitourinary: Negative for dysuria.  Musculoskeletal: Negative for back pain.  Neurological: Negative for light-headedness and headaches.  Psychiatric/Behavioral: Negative for agitation.  All other systems reviewed and are negative.   Physical Exam Updated Vital Signs BP 129/66 (BP Location: Right Arm)   Pulse 80   Temp 98.6 F (37 C) (Oral)   Resp 14   SpO2 94%   Physical Exam Vitals and nursing note reviewed.  Constitutional:      General: She is not in acute distress.    Appearance: She is well-developed. She is not ill-appearing, toxic-appearing or diaphoretic.  HENT:     Head: Normocephalic and atraumatic.  Eyes:     Conjunctiva/sclera: Conjunctivae normal.     Pupils: Pupils are equal, round, and reactive to light.  Cardiovascular:     Rate and Rhythm: Normal rate. Rhythm irregular.     Heart sounds: No murmur heard.   Pulmonary:     Effort: Pulmonary effort is normal. No respiratory distress.     Breath sounds: Examination of the right-middle field reveals rales. Examination of the left-middle field reveals rales. Examination of the right-lower field reveals rales. Examination of the left-lower field reveals rales. Rales present. No wheezing or rhonchi.  Abdominal:     Palpations: Abdomen is soft.     Tenderness: There is no abdominal tenderness.    Musculoskeletal:     Cervical back: Neck supple.     Right lower leg: No tenderness. Edema present.     Left lower leg: No tenderness. Edema present.  Skin:    General: Skin is warm and dry.     Capillary Refill: Capillary refill takes less than 2 seconds.     Findings: No erythema.  Neurological:     General: No focal deficit present.     Mental Status: She is alert.  Psychiatric:        Mood and Affect: Mood normal.     ED Results / Procedures / Treatments   Labs (all labs ordered are listed, but only abnormal results are displayed) Labs Reviewed  BASIC METABOLIC PANEL - Abnormal; Notable for the following components:      Result Value  Chloride 97 (*)    Glucose, Bld 159 (*)    BUN 39 (*)    Creatinine, Ser 1.35 (*)    GFR calc non Af Amer 34 (*)    GFR calc Af Amer 40 (*)    All other components within normal limits  CBC - Abnormal; Notable for the following components:   WBC 12.9 (*)    All other components within normal limits  PROTIME-INR - Abnormal; Notable for the following components:   Prothrombin Time 18.6 (*)    INR 1.6 (*)    All other components within normal limits  HEPATIC FUNCTION PANEL - Abnormal; Notable for the following components:   Albumin 3.0 (*)    Bilirubin, Direct 0.3 (*)    All other components within normal limits  BRAIN NATRIURETIC PEPTIDE - Abnormal; Notable for the following components:   B Natriuretic Peptide 475.4 (*)    All other components within normal limits  SARS CORONAVIRUS 2 BY RT PCR (HOSPITAL ORDER, [facility])  LIPASE, BLOOD  PROTIME-INR  BASIC METABOLIC PANEL  TROPONIN I (HIGH SENSITIVITY)  TROPONIN I (HIGH SENSITIVITY)    EKG EKG Interpretation  Date/Time:  Saturday October 19 2019 14:58:20 EDT Ventricular Rate:  73 PR Interval:    QRS Duration: 82 QT Interval:  360 QTC Calculation: 396 R Axis:   -179 Text Interpretation: Atrial fibrillation Right superior axis deviation Low voltage  QRS Cannot rule out Anteroseptal infarct , age undetermined Abnormal ECG When compared to prior, similar afib. No STEMI Confirmed by Antony Blackbird [phone]) on 10/19/2019 7:34:18 PM   Radiology DG Chest 2 View  Result Date: 10/19/2019 CLINICAL DATA:  Chest pain EXAM: CHEST - 2 VIEW COMPARISON:  05/29/2019 FINDINGS: Cardiomegaly. Aortic atherosclerosis. Mild vascular congestion. Bibasilar atelectasis or scarring. No effusions or overt edema. No acute bony abnormality. IMPRESSION: Cardiomegaly, vascular congestion. Bibasilar scarring or atelectasis. Electronically Signed   By: Rolm Baptise M.D.   On: 10/19/2019 15:39   CT Angio Chest PE W and/or Wo Contrast  Result Date: 10/19/2019 CLINICAL DATA:  Pain. Generalized weakness. EXAM: CT ANGIOGRAPHY CHEST WITH CONTRAST TECHNIQUE: Multidetector CT imaging of the chest was performed using the standard protocol during bolus administration of intravenous contrast. Multiplanar CT image reconstructions and MIPs were obtained to evaluate the vascular anatomy. CONTRAST:  30mL OMNIPAQUE IOHEXOL 350 MG/ML SOLN COMPARISON:  None. FINDINGS: Cardiovascular: Contrast injection is sufficient to demonstrate satisfactory opacification of the pulmonary arteries to the segmental level. There is no pulmonary embolus or evidence of right heart strain. The heart is enlarged. There is a trace pericardial effusion. Atherosclerotic changes are noted of the thoracic aorta without definite evidence for an aneurysm. There are coronary artery calcifications. Mediastinum/Nodes: --there are few mildly enlarged mediastinal lymph nodes, presumably reactive. -- No hilar lymphadenopathy. -- No axillary lymphadenopathy. -- No supraclavicular lymphadenopathy. -- Normal thyroid gland where visualized. -  Unremarkable esophagus. Lungs/Pleura: There is a trace left-sided pleural effusion. There is atelectasis at the lung bases. There is no pneumothorax. The trachea is unremarkable. Upper Abdomen:  Contrast bolus timing is not optimized for evaluation of the abdominal organs. The visualized portions of the organs of the upper abdomen are normal. Musculoskeletal: There is no acute displaced fracture. There is a subcutaneous soft tissue nodule involving the upper left anterior chest wall measuring approximately 1.8 cm (axial series 5, 15). This may represent a small sebaceous cyst. Review of the MIP images confirms the above findings. IMPRESSION: 1. No evidence  for pulmonary embolus. 2. Trace left-sided pleural effusion with atelectasis at the lung bases. 3. Cardiomegaly with a trace pericardial effusion. Aortic Atherosclerosis (ICD10-I70.0). Electronically Signed   By: Constance Holster M.D.   On: 10/19/2019 21:16    Procedures Procedures (including critical care time)  Medications Ordered in ED Medications  diltiazem (CARDIZEM CD) 24 hr capsule 240 mg (0 mg Oral Hold 10/19/19 2347)  warfarin (COUMADIN) tablet 2.5 mg (has no administration in time range)  warfarin (COUMADIN) tablet 5 mg (has no administration in time range)  thyroid (ARMOUR) tablet 120 mg (has no administration in time range)  thiamine tablet 100 mg (has no administration in time range)  Warfarin - Pharmacist Dosing Inpatient (has no administration in time range)  sodium chloride flush (NS) 0.9 % injection 3 mL (has no administration in time range)  sodium chloride flush (NS) 0.9 % injection 3 mL (has no administration in time range)  0.9 %  sodium chloride infusion (has no administration in time range)  acetaminophen (TYLENOL) tablet 650 mg (has no administration in time range)  ondansetron (ZOFRAN) injection 4 mg (has no administration in time range)  furosemide (LASIX) injection 40 mg (has no administration in time range)  metoprolol succinate (TOPROL-XL) 24 hr tablet 50 mg (has no administration in time range)  sodium chloride flush (NS) 0.9 % injection 3 mL (3 mLs Intravenous Given 10/19/19 2052)  iohexol (OMNIPAQUE)  350 MG/ML injection 100 mL (58 mLs Intravenous Contrast Given 10/19/19 2102)    ED Course  I have reviewed the triage vital signs and the nursing notes.  Pertinent labs & imaging results that were available during my care of the patient were reviewed by me and considered in my medical decision making (see chart for details).    MDM Rules/Calculators/A&P                          Yaiza Palazzola is a 86 y.o. female with a past medical history significant for permanent atrial fibrillation on Coumadin therapy, CHF on torsemide, prior appendicitis with abscess, CKD, thyroid disease, and hypertension who presents with chest pain, shortness of breath, nausea, and fatigue.  According to patient, patient woke up this morning at around 3 AM with severe pain in her chest with shortness of breath.  She reports that she has been very winded when she tries to walk around.  She reports the pain is very pleuritic with deep breathing.  She is breathing fast and was found to be hypoxic on arrival to the emergency department with oxygen saturations in the 80s and she was placed on 2 L nasal cannula.  She does report her legs have been more swollen recently and she has been taking the medications as directed.  She denies fevers, chills, congestion, or cough.  She denies any abdominal pain constipation, or diarrhea.  Family reports that what she would normally be able to ambulate to do she was getting more short of breath over the last few days not just today.  On exam, patient has edematous legs.  Rales are present.  No significant rhonchi.  Chest and abdomen are nontender.  Good bowel sounds.  Good pulses in extremities.  Patient is now resting more comfortably on oxygen.  EKG did not show STEMI.  Similar A. fib to prior.  Family reports that there is a strong family history of pulmonary embolism, despite the patient being on a blood thinner, they are concerned about this.  Patient had a PE study to rule out  thromboembolic cause of her chest pain and shortness of breath and hypoxia.  PE study was negative but there was evidence of pleural effusion, pericardial effusion, and some edema.  Patient's legs are still edematous.  Patient had laboratory testing which confirmed elevated BNP.  Covid test is negative.  Patient does have a leukocytosis and troponin is initially not elevated.  Lipase not elevated.  Liver function on elevated.  Due to the patient's new oxygen requirement she does not use oxygen normally, fluid overload on imaging exam and labs, and her shortness of breath and chest discomfort, will admit for further management.  She will be admitted to medicine service for CHF exacerbation with fluid on her lungs with pleural effusion and some pericardial effusion.  Will defer to admitting team as far as if she needs a official echocardiogram.   Final Clinical Impression(s) / ED Diagnoses Final diagnoses:  Hypoxia  Edema, unspecified type     Clinical Impression: 1. Hypoxia   2. Edema, unspecified type     Disposition: Admit  This note was prepared with assistance of Dragon voice recognition software. Occasional wrong-word or sound-a-like substitutions may have occurred due to the inherent limitations of voice recognition software.     Hassell Patras, Gwenyth Allegra, MD 10/20/19 [phone]

## 2019-10-19 NOTE — H&P (Signed)
History and Physical    Ashley Dunn KVQ:[id number] DOB: 1925-11-08 DOA: 10/19/2019  PCP: [name], MD  Patient coming from: Home  I have personally briefly reviewed patient's old medical records in [place]  Chief Complaint: SOB  HPI: Ashley Dunn is a 86 y.o. female with medical history significant of A.Fib on coumadin, HFrEF with EF 46% as of March, appendicitis s/p appendectomy in April pathology showing carcinoma, pt indicates she just wants observation for the moment given her advanced age.  Pt presents to ED with c/o SOB, CP, nausea, fatigue.  Onset at 3am this morning.  She has been having DOE for a while as well as BLE edema that has been worsening.   ED Course: O2 sats in the 80s initially, put on 2L via [place].  Has been taking torsemide at home.  BNP 475.4, trop neg x2.  CTA chest: neg for PE, does show trace pleural and pericardial effusions.   Review of Systems: As per HPI, otherwise all review of systems negative.  Past Medical History:  Diagnosis Date  . Arthritis   . Atrial fibrillation ([place])   . Cataract   . CHF (congestive heart failure) ([place])   . Hypertension   . Osteoporosis   . Thyroid disease     Past Surgical History:  Procedure Laterality Date  . CESAREAN SECTION    . EYE SURGERY    . FRACTURE SURGERY    . IR CATHETER TUBE CHANGE  06/12/2019  . IR CATHETER TUBE CHANGE  08/05/2019  . IR RADIOLOGIST EVAL & MGMT  06/11/2019  . IR RADIOLOGIST EVAL & MGMT  06/25/2019  . IR RADIOLOGIST EVAL & MGMT  07/30/2019  . LAPAROSCOPIC APPENDECTOMY N/A 08/07/2019   Procedure: LAPAROSCOPIC  APPENDECTOMY;  Surgeon: Ralene Ok, MD;  Location: [facility];  Service: General;  Laterality: N/A;  . LAPAROSCOPY N/A 08/07/2019   Procedure: Laparoscopy Diagnostic and Lysis of Adhesions;  Surgeon: Ralene Ok, MD;  Location: [facility];  Service: General;  Laterality: N/A;     reports that she has never smoked. She has never used smokeless tobacco. She reports that  she does not drink alcohol and does not use drugs.  No Known Allergies  Family History  Problem Relation Age of Onset  . Stroke Mother   . Cancer Father   . Hyperlipidemia Brother   . Healthy Daughter   . Healthy Son   . Heart disease Maternal Grandmother   . Heart disease Maternal Grandfather      Prior to Admission medications   Medication Sig Start Date End Date Taking? Authorizing Provider  acetaminophen (TYLENOL) 500 MG tablet Take 2 tablets (1,000 mg total) by mouth every 6 (six) hours as needed for mild pain or fever. 08/09/19  Yes Saverio Danker, PA-C  Ascorbic Acid (VITAMIN C) 100 MG tablet Take 100 mg by mouth daily.   Yes [provider]  Calcium 150 MG TABS Take 150 mg by mouth daily.   Yes [provider]  Cholecalciferol (VITAMIN D3) 10 MCG (400 UNIT) CAPS Take 400 Units by mouth daily.   Yes [provider]  diltiazem (CARDIZEM CD) 240 MG 24 hr capsule Take 240 mg by mouth 2 (two) times daily.  12/28/18  Yes [provider]  Garlic 643 MG TABS Take 100 mg by mouth daily.   Yes [provider]  lactobacillus acidophilus (BACID) TABS tablet Take 1 tablet by mouth daily with breakfast. 1 hr before breakfast and 2 hours after  meds   Yes [provider]  LINOLEIC ACID-SUNFLOWER OIL PO Take 1 capsule by mouth daily.   Yes [provider]  metoprolol succinate (TOPROL-XL) 50 MG 24 hr tablet Take 1 tablet (50 mg total) by mouth in the morning and at bedtime. 07/25/19  Yes Patwardhan, Reynold Bowen, MD  Omega-3 Fatty Acids (FISH OIL) 1000 MG CAPS Take 1,000 mg by mouth daily.   Yes [provider]  thiamine (VITAMIN B-1) 100 MG tablet Take 100 mg by mouth daily.   Yes [provider]  thyroid (ARMOUR THYROID) 120 MG tablet Take 1 tablet (120 mg total) by mouth daily before breakfast. 08/02/19  Yes Stallings, Zoe A, MD  torsemide (DEMADEX) 20 MG tablet Take 1 tablet (20 mg total) by mouth 2 (two) times daily.  10/02/19  Yes Patwardhan, Manish J, MD  Turmeric (QC TUMERIC COMPLEX) 500 MG CAPS Take 500 mg by mouth daily.   Yes [provider]  vitamin E 1000 UNIT capsule Take 1,000 Units by mouth daily.   Yes [provider]  warfarin (COUMADIN) 5 MG tablet Take 2.5-5 mg by mouth See admin instructions. Take 5 mg by mouth on Mon, Wed, Fri, Sat & Sun after supper. Take 2.5 MG Tuesday, Thursday 12/28/18  Yes [provider]    Physical Exam: Vitals:   10/19/19 1858 10/19/19 2030 10/19/19 2300 10/19/19 2330  BP: 129/66 135/78 (!) 105/52 (!) 105/53  Pulse: 80 93 67 (!) 53  Resp: 14 (!) 26 16 16   Temp: 98.6 F (37 C)     TempSrc: Oral     SpO2: 94% 96% 96% 100%    Constitutional: NAD, calm, comfortable Eyes: PERRL, lids and conjunctivae normal ENMT: Mucous membranes are moist. Posterior pharynx clear of any exudate or lesions.Normal dentition.  Neck: normal, supple, no masses, no thyromegaly Respiratory: clear to auscultation bilaterally, no wheezing, no crackles. Normal respiratory effort. No accessory muscle use.  Cardiovascular: irr, irr, BLE edema present Abdomen: no tenderness, no masses palpated. No hepatosplenomegaly. Bowel sounds positive.  Musculoskeletal: no clubbing / cyanosis. No joint deformity upper and lower extremities. Good ROM, no contractures. Normal muscle tone.  Skin: no rashes, lesions, ulcers. No induration Neurologic: CN 2-12 grossly intact. Sensation intact, DTR normal. Strength 5/5 in all 4.  Psychiatric: Normal judgment and insight. Alert and oriented x 3. Normal mood.    Labs on Admission: I have personally reviewed following labs and imaging studies  CBC: Recent Labs  Lab 10/19/19 1517  WBC 12.9*  HGB 13.5  HCT 41.9  MCV 99.1  PLT 678   Basic Metabolic Panel: Recent Labs  Lab 10/19/19 1517  NA 138  K 3.6  CL 97*  CO2 30  GLUCOSE 159*  BUN 39*  CREATININE 1.35*  CALCIUM 9.0   GFR: CrCl cannot be calculated (Unknown ideal  weight.). Liver Function Tests: Recent Labs  Lab 10/19/19 2045  AST 35  ALT 33  ALKPHOS 74  BILITOT 1.1  PROT 6.9  ALBUMIN 3.0*   Recent Labs  Lab 10/19/19 2045  LIPASE 25   No results for input(s): AMMONIA in the last 168 hours. Coagulation Profile: Recent Labs  Lab 10/15/19 0908 10/19/19 2045  INR 1.8* 1.6*   Cardiac Enzymes: No results for input(s): CKTOTAL, CKMB, CKMBINDEX, TROPONINI in the last 168 hours. BNP (last 3 results) No results for input(s): PROBNP in the last 8760 hours. HbA1C: No results for input(s): HGBA1C in the last 72 hours. CBG: No results for input(s):  GLUCAP in the last 168 hours. Lipid Profile: No results for input(s): CHOL, HDL, LDLCALC, TRIG, CHOLHDL, LDLDIRECT in the last 72 hours. Thyroid Function Tests: No results for input(s): TSH, T4TOTAL, FREET4, T3FREE, THYROIDAB in the last 72 hours. Anemia Panel: No results for input(s): VITAMINB12, FOLATE, FERRITIN, TIBC, IRON, RETICCTPCT in the last 72 hours. Urine analysis:    Component Value Date/Time   COLORURINE YELLOW 08/05/2019 [place] 08/05/2019 0755   LABSPEC 1.015 08/05/2019 0755   PHURINE 6.0 08/05/2019 0755   GLUCOSEU NEGATIVE 08/05/2019 0755   HGBUR NEGATIVE 08/05/2019 0755   BILIRUBINUR NEGATIVE 08/05/2019 0755   KETONESUR NEGATIVE 08/05/2019 0755   PROTEINUR NEGATIVE 08/05/2019 0755   NITRITE NEGATIVE 08/05/2019 0755   LEUKOCYTESUR NEGATIVE 08/05/2019 0755    Radiological Exams on Admission: DG Chest 2 View  Result Date: 10/19/2019 CLINICAL DATA:  Chest pain EXAM: CHEST - 2 VIEW COMPARISON:  05/29/2019 FINDINGS: Cardiomegaly. Aortic atherosclerosis. Mild vascular congestion. Bibasilar atelectasis or scarring. No effusions or overt edema. No acute bony abnormality. IMPRESSION: Cardiomegaly, vascular congestion. Bibasilar scarring or atelectasis. Electronically Signed   By: Rolm Baptise M.D.   On: 10/19/2019 15:39   CT Angio Chest PE W and/or Wo  Contrast  Result Date: 10/19/2019 CLINICAL DATA:  Pain. Generalized weakness. EXAM: CT ANGIOGRAPHY CHEST WITH CONTRAST TECHNIQUE: Multidetector CT imaging of the chest was performed using the standard protocol during bolus administration of intravenous contrast. Multiplanar CT image reconstructions and MIPs were obtained to evaluate the vascular anatomy. CONTRAST:  101mL OMNIPAQUE IOHEXOL 350 MG/ML SOLN COMPARISON:  None. FINDINGS: Cardiovascular: Contrast injection is sufficient to demonstrate satisfactory opacification of the pulmonary arteries to the segmental level. There is no pulmonary embolus or evidence of right heart strain. The heart is enlarged. There is a trace pericardial effusion. Atherosclerotic changes are noted of the thoracic aorta without definite evidence for an aneurysm. There are coronary artery calcifications. Mediastinum/Nodes: --there are few mildly enlarged mediastinal lymph nodes, presumably reactive. -- No hilar lymphadenopathy. -- No axillary lymphadenopathy. -- No supraclavicular lymphadenopathy. -- Normal thyroid gland where visualized. -  Unremarkable esophagus. Lungs/Pleura: There is a trace left-sided pleural effusion. There is atelectasis at the lung bases. There is no pneumothorax. The trachea is unremarkable. Upper Abdomen: Contrast bolus timing is not optimized for evaluation of the abdominal organs. The visualized portions of the organs of the upper abdomen are normal. Musculoskeletal: There is no acute displaced fracture. There is a subcutaneous soft tissue nodule involving the upper left anterior chest wall measuring approximately 1.8 cm (axial series 5, 15). This may represent a small sebaceous cyst. Review of the MIP images confirms the above findings. IMPRESSION: 1. No evidence for pulmonary embolus. 2. Trace left-sided pleural effusion with atelectasis at the lung bases. 3. Cardiomegaly with a trace pericardial effusion. Aortic Atherosclerosis (ICD10-I70.0).  Electronically Signed   By: Constance Holster M.D.   On: 10/19/2019 21:16    EKG: Independently reviewed.  Assessment/Plan Principal Problem:   Acute on chronic systolic CHF (congestive heart failure) (HCC) Active Problems:   Permanent atrial fibrillation (HCC)   Hypertension   Chronic kidney disease (CKD), stage III (moderate)   Cancer of appendix ([place])    1. Acute on chronic systolic CHF - 1. Lasix 40mg  IV daily, first dose in ED 2. CHF pathway 3. Tele monitor 4. Just had 2d echo in March: EF 46%, mild PAH 5. Strict intake and output 6. Daily BMP 2. A.Fib - 1. Cont coumadin 2. Cont cardizem, holding  tonights dose and dosing in AM due to soft BPs and giving lasix 3. Cont metoprolol 3. HTN - 1. Cont cardizem 2. Cont metoprolol 4. CKD stage 3 - 1. Stable 2. Monitor with diuresis 5. Cancer of appendix - 1. S/p appendectomy in April 2. Discussed with pt and family at bedside 3. Right now pt doesn't want to pursue further given her age. 1. I Agreed that this was more than reasonable (since any further treatment at this point would involve cytotoxic chemotherapy in this 86 yo who is otherwise asymptomatic from a CA standpoint and we dont know of any metastasis at this point anyhow).  DVT prophylaxis: Coumadin Code Status: DNR Family Communication: Family at bedside Disposition Plan: Home after admit Consults called: None Admission status: Place in 34    Alexei Ey, [place] Hospitalists  How to contact the [facility] Attending or Consulting provider [place] or covering provider during after hours [place], for this patient?  1. Check the care team in [facility] and look for a) attending/consulting [facility] provider listed and b) the [facility] team listed 2. Log into [email or web address]  Amion Physician Scheduling and messaging for groups and whole hospitals  On call and physician scheduling software for group practices, residents, hospitalists and other medical providers for call, clinic,  rotation and shift schedules. OnCall Enterprise is a hospital-wide system for scheduling doctors and paging doctors on call. EasyPlot is for scientific plotting and data analysis.  [email or web address]  and use [place] universal password to access. If you do not have the password, please contact the hospital operator.  3. Locate the [facility] provider you are looking for under [facility] and page to a number that you can be directly reached. 4. If you still have difficulty reaching the provider, please page the [facility] (Director on Call) for the Hospitalists listed on amion for assistance.  10/19/2019, 11:52 PM

## 2019-10-19 NOTE — ED Notes (Signed)
Pt family, Judson Roch, updated.

## 2019-10-19 NOTE — ED Notes (Signed)
[phone] Ashley Dunn granddaugher wants to know what going on . Please call

## 2019-10-19 NOTE — ED Triage Notes (Signed)
Pt states she woke up at 3am with indigestion.  Ate papaya and drank ginger ale and started belching.  States she continues to have pain.  Grandson reports generalized weakness x 1 week.  Denies SOB, nausea, and vomiting.

## 2019-10-19 NOTE — ED Notes (Signed)
Hilda Blades, daughter, [phone] would like an update when available

## 2019-10-19 NOTE — ED Notes (Signed)
Patient transported to CT 

## 2019-10-20 DIAGNOSIS — M810 Age-related osteoporosis without current pathological fracture: Secondary | ICD-10-CM | POA: Diagnosis present

## 2019-10-20 DIAGNOSIS — I130 Hypertensive heart and chronic kidney disease with heart failure and stage 1 through stage 4 chronic kidney disease, or unspecified chronic kidney disease: Secondary | ICD-10-CM | POA: Diagnosis present

## 2019-10-20 DIAGNOSIS — I081 Rheumatic disorders of both mitral and tricuspid valves: Secondary | ICD-10-CM | POA: Diagnosis present

## 2019-10-20 DIAGNOSIS — Z79899 Other long term (current) drug therapy: Secondary | ICD-10-CM | POA: Diagnosis not present

## 2019-10-20 DIAGNOSIS — I5023 Acute on chronic systolic (congestive) heart failure: Secondary | ICD-10-CM | POA: Diagnosis not present

## 2019-10-20 DIAGNOSIS — I5043 Acute on chronic combined systolic (congestive) and diastolic (congestive) heart failure: Secondary | ICD-10-CM | POA: Diagnosis present

## 2019-10-20 DIAGNOSIS — R0902 Hypoxemia: Secondary | ICD-10-CM | POA: Diagnosis present

## 2019-10-20 DIAGNOSIS — C181 Malignant neoplasm of appendix: Secondary | ICD-10-CM | POA: Diagnosis present

## 2019-10-20 DIAGNOSIS — Z20822 Contact with and (suspected) exposure to covid-19: Secondary | ICD-10-CM | POA: Diagnosis present

## 2019-10-20 DIAGNOSIS — Z8249 Family history of ischemic heart disease and other diseases of the circulatory system: Secondary | ICD-10-CM | POA: Diagnosis not present

## 2019-10-20 DIAGNOSIS — Z9049 Acquired absence of other specified parts of digestive tract: Secondary | ICD-10-CM | POA: Diagnosis not present

## 2019-10-20 DIAGNOSIS — I4821 Permanent atrial fibrillation: Secondary | ICD-10-CM | POA: Diagnosis present

## 2019-10-20 DIAGNOSIS — N183 Chronic kidney disease, stage 3 unspecified: Secondary | ICD-10-CM | POA: Diagnosis not present

## 2019-10-20 DIAGNOSIS — E0590 Thyrotoxicosis, unspecified without thyrotoxic crisis or storm: Secondary | ICD-10-CM | POA: Diagnosis present

## 2019-10-20 DIAGNOSIS — Z7901 Long term (current) use of anticoagulants: Secondary | ICD-10-CM | POA: Diagnosis not present

## 2019-10-20 DIAGNOSIS — Z66 Do not resuscitate: Secondary | ICD-10-CM | POA: Diagnosis present

## 2019-10-20 DIAGNOSIS — I4892 Unspecified atrial flutter: Secondary | ICD-10-CM | POA: Diagnosis present

## 2019-10-20 DIAGNOSIS — N1831 Chronic kidney disease, stage 3a: Secondary | ICD-10-CM | POA: Diagnosis present

## 2019-10-20 DIAGNOSIS — I272 Pulmonary hypertension, unspecified: Secondary | ICD-10-CM | POA: Diagnosis present

## 2019-10-20 DIAGNOSIS — Z83438 Family history of other disorder of lipoprotein metabolism and other lipidemia: Secondary | ICD-10-CM | POA: Diagnosis not present

## 2019-10-20 DIAGNOSIS — Z823 Family history of stroke: Secondary | ICD-10-CM | POA: Diagnosis not present

## 2019-10-20 LAB — T4, FREE: Free T4: 0.71 ng/dL (ref 0.61–1.12)

## 2019-10-20 LAB — BASIC METABOLIC PANEL
Anion gap: 10 (ref 5–15)
BUN: 31 mg/dL — ABNORMAL HIGH (ref 8–23)
CO2: 32 mmol/L (ref 22–32)
Calcium: 8.8 mg/dL — ABNORMAL LOW (ref 8.9–10.3)
Chloride: 97 mmol/L — ABNORMAL LOW (ref 98–111)
Creatinine, Ser: 1.29 mg/dL — ABNORMAL HIGH (ref 0.44–1.00)
GFR calc Af Amer: 42 mL/min — ABNORMAL LOW (ref >60)
GFR calc non Af Amer: 36 mL/min — ABNORMAL LOW (ref >60)
Glucose, Bld: 109 mg/dL — ABNORMAL HIGH (ref 70–99)
Potassium: 3.6 mmol/L (ref 3.5–5.1)
Sodium: 139 mmol/L (ref 135–145)

## 2019-10-20 LAB — PROTIME-INR
INR: 1.6 — ABNORMAL HIGH (ref 0.8–1.2)
Prothrombin Time: 18.8 seconds — ABNORMAL HIGH (ref 11.4–15.2)

## 2019-10-20 LAB — TSH: TSH: 1.451 u[IU]/mL (ref 0.350–4.500)

## 2019-10-20 MED ORDER — POTASSIUM CHLORIDE CRYS ER 20 MEQ PO TBCR
40.0000 meq | EXTENDED_RELEASE_TABLET | Freq: Once | ORAL | Status: AC
  Administered 2019-10-20: 40 meq via ORAL
  Filled 2019-10-20: qty 2

## 2019-10-20 MED ORDER — METOPROLOL SUCCINATE ER 50 MG PO TB24
50.0000 mg | ORAL_TABLET | Freq: Two times a day (BID) | ORAL | Status: DC
  Administered 2019-10-20 – 2019-10-23 (×7): 50 mg via ORAL
  Filled 2019-10-20 (×7): qty 1

## 2019-10-20 NOTE — Progress Notes (Signed)
PROGRESS NOTE    Ashley Dunn  LFY:[id number] DOB: [birth year] DOA: 10/19/2019 PCP: [name], MD    Chief Complaint  Patient presents with  . Chest Pain    Brief Narrative:  Ashley Dunn is a 86 y.o. female with medical history significant of A.Fib on coumadin, HFrEF with EF 46% as of March, appendicitis s/p appendectomy in April pathology showing carcinoma, pt indicates she just wants observation for the moment given her advanced age.  Pt presents to ED with c/o SOB, CP, nausea, fatigue. She has been having DOE for a while as well as BLE edema that has been worsening.  ED Course: O2 sats in the 80s initially, put on 2L via [place].  Has been taking torsemide at home.  BNP 475.4, trop neg x2.  CTA chest: neg for PE, does show trace pleural and pericardial effusions.   Assessment & Plan:   Principal Problem:   Acute on chronic systolic CHF (congestive heart failure) (HCC) Active Problems:   Permanent atrial fibrillation (HCC)   Hypertension   Chronic kidney disease (CKD), stage III (moderate)   Cancer of appendix (HCC)   Acute on chronic systolic (congestive) heart failure ([place])  1. Acute on chronic systolic CHF - 1. Lasix 40mg  IV daily, first dose in ED 2. CHF pathway 3. Continue to monitor on tele 4. Just had 2d echo in March: EF 46%, mild PAH 5. Strict intake and output 6. Daily BMP 2. A.Fib - 1. Cont coumadin 2. Cont cardizem 3. Cont metoprolol 3. HTN - 1. Cont cardizem 2. Cont metoprolol 4. CKD stage 3 - 1. Stable 2. Monitor with diuresis 5. Cancer of appendix - 1. S/p appendectomy in April 2. Right now pt doesn't want to pursue further given her age.   DVT prophylaxis: Coumadin Code Status: DNR Family Communication: No family at bedside Disposition:   Status is: Inpatient  Remains inpatient appropriate because:Inpatient level of care appropriate due to severity of illness   Dispo: The patient is from: Home              Anticipated d/c is  to: Home              Anticipated d/c date is: 2 days              Patient currently is not medically stable to d/c.       Consultants:   None  Procedures:  None   Antimicrobials:   None    Subjective: She is still having some shortness of breath.  On oxygen by nasal cannula.  Objective: Vitals:   10/20/19 0500 10/20/19 0805 10/20/19 0910 10/20/19 1215  BP:  118/63 (!) 109/54 (!) 114/50  Pulse:  80 82 81  Resp:  16  15  Temp:  97.9 F (36.6 C)  97.8 F (36.6 C)  TempSrc:  Oral  Oral  SpO2:  96%  94%  Weight: 72.9 kg       Intake/Output Summary (Last 24 hours) at 10/20/2019 1459 Last data filed at 10/20/2019 1000 Gross per 24 hour  Intake 220 ml  Output 250 ml  Net -30 ml   Filed Weights   10/20/19 0011 10/20/19 0500  Weight: 72.9 kg 72.9 kg    Examination:  General exam: Appears calm and comfortable  Respiratory system: Clear to auscultation. Respiratory effort normal. Cardiovascular system: Irregularly irregular, lower extremity edema. Gastrointestinal system: Abdomen is nondistended, soft and nontender. No organomegaly or masses felt. Normal bowel sounds heard. Central  nervous system: Alert and oriented. No focal neurological deficits. Extremities: Symmetric 5 x 5 power. Skin: No rashes, lesions or ulcers Psychiatry: Judgement and insight appear normal. Mood & affect appropriate.     Data Reviewed: I have personally reviewed following labs and imaging studies  CBC: Recent Labs  Lab 10/19/19 1517  WBC 12.9*  HGB 13.5  HCT 41.9  MCV 99.1  PLT 128    Basic Metabolic Panel: Recent Labs  Lab 10/19/19 1517 10/20/19 0213  NA 138 139  K 3.6 3.6  CL 97* 97*  CO2 30 32  GLUCOSE 159* 109*  BUN 39* 31*  CREATININE 1.35* 1.29*  CALCIUM 9.0 8.8*    GFR: Estimated Creatinine Clearance: 25.3 mL/min (A) (by C-G formula based on SCr of 1.29 mg/dL (H)).  Liver Function Tests: Recent Labs  Lab 10/19/19 2045  AST 35  ALT 33  ALKPHOS 74    BILITOT 1.1  PROT 6.9  ALBUMIN 3.0*    CBG: No results for input(s): GLUCAP in the last 168 hours.   Recent Results (from the past 240 hour(s))  SARS Coronavirus 2 by RT PCR (hospital order, performed in [facility] hospital lab) Nasopharyngeal Nasopharyngeal Swab     Status: None   Collection Time: 10/19/19  9:07 PM   Specimen: Nasopharyngeal Swab  Result Value Ref Range Status   SARS Coronavirus 2 NEGATIVE NEGATIVE Final    Comment: (NOTE) SARS-CoV-2 target nucleic acids are NOT DETECTED.  The SARS-CoV-2 RNA is generally detectable in upper and lower respiratory specimens during the acute phase of infection. The lowest concentration of SARS-CoV-2 viral copies this assay can detect is 250 copies / mL. A negative result does not preclude SARS-CoV-2 infection and should not be used as the sole basis for treatment or other patient management decisions.  A negative result may occur with improper specimen collection / handling, submission of specimen other than nasopharyngeal swab, presence of viral mutation(s) within the areas targeted by this assay, and inadequate number of viral copies (<250 copies / mL). A negative result must be combined with clinical observations, patient history, and epidemiological information.  Fact Sheet for Patients:   StrictlyIdeas.no  Fact Sheet for Healthcare Providers: BankingDealers.co.za  This test is not yet approved or  cleared by the [place] FDA and has been authorized for detection and/or diagnosis of SARS-CoV-2 by FDA under an Emergency Use Authorization (EUA).  This EUA will remain in effect (meaning this test can be used) for the duration of the COVID-19 declaration under Section 564(b)(1) of the Act, 21 U.S.C. section 360bbb-3(b)(1), unless the authorization is terminated or revoked sooner.  Performed at [facility], [place] [address] [place], [place] [address]           Radiology Studies: DG Chest 2 View  Result Date: 10/19/2019 CLINICAL DATA:  Chest pain EXAM: CHEST - 2 VIEW COMPARISON:  05/29/2019 FINDINGS: Cardiomegaly. Aortic atherosclerosis. Mild vascular congestion. Bibasilar atelectasis or scarring. No effusions or overt edema. No acute bony abnormality. IMPRESSION: Cardiomegaly, vascular congestion. Bibasilar scarring or atelectasis. Electronically Signed   By: Rolm Baptise M.D.   On: 10/19/2019 15:39   CT Angio Chest PE W and/or Wo Contrast  Result Date: 10/19/2019 CLINICAL DATA:  Pain. Generalized weakness. EXAM: CT ANGIOGRAPHY CHEST WITH CONTRAST TECHNIQUE: Multidetector CT imaging of the chest was performed using the standard protocol during bolus administration of intravenous contrast. Multiplanar CT image reconstructions and MIPs were obtained to evaluate the vascular anatomy. CONTRAST:  2mL  OMNIPAQUE IOHEXOL 350 MG/ML SOLN COMPARISON:  None. FINDINGS: Cardiovascular: Contrast injection is sufficient to demonstrate satisfactory opacification of the pulmonary arteries to the segmental level. There is no pulmonary embolus or evidence of right heart strain. The heart is enlarged. There is a trace pericardial effusion. Atherosclerotic changes are noted of the thoracic aorta without definite evidence for an aneurysm. There are coronary artery calcifications. Mediastinum/Nodes: --there are few mildly enlarged mediastinal lymph nodes, presumably reactive. -- No hilar lymphadenopathy. -- No axillary lymphadenopathy. -- No supraclavicular lymphadenopathy. -- Normal thyroid gland where visualized. -  Unremarkable esophagus. Lungs/Pleura: There is a trace left-sided pleural effusion. There is atelectasis at the lung bases. There is no pneumothorax. The trachea is unremarkable. Upper Abdomen: Contrast bolus timing is not optimized for evaluation of the abdominal organs. The visualized portions of the organs of the upper abdomen are normal. Musculoskeletal:  There is no acute displaced fracture. There is a subcutaneous soft tissue nodule involving the upper left anterior chest wall measuring approximately 1.8 cm (axial series 5, 15). This may represent a small sebaceous cyst. Review of the MIP images confirms the above findings. IMPRESSION: 1. No evidence for pulmonary embolus. 2. Trace left-sided pleural effusion with atelectasis at the lung bases. 3. Cardiomegaly with a trace pericardial effusion. Aortic Atherosclerosis (ICD10-I70.0). Electronically Signed   By: Constance Holster M.D.   On: 10/19/2019 21:16    Scheduled Meds: . diltiazem  240 mg Oral BID  . furosemide  40 mg Intravenous Daily  . metoprolol succinate  50 mg Oral BID  . sodium chloride flush  3 mL Intravenous Q12H  . thiamine  100 mg Oral Daily  . thyroid  120 mg Oral QAC breakfast  . [START ON 10/23/2019] warfarin  2.5 mg Oral Once per day on Wed Fri  . warfarin  5 mg Oral Once per day on Sun Mon Tue Thu Sat  . Warfarin - Pharmacist Dosing Inpatient   Does not apply q1600   Continuous Infusions: . sodium chloride       LOS: 0 days    Yaakov Guthrie, MD [facility] Pager on amion  To contact the attending provider between 7A-7P or the covering provider during after hours 7P-7A, please log into the web site [email or web address] and access using universal [place] password for that web site. If you do not have the password, please call the hospital operator.  10/20/2019, 2:59 PM

## 2019-10-20 NOTE — Progress Notes (Signed)
ANTICOAGULATION CONSULT NOTE - Follow-up Consult  Pharmacy Consult for Coumadin Indication: atrial fibrillation  No Known Allergies  Patient Measurements:   Vital Signs: Temp: 97.9 F (36.6 C) (06/27 0805) Temp Source: Oral (06/27 0805) BP: 118/63 (06/27 0805) Pulse Rate: 80 (06/27 0805)  Labs: Recent Labs    10/19/19 1517 10/19/19 1747 10/19/19 2045 10/20/19 0213  HGB 13.5  --   --   --   HCT 41.9  --   --   --   PLT 190  --   --   --   LABPROT  --   --  18.6* 18.8*  INR  --   --  1.6* 1.6*  CREATININE 1.35*  --   --  1.29*  TROPONINIHS 12 11  --   --     Medical History: Past Medical History:  Diagnosis Date  . Arthritis   . Atrial fibrillation ([place])   . Cataract   . CHF (congestive heart failure) ([place])   . Hypertension   . Osteoporosis   . Thyroid disease     Assessment: 86yo female c/o indigestion, admitted for CHF exacerbation, to continue Coumadin for Afib; pt was seen in outpt [facility] clinic on 6/22 and had low INR and had dose adjustment; INR remains below goal w/ last dose taken 6/25.  Goal of Therapy:  INR 2-3   Plan:  Will continue new home regimen (5mg  daily except 2.5mg  on Wed/Fri) for now and give boosted dose if INR does not start trending up.  Continue plan from last night (see above).  Will allow more time for warfarin to work.  Follow-up with INR in AM.  Horton Chin, Pharm.D., BCPS Clinical Pharmacist  **Pharmacist phone directory can be found on [email or web address] listed under [place].  10/20/2019 8:51 AM

## 2019-10-21 DIAGNOSIS — N183 Chronic kidney disease, stage 3 unspecified: Secondary | ICD-10-CM

## 2019-10-21 LAB — CBC
HCT: 38.6 % (ref 36.0–46.0)
Hemoglobin: 12.5 g/dL (ref 12.0–15.0)
MCH: 32.5 pg (ref 26.0–34.0)
MCHC: 32.4 g/dL (ref 30.0–36.0)
MCV: 100.3 fL — ABNORMAL HIGH (ref 80.0–100.0)
Platelets: 138 10*3/uL — ABNORMAL LOW (ref 150–400)
RBC: 3.85 MIL/uL — ABNORMAL LOW (ref 3.87–5.11)
RDW: 14.5 % (ref 11.5–15.5)
WBC: 9.2 10*3/uL (ref 4.0–10.5)
nRBC: 0 % (ref 0.0–0.2)

## 2019-10-21 LAB — BASIC METABOLIC PANEL
Anion gap: 10 (ref 5–15)
BUN: 37 mg/dL — ABNORMAL HIGH (ref 8–23)
CO2: 30 mmol/L (ref 22–32)
Calcium: 8.7 mg/dL — ABNORMAL LOW (ref 8.9–10.3)
Chloride: 99 mmol/L (ref 98–111)
Creatinine, Ser: 1.2 mg/dL — ABNORMAL HIGH (ref 0.44–1.00)
GFR calc Af Amer: 46 mL/min — ABNORMAL LOW (ref >60)
GFR calc non Af Amer: 39 mL/min — ABNORMAL LOW (ref >60)
Glucose, Bld: 92 mg/dL (ref 70–99)
Potassium: 4.1 mmol/L (ref 3.5–5.1)
Sodium: 139 mmol/L (ref 135–145)

## 2019-10-21 LAB — PROTIME-INR
INR: 1.7 — ABNORMAL HIGH (ref 0.8–1.2)
Prothrombin Time: 19.1 seconds — ABNORMAL HIGH (ref 11.4–15.2)

## 2019-10-21 LAB — MAGNESIUM: Magnesium: 2.5 mg/dL — ABNORMAL HIGH (ref 1.7–2.4)

## 2019-10-21 MED ORDER — FUROSEMIDE 10 MG/ML IJ SOLN
40.0000 mg | Freq: Two times a day (BID) | INTRAMUSCULAR | Status: DC
  Administered 2019-10-21 – 2019-10-22 (×3): 40 mg via INTRAVENOUS
  Filled 2019-10-21 (×3): qty 4

## 2019-10-21 MED ORDER — FUROSEMIDE 10 MG/ML IJ SOLN: 40.0000 mg | Freq: Two times a day (BID) | INTRAMUSCULAR | Status: DC

## 2019-10-21 MED ORDER — PNEUMOCOCCAL VAC POLYVALENT 25 MCG/0.5ML IJ INJ: 0.5000 mL | INJECTION | INTRAMUSCULAR | Status: DC

## 2019-10-21 NOTE — Discharge Instructions (Signed)

## 2019-10-21 NOTE — Evaluation (Signed)
Occupational Therapy Evaluation Patient Details Name: Ashley Dunn MRN: [id number] DOB: 1925/08/19 Today's Date: 10/21/2019    History of Present Illness Pt presents to ED with Dunn/o SOB, CP, nausea, fatigue. She has been having DOE for a while as well as BLE edema. O2 requiring 2L O2 Ashley Dunn. PMHx: A.Fib on coumadin, HFrEF with EF 46% as of March, appendicitis s/p appendectomy in April pathology showing carcinoma, CHF, HTN.   Clinical Impression   Pt PTA: Pt performing own ADL and mobility. Pt has 24/7 care, but does not require physical assist. Pt currently limited by decreased activity tolerance. Pt able to perform ADL with no physical assist and supervisionA to modified independence. Pt does not require AD for mobility with supervisionA. O2 requring 2L O2 >90%; HR 46-57 BPM, BP 114/55. Pt does not require continued OT skilled services.  OT signing off.       Follow Up Recommendations  No OT follow up;Supervision - Intermittent    Equipment Recommendations  Tub/shower seat    Recommendations for Other Services       Precautions / Restrictions Precautions Precautions: Fall Restrictions Weight Bearing Restrictions: No      Mobility Bed Mobility Overal bed mobility: Needs Assistance Bed Mobility: Supine to Sit     Supine to sit: Supervision        Transfers Overall transfer level: Needs assistance   Transfers: Sit to/from Stand Sit to Stand: Supervision         General transfer comment: no physical assist required    Balance Overall balance assessment: No apparent balance deficits (not formally assessed)                                         ADL either performed or assessed with clinical judgement   ADL Overall ADL's : Modified independent                                       General ADL Comments: Pt limited by decreased activity tolerance. O2 requring 2L O2 >90%; HR 46-57 BPM, BP 114/55.     Vision Baseline  Vision/History: No visual deficits Patient Visual Report: No change from baseline Vision Assessment?: No apparent visual deficits     Perception     Praxis      Pertinent Vitals/Pain Pain Assessment: No/denies pain     Hand Dominance Right   Extremity/Trunk Assessment Upper Extremity Assessment Upper Extremity Assessment: Overall WFL for tasks assessed   Lower Extremity Assessment Lower Extremity Assessment: Overall WFL for tasks assessed   Cervical / Trunk Assessment Cervical / Trunk Assessment: Normal   Communication Communication Communication: HOH   Cognition Arousal/Alertness: Awake/alert Behavior During Therapy: WFL for tasks assessed/performed Overall Cognitive Status: Within Functional Limits for tasks assessed                                     General Comments  O2 requring 2L O2 >90%; HR 46-57 BPM, BP 114/55.    Exercises     Shoulder Instructions      Home Living Family/patient expects to be discharged to:: Private residence Living Arrangements: Children Available Help at Discharge: Family;Available 24 hours/day Type of Home: House Home Access: Stairs to enter [employer] of  Steps: 1   Home Layout: One level     Bathroom Shower/Tub: Occupational psychologist: Standard     Home Equipment: Environmental consultant - 4 wheels;Cane - single point;Bedside commode;Grab bars - toilet          Prior Functioning/Environment Level of Independence: Independent with assistive device(s)        Comments: ambulates with use of a rollator, independent with ADLs, limited iADLs         OT Problem List: Decreased activity tolerance      OT Treatment/Interventions:      OT Goals(Current goals can be found in the care plan section)    OT Frequency:     Barriers to D/Dunn:            Co-evaluation              AM-PAC OT "6 Clicks" Daily Activity     Outcome Measure Help from another person eating meals?: None Help from  another person taking care of personal grooming?: None Help from another person toileting, which includes using toliet, bedpan, or urinal?: None Help from another person bathing (including washing, rinsing, drying)?: A Little Help from another person to put on and taking off regular upper body clothing?: None Help from another person to put on and taking off regular lower body clothing?: None 6 Click Score: 23   End of Session Equipment Utilized During Treatment: Oxygen Nurse Communication: Mobility status  Activity Tolerance: Patient tolerated treatment well Patient left: in chair;with call bell/phone within reach  OT Visit Diagnosis: Unsteadiness on feet (R26.81)                Time: 1230-1300 OT Time Calculation (min): 30 min Charges:  OT General Charges $OT Visit: 1 Visit OT Evaluation $OT Eval Moderate Complexity: 1 Mod OT Treatments $Self Care/Home Management : 8-22 mins  Ashley Dunn, OTR/L Acute Rehabilitation Services Pager: [phone] Office: [phone]   Ashley Dunn 10/21/2019, 3:11 PM

## 2019-10-21 NOTE — Progress Notes (Signed)
ANTICOAGULATION CONSULT NOTE - Follow-up Consult  Pharmacy Consult for Coumadin Indication: atrial fibrillation  No Known Allergies  Patient Measurements:   Vital Signs: Temp: 98.2 F (36.8 C) (06/28 1147) Temp Source: Oral (06/28 1147) BP: 114/57 (06/28 1147) Pulse Rate: 63 (06/28 1147)  Labs: Recent Labs    10/19/19 1517 10/19/19 1747 10/19/19 2045 10/20/19 0213 10/21/19 0342  HGB 13.5  --   --   --  12.5  HCT 41.9  --   --   --  38.6  PLT 190  --   --   --  138*  LABPROT  --   --  18.6* 18.8* 19.1*  INR  --   --  1.6* 1.6* 1.7*  CREATININE 1.35*  --   --  1.29* 1.20*  TROPONINIHS 12 11  --   --   --     Medical History: Past Medical History:  Diagnosis Date  . Arthritis   . Atrial fibrillation ([place])   . Cataract   . CHF (congestive heart failure) ([place])   . Hypertension   . Osteoporosis   . Thyroid disease     Assessment: 86yo female c/o indigestion, admitted for CHF exacerbation, to continue Coumadin for Afib; pt was seen in outpt [facility] clinic on 6/22 and had low INR and had dose adjustment.   INR still low and slowly trending up this am to 1.7. No bleeding issues noted. CBC ok this morning.   Goal of Therapy:  INR 2-3   Plan:  Continue current regimen (5mg  daily except 2.5mg  on Wed/Fri) for now and consider increased dose in am if not responding.   Erin Hearing PharmD., BCPS Clinical Pharmacist 10/21/2019 2:49 PM  **Pharmacist phone directory can be found on [email or web address] listed under [place].

## 2019-10-21 NOTE — Progress Notes (Signed)
PROGRESS NOTE    Ashley Dunn  GMW:[id number] DOB: Apr 03, 1926 DOA: 10/19/2019 PCP: [name], MD    Chief Complaint  Patient presents with  . Chest Pain    Brief Narrative:  Ashley Dunn is a 86 y.o. female with medical history significant of A.Fib on coumadin, HFrEF with EF 46% as of March, appendicitis s/p appendectomy in April pathology showing carcinoma, pt indicates she just wants observation for the moment given her advanced age.  Pt presents to ED with c/o SOB, CP, nausea, fatigue. She has been having DOE for a while as well as BLE edema that has been worsening.  ED Course: O2 sats in the 80s initially, put on 2L via [place].  Has been taking torsemide at home.  BNP 475.4, trop neg x2.  CTA chest: neg for PE, does show trace pleural and pericardial effusions.   Assessment & Plan:   Principal Problem:   Acute on chronic systolic CHF (congestive heart failure) (HCC) Active Problems:   Permanent atrial fibrillation (HCC)   Hypertension   Chronic kidney disease (CKD), stage III (moderate)   Cancer of appendix (HCC)   Acute on chronic systolic (congestive) heart failure ([place])  1. Acute on chronic systolic CHF - 1. Lasix 40mg  IV daily.  She still requiring oxygen.  At baseline she is not on oxygen.  Continues to have bilateral lower extremity edema.  Will increase the Lasix to twice daily.  Monitor BUN/trending closely while on the lasix. 2. Continue to monitor on tele 3. Just had 2d echo in March: EF 46%, mild PAH 4. Strict intake and output 5. Daily BMP 2. A.Fib - 1. Cont coumadin.  Pharmacy managing the Coumadin. 2. Cont cardizem 3. Cont metoprolol 3. HTN - 1. Cont cardizem 2. Cont metoprolol 3. Continue to monitor blood pressure closely and adjust medications as needed. 4. CKD stage 3 - 1. Monitor with diuresis 5. Cancer of appendix - 1. S/p appendectomy in April 2. Right now pt doesn't want to pursue further given her age.   DVT prophylaxis:  Coumadin Code Status: DNR Family Communication: Discussed with patient daughter over the phone. Disposition:   Status is: Inpatient  Remains inpatient appropriate because:Inpatient level of care appropriate due to severity of illness   Dispo: The patient is from: Home              Anticipated d/c is to: Home              Anticipated d/c date is: 2 days              Patient currently is not medically stable to d/c.  Continues to require oxygen, still has lower extremity edema.  Needing IV diuresis.    Consultants:   None  Procedures:  None   Antimicrobials:   None    Subjective: She is still having shortness of breath.  On oxygen by nasal cannula. Has lower extremity edema.  Objective: Vitals:   10/21/19 0900 10/21/19 0905 10/21/19 1147 10/21/19 1629  BP:   (!) 114/57 128/67  Pulse:   63 (!) 52  Resp:   18 17  Temp:   98.2 F (36.8 C) 97.6 F (36.4 C)  TempSrc:   Oral Oral  SpO2: (!) 89% 93% 95% 96%  Weight:        Intake/Output Summary (Last 24 hours) at 10/21/2019 1717 Last data filed at 10/21/2019 1351 Gross per 24 hour  Intake 248 ml  Output 900 ml  Net -652  ml   Filed Weights   10/20/19 0011 10/20/19 0500 10/21/19 0451  Weight: 72.9 kg 72.9 kg 71.8 kg    Examination:  General exam: Appears calm and comfortable  Respiratory system: Clear to auscultation. Respiratory effort normal. Cardiovascular system: Irregularly irregular, lower extremity edema. Gastrointestinal system: Abdomen is nondistended, soft and nontender. No organomegaly or masses felt. Normal bowel sounds heard. Central nervous system: Alert and oriented. No focal neurological deficits. Extremities: Symmetric 5 x 5 power. Skin: No rashes, lesions or ulcers Psychiatry: Judgement and insight appear normal. Mood & affect appropriate.     Data Reviewed: I have personally reviewed following labs and imaging studies  CBC: Recent Labs  Lab 10/19/19 1517 10/21/19 0342  WBC 12.9* 9.2    HGB 13.5 12.5  HCT 41.9 38.6  MCV 99.1 100.3*  PLT 190 138*    Basic Metabolic Panel: Recent Labs  Lab 10/19/19 1517 10/20/19 0213 10/21/19 0342  NA 138 139 139  K 3.6 3.6 4.1  CL 97* 97* 99  CO2 30 32 30  GLUCOSE 159* 109* 92  BUN 39* 31* 37*  CREATININE 1.35* 1.29* 1.20*  CALCIUM 9.0 8.8* 8.7*  MG  --   --  2.5*    GFR: Estimated Creatinine Clearance: 27 mL/min (A) (by C-G formula based on SCr of 1.2 mg/dL (H)).  Liver Function Tests: Recent Labs  Lab 10/19/19 2045  AST 35  ALT 33  ALKPHOS 74  BILITOT 1.1  PROT 6.9  ALBUMIN 3.0*    CBG: No results for input(s): GLUCAP in the last 168 hours.   Recent Results (from the past 240 hour(s))  SARS Coronavirus 2 by RT PCR (hospital order, performed in [facility] hospital lab) Nasopharyngeal Nasopharyngeal Swab     Status: None   Collection Time: 10/19/19  9:07 PM   Specimen: Nasopharyngeal Swab  Result Value Ref Range Status   SARS Coronavirus 2 NEGATIVE NEGATIVE Final    Comment: (NOTE) SARS-CoV-2 target nucleic acids are NOT DETECTED.  The SARS-CoV-2 RNA is generally detectable in upper and lower respiratory specimens during the acute phase of infection. The lowest concentration of SARS-CoV-2 viral copies this assay can detect is 250 copies / mL. A negative result does not preclude SARS-CoV-2 infection and should not be used as the sole basis for treatment or other patient management decisions.  A negative result may occur with improper specimen collection / handling, submission of specimen other than nasopharyngeal swab, presence of viral mutation(s) within the areas targeted by this assay, and inadequate number of viral copies (<250 copies / mL). A negative result must be combined with clinical observations, patient history, and epidemiological information.  Fact Sheet for Patients:   StrictlyIdeas.no  Fact Sheet for Healthcare  Providers: BankingDealers.co.za  This test is not yet approved or  cleared by the [place] FDA and has been authorized for detection and/or diagnosis of SARS-CoV-2 by FDA under an Emergency Use Authorization (EUA).  This EUA will remain in effect (meaning this test can be used) for the duration of the COVID-19 declaration under Section 564(b)(1) of the Act, 21 U.S.C. section 360bbb-3(b)(1), unless the authorization is terminated or revoked sooner.  Performed at [facility], [place] [address], [place], [place] [address]      Radiology Studies: CT Angio Chest PE W and/or Wo Contrast  Result Date: 10/19/2019 CLINICAL DATA:  Pain. Generalized weakness. EXAM: CT ANGIOGRAPHY CHEST WITH CONTRAST TECHNIQUE: Multidetector CT imaging of the chest was performed using the standard  protocol during bolus administration of intravenous contrast. Multiplanar CT image reconstructions and MIPs were obtained to evaluate the vascular anatomy. CONTRAST:  62mL OMNIPAQUE IOHEXOL 350 MG/ML SOLN COMPARISON:  None. FINDINGS: Cardiovascular: Contrast injection is sufficient to demonstrate satisfactory opacification of the pulmonary arteries to the segmental level. There is no pulmonary embolus or evidence of right heart strain. The heart is enlarged. There is a trace pericardial effusion. Atherosclerotic changes are noted of the thoracic aorta without definite evidence for an aneurysm. There are coronary artery calcifications. Mediastinum/Nodes: --there are few mildly enlarged mediastinal lymph nodes, presumably reactive. -- No hilar lymphadenopathy. -- No axillary lymphadenopathy. -- No supraclavicular lymphadenopathy. -- Normal thyroid gland where visualized. -  Unremarkable esophagus. Lungs/Pleura: There is a trace left-sided pleural effusion. There is atelectasis at the lung bases. There is no pneumothorax. The trachea is unremarkable. Upper Abdomen: Contrast bolus timing is not optimized  for evaluation of the abdominal organs. The visualized portions of the organs of the upper abdomen are normal. Musculoskeletal: There is no acute displaced fracture. There is a subcutaneous soft tissue nodule involving the upper left anterior chest wall measuring approximately 1.8 cm (axial series 5, 15). This may represent a small sebaceous cyst. Review of the MIP images confirms the above findings. IMPRESSION: 1. No evidence for pulmonary embolus. 2. Trace left-sided pleural effusion with atelectasis at the lung bases. 3. Cardiomegaly with a trace pericardial effusion. Aortic Atherosclerosis (ICD10-I70.0). Electronically Signed   By: Constance Holster M.D.   On: 10/19/2019 21:16    Scheduled Meds: . diltiazem  240 mg Oral BID  . furosemide  40 mg Intravenous BID  . metoprolol succinate  50 mg Oral BID  . [START ON 10/22/2019] pneumococcal 23 valent vaccine  0.5 mL Intramuscular Tomorrow-1000  . sodium chloride flush  3 mL Intravenous Q12H  . thiamine  100 mg Oral Daily  . thyroid  120 mg Oral QAC breakfast  . [START ON 10/23/2019] warfarin  2.5 mg Oral Once per day on Wed Fri  . warfarin  5 mg Oral Once per day on Sun Mon Tue Thu Sat  . Warfarin - Pharmacist Dosing Inpatient   Does not apply q1600   Continuous Infusions: . sodium chloride       LOS: 1 day    Yaakov Guthrie, MD [facility] Pager on amion  To contact the attending provider between 7A-7P or the covering provider during after hours 7P-7A, please log into the web site [email or web address] and access using universal [place] password for that web site. If you do not have the password, please call the hospital operator.  10/21/2019, 5:17 PM

## 2019-10-22 LAB — BASIC METABOLIC PANEL
Anion gap: 9 (ref 5–15)
BUN: 47 mg/dL — ABNORMAL HIGH (ref 8–23)
CO2: 33 mmol/L — ABNORMAL HIGH (ref 22–32)
Calcium: 8.7 mg/dL — ABNORMAL LOW (ref 8.9–10.3)
Chloride: 100 mmol/L (ref 98–111)
Creatinine, Ser: 1.3 mg/dL — ABNORMAL HIGH (ref 0.44–1.00)
GFR calc Af Amer: 42 mL/min — ABNORMAL LOW (ref >60)
GFR calc non Af Amer: 36 mL/min — ABNORMAL LOW (ref >60)
Glucose, Bld: 93 mg/dL (ref 70–99)
Potassium: 3.9 mmol/L (ref 3.5–5.1)
Sodium: 142 mmol/L (ref 135–145)

## 2019-10-22 LAB — CBC
HCT: 38.7 % (ref 36.0–46.0)
Hemoglobin: 12.5 g/dL (ref 12.0–15.0)
MCH: 32.3 pg (ref 26.0–34.0)
MCHC: 32.3 g/dL (ref 30.0–36.0)
MCV: 100 fL (ref 80.0–100.0)
Platelets: 150 10*3/uL (ref 150–400)
RBC: 3.87 MIL/uL (ref 3.87–5.11)
RDW: 14.1 % (ref 11.5–15.5)
WBC: 7 10*3/uL (ref 4.0–10.5)
nRBC: 0 % (ref 0.0–0.2)

## 2019-10-22 LAB — PROTIME-INR
INR: 1.7 — ABNORMAL HIGH (ref 0.8–1.2)
Prothrombin Time: 19.1 seconds — ABNORMAL HIGH (ref 11.4–15.2)

## 2019-10-22 LAB — MAGNESIUM: Magnesium: 2.5 mg/dL — ABNORMAL HIGH (ref 1.7–2.4)

## 2019-10-22 MED ORDER — WARFARIN SODIUM 7.5 MG PO TABS
7.5000 mg | ORAL_TABLET | Freq: Once | ORAL | Status: AC
  Administered 2019-10-22: 7.5 mg via ORAL
  Filled 2019-10-22: qty 1

## 2019-10-22 NOTE — Progress Notes (Signed)
PROGRESS NOTE    Ashley Dunn  QIO:[id number] DOB: 01/15/1926 DOA: 10/19/2019 PCP: [name], MD    Chief Complaint  Patient presents with  . Chest Pain    Brief Narrative:  Ashley Dunn is a 86 y.o. female with medical history significant of A.Fib on coumadin, HFrEF with EF 46% as of March, appendicitis s/p appendectomy in April pathology showing carcinoma, for which pt indicates she just wants observation for the moment given her advanced age.  Pt presents to ED with c/o SOB, CP, nausea, fatigue. She has been having DOE for a while as well as BLE edema that has been worsening.  ED Course: O2 sats in the 80s initially, put on 2L via [place].  Has been taking torsemide at home.  BNP 475.4, trop neg x2.  CTA chest: neg for PE, does show trace pleural and pericardial effusions.   Assessment & Plan:   Principal Problem:   Acute on chronic systolic CHF (congestive heart failure) (HCC) Active Problems:   Permanent atrial fibrillation (HCC)   Hypertension   Chronic kidney disease (CKD), stage III (moderate)   Cancer of appendix (HCC)   Acute on chronic systolic (congestive) heart failure ([place])  1. Acute on chronic systolic CHF - 1. Lasix increased to 40mg  IV BID.  She still desaturating and requiring oxygen.  At baseline she is not on oxygen.  Continues to have bilateral lower extremity edema. Monitor BUN/trending closely while on the lasix. 2. Continue to monitor on tele 3. Just had 2d echo in March: EF 46%, mild PAH 4. Strict intake and output 5. Daily BMP 6. Patient and her daughter requesting cardiology evaluation. Consulted cardiology. 2. A.Fib - 1. Cont coumadin.  Pharmacy managing the Coumadin. 2. Cont cardizem 3. Cont metoprolol 3. HTN - 1. Cont cardizem 2. Cont metoprolol 3. Continue to monitor blood pressure closely and adjust medications as needed. 4. CKD stage 3 - 1. Monitor with diuresis 5. Cancer of appendix - 1. S/p appendectomy in April 2. Right  now pt doesn't want to pursue further given her age.   DVT prophylaxis: Coumadin Code Status: DNR Family Communication: Discussed with patient's daughter at bedside Disposition:   Status is: Inpatient  Remains inpatient appropriate because:Inpatient level of care appropriate due to severity of illness   Dispo: The patient is from: Home              Anticipated d/c is to: Home              Anticipated d/c date is: 2 days              Patient currently is not medically stable to d/c.  Continues to require oxygen, still has lower extremity edema.  Needing IV diuresis.    Consultants:   None  Procedures:  None   Antimicrobials:   None    Subjective: She desaturated when trying to be weaned off oxygen. Continues to remain on oxygen by nasal cannula. Still has some lower extremity edema. Denies having any chest pain at this time.  Objective: Vitals:   10/22/19 0748 10/22/19 0933 10/22/19 1059 10/22/19 1724  BP: 137/65 123/70 (!) 97/49 120/69  Pulse: (!) 58  67 (!) 47  Resp: 18  16 18   Temp: 98.3 F (36.8 C)  98.2 F (36.8 C) 97.9 F (36.6 C)  TempSrc: Oral  Oral Oral  SpO2: 92%  91% 98%  Weight:        Intake/Output Summary (Last 24 hours) at  10/22/2019 1729 Last data filed at 10/22/2019 1708 Gross per 24 hour  Intake 0 ml  Output 2000 ml  Net -2000 ml   Filed Weights   10/20/19 0500 10/21/19 0451 10/22/19 0300  Weight: 72.9 kg 71.8 kg 70.8 kg    Examination:  General exam: Appears calm and comfortable  Respiratory system: Clear to auscultation. Respiratory effort normal. Cardiovascular system: Irregularly irregular, lower extremity edema. Gastrointestinal system: Abdomen is nondistended, soft and nontender. No organomegaly or masses felt. Normal bowel sounds heard. Central nervous system: Alert and oriented. No focal neurological deficits. Extremities: Symmetric 5 x 5 power. Skin: No rashes, lesions or ulcers Psychiatry: Judgement and insight appear  normal. Mood & affect appropriate.     Data Reviewed: I have personally reviewed following labs and imaging studies  CBC: Recent Labs  Lab 10/19/19 1517 10/21/19 0342 10/22/19 0453  WBC 12.9* 9.2 7.0  HGB 13.5 12.5 12.5  HCT 41.9 38.6 38.7  MCV 99.1 100.3* 100.0  PLT 190 138* 371    Basic Metabolic Panel: Recent Labs  Lab 10/19/19 1517 10/20/19 0213 10/21/19 0342 10/22/19 0453  NA 138 139 139 142  K 3.6 3.6 4.1 3.9  CL 97* 97* 99 100  CO2 30 32 30 33*  GLUCOSE 159* 109* 92 93  BUN 39* 31* 37* 47*  CREATININE 1.35* 1.29* 1.20* 1.30*  CALCIUM 9.0 8.8* 8.7* 8.7*  MG  --   --  2.5* 2.5*    GFR: Estimated Creatinine Clearance: 24.7 mL/min (A) (by C-G formula based on SCr of 1.3 mg/dL (H)).  Liver Function Tests: Recent Labs  Lab 10/19/19 2045  AST 35  ALT 33  ALKPHOS 74  BILITOT 1.1  PROT 6.9  ALBUMIN 3.0*    CBG: No results for input(s): GLUCAP in the last 168 hours.   Recent Results (from the past 240 hour(s))  SARS Coronavirus 2 by RT PCR (hospital order, performed in [facility] hospital lab) Nasopharyngeal Nasopharyngeal Swab     Status: None   Collection Time: 10/19/19  9:07 PM   Specimen: Nasopharyngeal Swab  Result Value Ref Range Status   SARS Coronavirus 2 NEGATIVE NEGATIVE Final    Comment: (NOTE) SARS-CoV-2 target nucleic acids are NOT DETECTED.  The SARS-CoV-2 RNA is generally detectable in upper and lower respiratory specimens during the acute phase of infection. The lowest concentration of SARS-CoV-2 viral copies this assay can detect is 250 copies / mL. A negative result does not preclude SARS-CoV-2 infection and should not be used as the sole basis for treatment or other patient management decisions.  A negative result may occur with improper specimen collection / handling, submission of specimen other than nasopharyngeal swab, presence of viral mutation(s) within the areas targeted by this assay, and inadequate number of viral  copies (<250 copies / mL). A negative result must be combined with clinical observations, patient history, and epidemiological information.  Fact Sheet for Patients:   StrictlyIdeas.no  Fact Sheet for Healthcare Providers: BankingDealers.co.za  This test is not yet approved or  cleared by the [place] FDA and has been authorized for detection and/or diagnosis of SARS-CoV-2 by FDA under an Emergency Use Authorization (EUA).  This EUA will remain in effect (meaning this test can be used) for the duration of the COVID-19 declaration under Section 564(b)(1) of the Act, 21 U.S.C. section 360bbb-3(b)(1), unless the authorization is terminated or revoked sooner.  Performed at [facility], [place] [address] [place], [place] [address]  Radiology Studies: No results found.  Scheduled Meds: . furosemide  40 mg Intravenous BID  . metoprolol succinate  50 mg Oral BID  . pneumococcal 23 valent vaccine  0.5 mL Intramuscular Tomorrow-1000  . sodium chloride flush  3 mL Intravenous Q12H  . thiamine  100 mg Oral Daily  . thyroid  120 mg Oral QAC breakfast  . Warfarin - Pharmacist Dosing Inpatient   Does not apply q1600   Continuous Infusions: . sodium chloride       LOS: 2 days    Yaakov Guthrie, MD [facility] Pager on amion  To contact the attending provider between 7A-7P or the covering provider during after hours 7P-7A, please log into the web site [email or web address] and access using universal [place] password for that web site. If you do not have the password, please call the hospital operator.  10/22/2019, 5:29 PM

## 2019-10-22 NOTE — Consult Note (Signed)
CARDIOLOGY CONSULT NOTE  Patient ID: Ashley Dunn MRN: [id number] DOB/AGE: [birth year] 86 y.o.  Admit date: 10/19/2019 Referring Physician: [facility] hospitalist Reason for Consultation:  Shortness of breath  HPI:   86 y.o. Caucasian female with hyperthyroidism, controlled hypertension, persistent atrial flutter/fibrillation, h/o congestive heart failure, new diagnosis of appendix carcinoma, admitted with shortness of breath.   Patient was doing fairly well until 10/20/2019, when she woke up middle of night with retrosternal burning sensation.  She initially felt that this was indigestion.  However, it did not go away with eating papaya or drinking ginger ale.  Thus, she decided to was to the emergency department.  Either emergency department, she had a shortness of breath and leg edema.  She has been hospitalized since then, and has been on IV diuresis.   She was recently diagnosed with appendix carcinoma and underwent appendectomy.  Given her age, she had decided to pursue conservative approach and does not want to undergo any further tests or procedures.   Past Medical History:  Diagnosis Date  . Arthritis   . Atrial fibrillation ([place])   . Cataract   . CHF (congestive heart failure) ([place])   . Hypertension   . Osteoporosis   . Thyroid disease      Past Surgical History:  Procedure Laterality Date  . CESAREAN SECTION    . EYE SURGERY    . FRACTURE SURGERY    . IR CATHETER TUBE CHANGE  06/12/2019  . IR CATHETER TUBE CHANGE  08/05/2019  . IR RADIOLOGIST EVAL & MGMT  06/11/2019  . IR RADIOLOGIST EVAL & MGMT  06/25/2019  . IR RADIOLOGIST EVAL & MGMT  07/30/2019  . LAPAROSCOPIC APPENDECTOMY N/A 08/07/2019   Procedure: LAPAROSCOPIC  APPENDECTOMY;  Surgeon: Ralene Ok, MD;  Location: [facility];  Service: General;  Laterality: N/A;  . LAPAROSCOPY N/A 08/07/2019   Procedure: Laparoscopy Diagnostic and Lysis of Adhesions;  Surgeon: Ralene Ok, MD;  Location: [facility];  Service: General;   Laterality: N/A;     Family History  Problem Relation Age of Onset  . Stroke Mother   . Cancer Father   . Hyperlipidemia Brother   . Healthy Daughter   . Healthy Son   . Heart disease Maternal Grandmother   . Heart disease Maternal Grandfather      Social History: Social History   Socioeconomic History  . Marital status: Widowed    Spouse name: Not on file  . Number of children: Not on file  . Years of education: Not on file  . Highest education level: Not on file  Occupational History  . Not on file  Tobacco Use  . Smoking status: Never Smoker  . Smokeless tobacco: Never Used  Substance and Sexual Activity  . Alcohol use: Never  . Drug use: Never  . Sexual activity: Not Currently  Other Topics Concern  . Not on file  Social History Narrative   Lives at home with duaghter   Social Determinants of Health   Financial Resource Strain:   . Difficulty of Paying Living Expenses:   Food Insecurity:   . Worried About Charity fundraiser in the Last Year:   . Arboriculturist in the Last Year:   Transportation Needs:   . Film/video editor (Medical):   Marland Kitchen Lack of Transportation (Non-Medical):   Physical Activity:   . Days of Exercise per Week:   . Minutes of Exercise per Session:   Stress:   . Feeling of Stress :  Social Connections:   . Frequency of Communication with Friends and Family:   . Frequency of Social Gatherings with Friends and Family:   . Attends Religious Services:   . Active Member of Clubs or Organizations:   . Attends Archivist Meetings:   Marland Kitchen Marital Status:   Intimate Partner Violence:   . Fear of Current or Ex-Partner:   . Emotionally Abused:   Marland Kitchen Physically Abused:   . Sexually Abused:      Medications Prior to Admission  Medication Sig Dispense Refill Last Dose  . acetaminophen (TYLENOL) 500 MG tablet Take 2 tablets (1,000 mg total) by mouth every 6 (six) hours as needed for mild pain or fever.   Past Month at Unknown time   . Ascorbic Acid (VITAMIN C) 100 MG tablet Take 100 mg by mouth daily.   10/19/2019 at Unknown time  . Calcium 150 MG TABS Take 150 mg by mouth daily.   10/19/2019 at Unknown time  . Cholecalciferol (VITAMIN D3) 10 MCG (400 UNIT) CAPS Take 400 Units by mouth daily.   10/19/2019 at Unknown time  . diltiazem (CARDIZEM CD) 240 MG 24 hr capsule Take 240 mg by mouth 2 (two) times daily.    10/19/2019 at Unknown time  . Garlic 400 MG TABS Take 100 mg by mouth daily.   10/19/2019 at Unknown time  . lactobacillus acidophilus (BACID) TABS tablet Take 1 tablet by mouth daily with breakfast. 1 hr before breakfast and 2 hours after meds   10/19/2019 at Unknown time  . LINOLEIC ACID-SUNFLOWER OIL PO Take 1 capsule by mouth daily.   10/19/2019 at Unknown time  . metoprolol succinate (TOPROL-XL) 50 MG 24 hr tablet Take 1 tablet (50 mg total) by mouth in the morning and at bedtime. 180 tablet 2 10/19/2019 at 0400  . Omega-3 Fatty Acids (FISH OIL) 1000 MG CAPS Take 1,000 mg by mouth daily.   10/19/2019 at Unknown time  . thiamine (VITAMIN B-1) 100 MG tablet Take 100 mg by mouth daily.   10/19/2019 at Unknown time  . thyroid (ARMOUR THYROID) 120 MG tablet Take 1 tablet (120 mg total) by mouth daily before breakfast. 90 tablet 0 10/19/2019 at Unknown time  . torsemide (DEMADEX) 20 MG tablet Take 1 tablet (20 mg total) by mouth 2 (two) times daily. 90 tablet 3 10/19/2019 at Unknown time  . Turmeric (QC TUMERIC COMPLEX) 500 MG CAPS Take 500 mg by mouth daily.   10/19/2019 at Unknown time  . vitamin E 1000 UNIT capsule Take 1,000 Units by mouth daily.   10/19/2019 at Unknown time  . warfarin (COUMADIN) 5 MG tablet Take 2.5-5 mg by mouth See admin instructions. Take 5 mg by mouth on Mon, Wed, Fri, Sat & Sun after supper. Take 2.5 MG Tuesday, Thursday   10/18/2019 at 5-6pm    Review of Systems  Constitutional: Negative for decreased appetite, malaise/fatigue, weight gain and weight loss.  HENT: Negative for congestion.   Eyes:  Negative for visual disturbance.  Cardiovascular: Positive for dyspnea on exertion and leg swelling. Negative for chest pain, palpitations and syncope.  Respiratory: Positive for shortness of breath. Negative for cough.   Endocrine: Negative for cold intolerance.  Hematologic/Lymphatic: Does not bruise/bleed easily.  Skin: Negative for itching and rash.  Musculoskeletal: Negative for myalgias.  Gastrointestinal: Negative for abdominal pain, nausea and vomiting.  Genitourinary: Negative for dysuria.  Neurological: Negative for dizziness and weakness.  Psychiatric/Behavioral: The patient is not nervous/anxious.   All other  systems reviewed and are negative.     Physical Exam: Physical Exam Vitals and nursing note reviewed.  Constitutional:      General: She is not in acute distress.    Appearance: She is well-developed.  HENT:     Head: Normocephalic and atraumatic.  Eyes:     Conjunctiva/sclera: Conjunctivae normal.     Pupils: Pupils are equal, round, and reactive to light.  Neck:     Vascular: No JVD.  Cardiovascular:     Rate and Rhythm: Normal rate. Rhythm irregular.     Pulses: Normal pulses and intact distal pulses.     Heart sounds: No murmur heard.   Pulmonary:     Effort: Pulmonary effort is normal.     Breath sounds: No wheezing or rales.     Comments: Diminished air entry bilateral lung bases Abdominal:     General: Bowel sounds are normal.     Palpations: Abdomen is soft.     Tenderness: There is no rebound.  Musculoskeletal:        General: No tenderness. Normal range of motion.     Right lower leg: Edema (Trace) present.     Left lower leg: Edema (Trace) present.  Lymphadenopathy:     Cervical: No cervical adenopathy.  Skin:    General: Skin is warm and dry.  Neurological:     Mental Status: She is alert and oriented to person, place, and time.     Cranial Nerves: No cranial nerve deficit.      Labs:   Lab Results  Component Value Date   WBC  7.0 10/22/2019   HGB 12.5 10/22/2019   HCT 38.7 10/22/2019   MCV 100.0 10/22/2019   PLT 150 10/22/2019    Recent Labs  Lab 10/19/19 2045 10/20/19 0213 10/22/19 0453  NA  --    < > 142  K  --    < > 3.9  CL  --    < > 100  CO2  --    < > 33*  BUN  --    < > 47*  CREATININE  --    < > 1.30*  CALCIUM  --    < > 8.7*  PROT 6.9  --   --   BILITOT 1.1  --   --   ALKPHOS 74  --   --   ALT 33  --   --   AST 35  --   --   GLUCOSE  --    < > 93   < > = values in this interval not displayed.    Lipid Panel     Component Value Date/Time   CHOL 165 02/06/2019 1039   TRIG 67 02/06/2019 1039   HDL 48 02/06/2019 1039   CHOLHDL 3.4 02/06/2019 1039   LDLCALC 104 (H) 02/06/2019 1039    BNP (last 3 results) Recent Labs    05/29/19 1223 10/19/19 2045  BNP 445.7* 475.4*    Results for BREAHNA, BOYLEN (MRN [id number]) as of 10/22/2019 13:37  Ref. Range 10/19/2019 15:17 10/19/2019 17:47  Troponin I (High Sensitivity) Latest Ref Range: <18 ng/L 12 11    TSH Recent Labs    04/29/19 1023 07/31/19 1027 10/20/19 1006  TSH 0.043* 0.249* 1.451      Radiology: No results found.  Scheduled Meds: . diltiazem  240 mg Oral BID  . furosemide  40 mg Intravenous BID  . metoprolol succinate  50 mg Oral BID  .  pneumococcal 23 valent vaccine  0.5 mL Intramuscular Tomorrow-1000  . sodium chloride flush  3 mL Intravenous Q12H  . thiamine  100 mg Oral Daily  . thyroid  120 mg Oral QAC breakfast  . warfarin  7.5 mg Oral ONCE-1600  . Warfarin - Pharmacist Dosing Inpatient   Does not apply q1600   Continuous Infusions: . sodium chloride     PRN Meds:.sodium chloride, acetaminophen, ondansetron (ZOFRAN) IV, sodium chloride flush  CARDIAC STUDIES:  EKG 10/21/2019: Rate controlled Afib. Low voltage. Old anteroseptal infarct.   Echocardiogram 06/28/2019: Echocardiogram 06/28/2019:  1. Mildly depressed LV systolic function with visual EF 45-50%. Left  ventricle cavity is normal in size.  Mild left ventricular hypertrophy.  Mildly reduced global wall motion. Elevated LAP. Unable to evaluate  diastolic function due to underlying rhythm. Calculated EF 46%.  2. Left atrial cavity is severely dilated.  3. Visually, right atrial cavity is mild to moderately dilated.  4. Mild (Grade I) mitral regurgitation.  5. Moderate tricuspid regurgitation. Mild pulmonary hypertension. RVSP  measures 44 mmHg.  6. No prior study for comparison.    Assessment & Recommendations:  86 y.o. Caucasian female with hyperthyroidism, controlled hypertension, persistent atrial flutter/fibrillation, appendix carcinoma, admitted with acute on chronic systolic/diastolic heart failure  Acute on chronic systolic/diastolic heart failure: Currently on supplemental oxygen.  Improvement with diuresis. Continue IV Lasix 40 mg twice daily.  Hope to switch to p.o. tomorrow. Will obtain echocardiogram.  Given suspicion of systolic heart failure and slow ventricular rate, will stop diltiazem.  Permanent atrial fibrillation/flutter: Slow ventricular response.  In light of acute heart failure and relative bradycardia, will stop diltiazem.  Continue metoprolol succinate 50 mg daily for now. CHA2DS2VASc score 3, annual stroke risk 3.2%.  On warfarin, staying therapeutic.  Chest pain: Currently resolved.  High-sensitivity troponin negative.  Low suspicion for ACS.  Given patient's advanced age and multiple comorbidities, do not recommend any ischemic work-up.  Nigel Mormon, MD 10/22/2019, 12:46 PM [facility] Pager: [phone] Office: [phone] If no answer Cell [phone]

## 2019-10-22 NOTE — Progress Notes (Signed)
ANTICOAGULATION CONSULT NOTE - Follow-up Consult  Pharmacy Consult for Coumadin Indication: atrial fibrillation  No Known Allergies  Patient Measurements:   Vital Signs: Temp: 98.3 F (36.8 C) (06/29 0748) Temp Source: Oral (06/29 0748) BP: 123/70 (06/29 0933) Pulse Rate: 58 (06/29 0748)  Labs: Recent Labs     0000 10/19/19 1517 10/19/19 1517 10/19/19 1747 10/19/19 2045 10/20/19 0213 10/21/19 0342 10/22/19 0453  HGB  --  13.5   < >  --   --   --  12.5 12.5  HCT  --  41.9  --   --   --   --  38.6 38.7  PLT  --  190  --   --   --   --  138* 150  LABPROT  --   --   --   --    < > 18.8* 19.1* 19.1*  INR  --   --   --   --    < > 1.6* 1.7* 1.7*  CREATININE   < > 1.35*  --   --   --  1.29* 1.20* 1.30*  TROPONINIHS  --  12  --  11  --   --   --   --    < > = values in this interval not displayed.    Medical History: Past Medical History:  Diagnosis Date   Arthritis    Atrial fibrillation ([facility])    Cataract    CHF (congestive heart failure) ([facility])    Hypertension    Osteoporosis    Thyroid disease     Assessment: 86yo female c/o indigestion, admitted for CHF exacerbation, to continue Coumadin for Afib; pt was seen in outpt [facility] clinic on 6/22 and had low INR and had dose adjustment.  -INR= 1.7  Home dose: 5mg  daily except 2.5mg  on Wed/Fri per last clinic visit; 6/22 (INR 1.8)  Goal of Therapy:  INR 2-3   Plan:  -Coumadin 7.5mg  today -Daily PT/INR  Hildred Laser, PharmD Clinical Pharmacist **Pharmacist phone directory can now be found on [email or web address] ([email or web address]).  Listed under [place].

## 2019-10-22 NOTE — Evaluation (Signed)
Physical Therapy Evaluation Patient Details Name: Ashley Dunn MRN: [id number] DOB: 12-09-1925 Today's Date: 10/22/2019   History of Present Illness  Pt presents to ED with c/o SOB, CP, nausea, fatigue. She has been having DOE for a while as well as BLE edema. O2 requiring 2L O2 [place]. PMHx: A.Fib on coumadin, HFrEF with EF 46% as of March, appendicitis s/p appendectomy in April pathology showing carcinoma, CHF, HTN.  Clinical Impression  Pt was seen for mobility on RW as well as SPC.  Her safety was increased by using RW, O2 sats were noted to drop at rest on 1L O2 as is on in her room.  Pt has increased to 2L O2 and still could not control O2 sats at rest.  After increasing to 3 LO2, pt walked with drop to 88% during 3L O2.  Continued to monitor:  sitting BP 121/61, standing BP was 125/68, and noted that her O2 sats dropped to lower values.  Follow up with her to work on valuable progress, focusing on control of O2 sats, and to monitor safety and balance.    Follow Up Recommendations Home health PT;Supervision for mobility/OOB    Equipment Recommendations  Rolling walker with 5" wheels    Recommendations for Other Services       Precautions / Restrictions Precautions Precautions: Fall Precaution Comments: monitor O2 sats with gait Restrictions Weight Bearing Restrictions: No      Mobility  Bed Mobility Overal bed mobility: Needs Assistance Bed Mobility: Supine to Sit;Sit to Supine     Supine to sit: Supervision Sit to supine: Supervision      Transfers Overall transfer level: Needs assistance Equipment used: Rolling walker (2 wheeled);1 person hand held assist Transfers: Sit to/from Stand Sit to Stand: Min guard         General transfer comment: min guard for safety, monitoring O2 sats  Ambulation/Gait Ambulation/Gait assistance: Min guard Gait Distance (Feet): 160 Feet (100+60) Assistive device: Rolling walker (2 wheeled);Straight cane Gait Pattern/deviations:  Step-through pattern;Wide base of support Gait velocity: reduced Gait velocity interpretation: <1.31 ft/sec, indicative of household ambulator General Gait Details: unsteady on hallway x with RW is more stable and controlled OA pain  Stairs            Wheelchair Mobility    Modified Rankin (Stroke Patients Only)       Balance Overall balance assessment: Needs assistance Sitting-balance support: Feet supported Sitting balance-Leahy Scale: Fair     Standing balance support: Bilateral upper extremity supported;During functional activity Standing balance-Leahy Scale: Fair                               Pertinent Vitals/Pain Pain Assessment: No/denies pain    Home Living Family/patient expects to be discharged to:: Private residence Living Arrangements: Children Available Help at Discharge: Family;Available 24 hours/day Type of Home: House Home Access: Stairs to enter   [employer] of Steps: 1 Home Layout: One level Home Equipment: Walker - 4 wheels;Cane - single point;Bedside commode;Grab bars - toilet      Prior Function Level of Independence: Independent with assistive device(s)         Comments: rollator for gait in her house     Hand Dominance   Dominant Hand: Right    Extremity/Trunk Assessment   Upper Extremity Assessment Upper Extremity Assessment: Overall WFL for tasks assessed    Lower Extremity Assessment Lower Extremity Assessment: Overall WFL for tasks assessed  Cervical / Trunk Assessment Cervical / Trunk Assessment: Normal  Communication   Communication: HOH  Cognition Arousal/Alertness: Awake/alert Behavior During Therapy: WFL for tasks assessed/performed Overall Cognitive Status: Within Functional Limits for tasks assessed                                        General Comments General comments (skin integrity, edema, etc.): used  1L O2 as pt had on wall and cannot avoid desaturation  with sitting at rest.  Wi 3L O2 which was ordered,     Exercises Antenatal Exercises Ankle Circles/Pumps: 5 reps Hip ABduction/ADduction: Strengthening;10 reps   Assessment/Plan    PT Assessment Patient needs continued PT services  PT Problem List Decreased strength;Decreased range of motion;Decreased activity tolerance;Decreased mobility;Decreased balance;Decreased coordination;Decreased cognition;Cardiopulmonary status limiting activity       PT Treatment Interventions DME instruction;Gait training;Stair training;Functional mobility training;Therapeutic exercise;Therapeutic activities;Balance training;Neuromuscular re-education;Patient/family education    PT Goals (Current goals can be found in the Care Plan section)  Acute Rehab PT Goals Patient Stated Goal: none stated PT Goal Formulation: With patient Time For Goal Achievement: 10/29/19 Potential to Achieve Goals: Good    Frequency Min 3X/week   Barriers to discharge Inaccessible home environment;Decreased caregiver support limited tolerance for standing and gait    Co-evaluation               AM-PAC PT "6 Clicks" Mobility  Outcome Measure Help needed turning from your back to your side while in a flat bed without using bedrails?: A Little Help needed moving from lying on your back to sitting on the side of a flat bed without using bedrails?: A Little Help needed moving to and from a bed to a chair (including a wheelchair)?: A Lot Help needed standing up from a chair using your arms (e.g., wheelchair or bedside chair)?: A Lot Help needed to walk in hospital room?: A Lot Help needed climbing 3-5 steps with a railing? : Total 6 Click Score: 13    End of Session Equipment Utilized During Treatment: Gait belt Activity Tolerance: Patient tolerated treatment well;Patient limited by fatigue Patient left: in chair;with call bell/phone within reach;with chair alarm set Nurse Communication: Need for lift equipment PT  Visit Diagnosis: Unsteadiness on feet (R26.81);Muscle weakness (generalized) (M62.81);History of falling (Z91.81)    Time: 6222-9798 PT Time Calculation (min) (ACUTE ONLY): 40 min   Charges:   PT Evaluation $PT Eval Moderate Complexity: 1 Mod PT Treatments $Gait Training: 8-22 mins $Therapeutic Exercise: 8-22 mins       Ramond Dial 10/22/2019, 9:10 PM  Mee Hives, PT MS Acute Rehab Dept. Number: [place] [facility] [place]

## 2019-10-23 ENCOUNTER — Inpatient Hospital Stay (HOSPITAL_COMMUNITY): Payer: Medicare Other

## 2019-10-23 DIAGNOSIS — I5032 Chronic diastolic (congestive) heart failure: Secondary | ICD-10-CM

## 2019-10-23 LAB — BASIC METABOLIC PANEL
Anion gap: 11 (ref 5–15)
BUN: 46 mg/dL — ABNORMAL HIGH (ref 8–23)
CO2: 30 mmol/L (ref 22–32)
Calcium: 8.4 mg/dL — ABNORMAL LOW (ref 8.9–10.3)
Chloride: 103 mmol/L (ref 98–111)
Creatinine, Ser: 1.03 mg/dL — ABNORMAL HIGH (ref 0.44–1.00)
GFR calc Af Amer: 55 mL/min — ABNORMAL LOW (ref >60)
GFR calc non Af Amer: 47 mL/min — ABNORMAL LOW (ref >60)
Glucose, Bld: 92 mg/dL (ref 70–99)
Potassium: 4.2 mmol/L (ref 3.5–5.1)
Sodium: 144 mmol/L (ref 135–145)

## 2019-10-23 LAB — CBC
HCT: 38.6 % (ref 36.0–46.0)
Hemoglobin: 12.6 g/dL (ref 12.0–15.0)
MCH: 32 pg (ref 26.0–34.0)
MCHC: 32.6 g/dL (ref 30.0–36.0)
MCV: 98 fL (ref 80.0–100.0)
Platelets: 152 10*3/uL (ref 150–400)
RBC: 3.94 MIL/uL (ref 3.87–5.11)
RDW: 13.7 % (ref 11.5–15.5)
WBC: 6.4 10*3/uL (ref 4.0–10.5)
nRBC: 0 % (ref 0.0–0.2)

## 2019-10-23 LAB — PROTIME-INR
INR: 1.8 — ABNORMAL HIGH (ref 0.8–1.2)
Prothrombin Time: 20.1 seconds — ABNORMAL HIGH (ref 11.4–15.2)

## 2019-10-23 LAB — ECHOCARDIOGRAM COMPLETE: Weight: 2494.4 oz

## 2019-10-23 MED ORDER — WARFARIN SODIUM 7.5 MG PO TABS: 7.5000 mg | ORAL_TABLET | Freq: Once | ORAL | Status: DC

## 2019-10-23 MED ORDER — METOPROLOL SUCCINATE ER 50 MG PO TB24: 50.0000 mg | ORAL_TABLET | Freq: Every day | ORAL | 1 refills | Status: DC

## 2019-10-23 MED ORDER — FUROSEMIDE 40 MG PO TABS
40.0000 mg | ORAL_TABLET | Freq: Every day | ORAL | Status: DC
  Administered 2019-10-23: 40 mg via ORAL
  Filled 2019-10-23: qty 1

## 2019-10-23 MED ORDER — FUROSEMIDE 40 MG PO TABS: 40.0000 mg | ORAL_TABLET | Freq: Every day | ORAL | 2 refills | Status: DC

## 2019-10-23 NOTE — Discharge Summary (Addendum)
Physician Discharge Summary  Lincoln Kleiner LFY:[id number] DOB: 07-03-1925 DOA: 10/19/2019  PCP: [name], MD  Admit date: 10/19/2019 Discharge date: 10/23/2019  Admitted From: Home.  Disposition:  Home.   Recommendations for Outpatient Follow-up:  1. Follow up with PCP in 1-2 weeks 2. Please obtain BMP/CBC in one week Please follow up with cardiology as scheduled.   Discharge Condition:guarded.  CODE STATUS:full code.  Diet recommendation: Heart Healthy  Brief/Interim Summary:  Ashley Dunn a 86 y.o.femalewith medical history significant ofA.Fib on coumadin, HFrEF with EF 46% as of March, appendicitis s/p appendectomy in April pathology showing carcinoma, for which pt indicates she just wants observation for the moment given her advanced age. CTA chest: neg for PE, does show trace pleural and pericardial effusions. She presented to ED, with DOE and bilateral leg edema.  She was admitted for Acute CHF exacerbation.  Discharge Diagnoses:  Principal Problem:   Acute on chronic systolic CHF (congestive heart failure) (HCC) Active Problems:   Permanent atrial fibrillation (HCC)   Hypertension   Chronic kidney disease (CKD), stage III (moderate)   Cancer of appendix (HCC)   Acute on chronic systolic (congestive) heart failure (HCC)   Acute on chronic heart failure with preserved ejection fraction ([place])   1. Acute on chronic systolic CHF - Improved. She was started on IV lasix, with appropriate diuresis. Echocardiogram showed LVEF OF 46% with mild PAH. Cardiology consulted and recommendations given.  No further work up today.  2. A.Fib - rate controlled with bb, cardizem discontinued. On coumadin for anti coagulation. Her [place] 2 VASC2 score is 3.  3. HTN - well controlled on discharge. D/c cardizem as per cardiology. Continue with lasix and metoprolol.  4. CKD stage 3 - creatinine appears to be at baseline, no further work up.  5. Cancer of appendix - S/p appendectomy in  April. Pt does not want any further work up or treatment  at this time.  Discharge Instructions  Discharge Instructions    Diet - low sodium heart healthy   Complete by: As directed    Discharge instructions   Complete by: As directed    Please follow up with PCP IN one week.     Allergies as of 10/23/2019   No Known Allergies     Medication List    STOP taking these medications   diltiazem 240 MG 24 hr capsule Commonly known as: CARDIZEM CD   torsemide 20 MG tablet Commonly known as: DEMADEX     TAKE these medications   acetaminophen 500 MG tablet Commonly known as: TYLENOL Take 2 tablets (1,000 mg total) by mouth every 6 (six) hours as needed for mild pain or fever.   Calcium 150 MG Tabs Take 150 mg by mouth daily.   Fish Oil 1000 MG Caps Take 1,000 mg by mouth daily.   furosemide 40 MG tablet Commonly known as: LASIX Take 1 tablet (40 mg total) by mouth daily. Start taking on: October 23, 8525   Garlic 782 MG Tabs Take 100 mg by mouth daily.   lactobacillus acidophilus Tabs tablet Take 1 tablet by mouth daily with breakfast. 1 hr before breakfast and 2 hours after meds   LINOLEIC ACID-SUNFLOWER OIL PO Take 1 capsule by mouth daily.   metoprolol succinate 50 MG 24 hr tablet Commonly known as: TOPROL-XL Take 1 tablet (50 mg total) by mouth daily. Take with or immediately following a meal. What changed:   when to take this  additional instructions  QC Tumeric Complex 500 MG Caps Generic drug: Turmeric Take 500 mg by mouth daily.   thiamine 100 MG tablet Commonly known as: Vitamin B-1 Take 100 mg by mouth daily.   thyroid 120 MG tablet Commonly known as: Armour Thyroid Take 1 tablet (120 mg total) by mouth daily before breakfast.   vitamin C 100 MG tablet Take 100 mg by mouth daily.   Vitamin D3 10 MCG (400 UNIT) Caps Take 400 Units by mouth daily.   vitamin E 1000 UNIT capsule Take 1,000 Units by mouth daily.   warfarin 5 MG tablet Commonly  known as: COUMADIN Take as directed. If you are unsure how to take this medication, talk to your nurse or doctor. Original instructions: Take 2.5-5 mg by mouth See admin instructions. Take 5 mg by mouth on Mon, Wed, Fri, Sat & Sun after supper. Take 2.5 MG Tuesday, Thursday       Follow-up Information    Jacalyn Lefevre Jesse Sans, MD. Schedule an appointment as soon as possible for a visit in 1 week(s).   Specialty: Internal Medicine Contact information: [address] [place] [address] [phone]              No Known Allergies  Consultations:  Cardiology.    Procedures/Studies: DG Chest 2 View  Result Date: 10/19/2019 CLINICAL DATA:  Chest pain EXAM: CHEST - 2 VIEW COMPARISON:  05/29/2019 FINDINGS: Cardiomegaly. Aortic atherosclerosis. Mild vascular congestion. Bibasilar atelectasis or scarring. No effusions or overt edema. No acute bony abnormality. IMPRESSION: Cardiomegaly, vascular congestion. Bibasilar scarring or atelectasis. Electronically Signed   By: Rolm Baptise M.D.   On: 10/19/2019 15:39   CT Angio Chest PE W and/or Wo Contrast  Result Date: 10/19/2019 CLINICAL DATA:  Pain. Generalized weakness. EXAM: CT ANGIOGRAPHY CHEST WITH CONTRAST TECHNIQUE: Multidetector CT imaging of the chest was performed using the standard protocol during bolus administration of intravenous contrast. Multiplanar CT image reconstructions and MIPs were obtained to evaluate the vascular anatomy. CONTRAST:  71mL OMNIPAQUE IOHEXOL 350 MG/ML SOLN COMPARISON:  None. FINDINGS: Cardiovascular: Contrast injection is sufficient to demonstrate satisfactory opacification of the pulmonary arteries to the segmental level. There is no pulmonary embolus or evidence of right heart strain. The heart is enlarged. There is a trace pericardial effusion. Atherosclerotic changes are noted of the thoracic aorta without definite evidence for an aneurysm. There are coronary artery calcifications. Mediastinum/Nodes: --there are  few mildly enlarged mediastinal lymph nodes, presumably reactive. -- No hilar lymphadenopathy. -- No axillary lymphadenopathy. -- No supraclavicular lymphadenopathy. -- Normal thyroid gland where visualized. -  Unremarkable esophagus. Lungs/Pleura: There is a trace left-sided pleural effusion. There is atelectasis at the lung bases. There is no pneumothorax. The trachea is unremarkable. Upper Abdomen: Contrast bolus timing is not optimized for evaluation of the abdominal organs. The visualized portions of the organs of the upper abdomen are normal. Musculoskeletal: There is no acute displaced fracture. There is a subcutaneous soft tissue nodule involving the upper left anterior chest wall measuring approximately 1.8 cm (axial series 5, 15). This may represent a small sebaceous cyst. Review of the MIP images confirms the above findings. IMPRESSION: 1. No evidence for pulmonary embolus. 2. Trace left-sided pleural effusion with atelectasis at the lung bases. 3. Cardiomegaly with a trace pericardial effusion. Aortic Atherosclerosis (ICD10-I70.0). Electronically Signed   By: Constance Holster M.D.   On: 10/19/2019 21:16   ECHOCARDIOGRAM COMPLETE  Result Date: 10/23/2019    ECHOCARDIOGRAM REPORT   Patient Name:   Ashley Dunn  Date of Exam: 10/23/2019 Medical Rec #:  [id number]    Height:       60.0 in Accession #:    [id number]   Weight:       155.9 lb Date of Birth:  1925-11-22    BSA:          1.679 m Patient Age:    34 years     BP:           130/64 mmHg Patient Gender: F            HR:           60 bpm. Exam Location:  Inpatient Procedure: 2D Echo, Cardiac Doppler and Color Doppler Indications:     I42.9 Cardiomyopathy (unspecified)  History:         Patient has prior history of Echocardiogram examinations, most                  recent 06/28/2019. CHF, Arrythmias:Atrial Fibrillation; Risk                  Factors:Hypertension. Thyroid Disease.  Sonographer:     Jonelle Sidle Dance Referring Phys:  [id number] Reynold Bowen  PATWARDHAN Diagnosing Phys: Vernell Leep MD IMPRESSIONS  1. Left ventricular ejection fraction, by estimation, is 50 to 55%. The left ventricle has low normal function. The left ventricle has no regional wall motion abnormalities. Not assessed due to Afib.  2. Right ventricular systolic function is low normal. The right ventricular size is mildly enlarged. There is moderately elevated pulmonary artery systolic pressure.  3. Left atrial size was severely dilated.  4. Right atrial size was moderately dilated.  5. The mitral valve is grossly normal. Moderate mitral valve regurgitation.  6. Tricuspid valve regurgitation is moderate to severe. Estimated RVSP 45 mmHg  7. The aortic valve is tricuspid. Aortic valve regurgitation is not visualized. Mild aortic valve sclerosis is present, with no evidence of aortic valve stenosis.  8. The inferior vena cava is dilated in size with >50% respiratory variability, suggesting right atrial pressure of 8 mmHg.  9. No significant change compared to previous study on 06/28/2019. FINDINGS  Left Ventricle: Left ventricular ejection fraction, by estimation, is 50 to 55%. The left ventricle has low normal function. The left ventricle has no regional wall motion abnormalities. The left ventricular internal cavity size was normal in size. There is no left ventricular hypertrophy. Not assessed due to Afib. Right Ventricle: The right ventricular size is mildly enlarged. No increase in right ventricular wall thickness. Right ventricular systolic function is low normal. There is moderately elevated pulmonary artery systolic pressure. The tricuspid regurgitant  velocity is 3.08 m/s, and with an assumed right atrial pressure of 8 mmHg, the estimated right ventricular systolic pressure is 62.8 mmHg. Left Atrium: Left atrial size was severely dilated. Right Atrium: Right atrial size was moderately dilated. Pericardium: There is no evidence of pericardial effusion. Mitral Valve: The mitral valve  is grossly normal. Moderate mitral valve regurgitation. Tricuspid Valve: The tricuspid valve is grossly normal. Tricuspid valve regurgitation is moderate to severe. Aortic Valve: The aortic valve is tricuspid. Aortic valve regurgitation is not visualized. Mild aortic valve sclerosis is present, with no evidence of aortic valve stenosis. Pulmonic Valve: The pulmonic valve was grossly normal. Pulmonic valve regurgitation is mild. Aorta: The aortic root is normal in size and structure. Venous: The inferior vena cava is dilated in size with greater than 50% respiratory variability, suggesting right atrial pressure of 8 mmHg.  IAS/Shunts: No atrial level shunt detected by color flow Doppler.  LEFT VENTRICLE PLAX 2D LVIDd:         4.46 cm LVIDs:         3.20 cm LV PW:         1.41 cm LV IVS:        0.99 cm LVOT diam:     2.20 cm LV SV:         58 LV SV Index:   35 LVOT Area:     3.80 cm  RIGHT VENTRICLE          IVC RV Basal diam:  3.81 cm  IVC diam: 2.22 cm RV Mid diam:    2.46 cm TAPSE (M-mode): 1.1 cm LEFT ATRIUM             Index       RIGHT ATRIUM           Index LA diam:        4.50 cm 2.68 cm/m  RA Area:     24.10 cm LA Vol (A2C):   85.3 ml 50.80 ml/m RA Volume:   65.90 ml  39.25 ml/m LA Vol (A4C):   89.3 ml 53.18 ml/m LA Biplane Vol: 87.9 ml 52.35 ml/m  AORTIC VALVE LVOT Vmax:   76.95 cm/s LVOT Vmean:  48.250 cm/s LVOT VTI:    0.152 m  AORTA Ao Root diam: 3.40 cm Ao Asc diam:  3.20 cm MITRAL VALVE                TRICUSPID VALVE MV Area (PHT): 4.39 cm     TR Peak grad:   37.9 mmHg MV Decel Time: 173 msec     TR Vmax:        308.00 cm/s MV E velocity: 119.00 cm/s                             SHUNTS                             Systemic VTI:  0.15 m                             Systemic Diam: 2.20 cm Vernell Leep MD Electronically signed by Vernell Leep MD Signature Date/Time: 10/23/2019/12:48:30 PM    Final        Subjective: No new complaints.   Discharge Exam: Vitals:   10/23/19 0855  10/23/19 1153  BP: 130/64 140/69  Pulse: 78 70  Resp:    Temp:  98 F (36.7 C)  SpO2:  95%   Vitals:   10/23/19 0422 10/23/19 0758 10/23/19 0855 10/23/19 1153  BP: 127/66 120/74 130/64 140/69  Pulse: (!) 59 76 78 70  Resp: 18 17    Temp: 97.9 F (36.6 C) 97.9 F (36.6 C)  98 F (36.7 C)  TempSrc: Oral Oral  Oral  SpO2: 96% 97%  95%  Weight:        General: Pt is alert, awake, not in acute distress Cardiovascular: RRR, S1/S2 +, no rubs, no gallops Respiratory: CTA bilaterally, no wheezing, no rhonchi Abdominal: Soft, NT, ND, bowel sounds + Extremities: no edema, no cyanosis    The results of significant diagnostics from this hospitalization (including imaging, microbiology, ancillary and laboratory) are listed below for reference.  Microbiology: Recent Results (from the past 240 hour(s))  SARS Coronavirus 2 by RT PCR (hospital order, performed in [facility] hospital lab) Nasopharyngeal Nasopharyngeal Swab     Status: None   Collection Time: 10/19/19  9:07 PM   Specimen: Nasopharyngeal Swab  Result Value Ref Range Status   SARS Coronavirus 2 NEGATIVE NEGATIVE Final    Comment: (NOTE) SARS-CoV-2 target nucleic acids are NOT DETECTED.  The SARS-CoV-2 RNA is generally detectable in upper and lower respiratory specimens during the acute phase of infection. The lowest concentration of SARS-CoV-2 viral copies this assay can detect is 250 copies / mL. A negative result does not preclude SARS-CoV-2 infection and should not be used as the sole basis for treatment or other patient management decisions.  A negative result may occur with improper specimen collection / handling, submission of specimen other than nasopharyngeal swab, presence of viral mutation(s) within the areas targeted by this assay, and inadequate number of viral copies (<250 copies / mL). A negative result must be combined with clinical observations, patient history, and epidemiological  information.  Fact Sheet for Patients:   StrictlyIdeas.no  Fact Sheet for Healthcare Providers: BankingDealers.co.za  This test is not yet approved or  cleared by the [place] FDA and has been authorized for detection and/or diagnosis of SARS-CoV-2 by FDA under an Emergency Use Authorization (EUA).  This EUA will remain in effect (meaning this test can be used) for the duration of the COVID-19 declaration under Section 564(b)(1) of the Act, 21 U.S.C. section 360bbb-3(b)(1), unless the authorization is terminated or revoked sooner.  Performed at [facility], [place] [address], [place], [place] [address]      Labs: BNP (last 3 results) Recent Labs    05/29/19 1223 10/19/19 2045  BNP 445.7* 664.4*   Basic Metabolic Panel: Recent Labs  Lab 10/19/19 1517 10/20/19 0213 10/21/19 0342 10/22/19 0453 10/23/19 0327  NA 138 139 139 142 144  K 3.6 3.6 4.1 3.9 4.2  CL 97* 97* 99 100 103  CO2 30 32 30 33* 30  GLUCOSE 159* 109* 92 93 92  BUN 39* 31* 37* 47* 46*  CREATININE 1.35* 1.29* 1.20* 1.30* 1.03*  CALCIUM 9.0 8.8* 8.7* 8.7* 8.4*  MG  --   --  2.5* 2.5*  --    Liver Function Tests: Recent Labs  Lab 10/19/19 2045  AST 35  ALT 33  ALKPHOS 74  BILITOT 1.1  PROT 6.9  ALBUMIN 3.0*   Recent Labs  Lab 10/19/19 2045  LIPASE 25   No results for input(s): AMMONIA in the last 168 hours. CBC: Recent Labs  Lab 10/19/19 1517 10/21/19 0342 10/22/19 0453 10/23/19 0327  WBC 12.9* 9.2 7.0 6.4  HGB 13.5 12.5 12.5 12.6  HCT 41.9 38.6 38.7 38.6  MCV 99.1 100.3* 100.0 98.0  PLT 190 138* 150 152   Cardiac Enzymes: No results for input(s): CKTOTAL, CKMB, CKMBINDEX, TROPONINI in the last 168 hours. BNP: Invalid input(s): POCBNP CBG: No results for input(s): GLUCAP in the last 168 hours. D-Dimer No results for input(s): DDIMER in the last 72 hours. Hgb A1c No results for input(s): HGBA1C in the last 72  hours. Lipid Profile No results for input(s): CHOL, HDL, LDLCALC, TRIG, CHOLHDL, LDLDIRECT in the last 72 hours. Thyroid function studies No results for input(s): TSH, T4TOTAL, T3FREE, THYROIDAB in the last 72 hours.  Invalid input(s): FREET3 Anemia work up No results for input(s): VITAMINB12, FOLATE, FERRITIN, TIBC, IRON, RETICCTPCT in the last  72 hours. Urinalysis    Component Value Date/Time   COLORURINE YELLOW 08/05/2019 [place] 08/05/2019 0755   LABSPEC 1.015 08/05/2019 0755   PHURINE 6.0 08/05/2019 0755   GLUCOSEU NEGATIVE 08/05/2019 0755   HGBUR NEGATIVE 08/05/2019 0755   BILIRUBINUR NEGATIVE 08/05/2019 0755   KETONESUR NEGATIVE 08/05/2019 0755   PROTEINUR NEGATIVE 08/05/2019 0755   NITRITE NEGATIVE 08/05/2019 0755   LEUKOCYTESUR NEGATIVE 08/05/2019 0755   Sepsis Labs Invalid input(s): PROCALCITONIN,  WBC,  LACTICIDVEN Microbiology Recent Results (from the past 240 hour(s))  SARS Coronavirus 2 by RT PCR (hospital order, performed in [facility] hospital lab) Nasopharyngeal Nasopharyngeal Swab     Status: None   Collection Time: 10/19/19  9:07 PM   Specimen: Nasopharyngeal Swab  Result Value Ref Range Status   SARS Coronavirus 2 NEGATIVE NEGATIVE Final    Comment: (NOTE) SARS-CoV-2 target nucleic acids are NOT DETECTED.  The SARS-CoV-2 RNA is generally detectable in upper and lower respiratory specimens during the acute phase of infection. The lowest concentration of SARS-CoV-2 viral copies this assay can detect is 250 copies / mL. A negative result does not preclude SARS-CoV-2 infection and should not be used as the sole basis for treatment or other patient management decisions.  A negative result may occur with improper specimen collection / handling, submission of specimen other than nasopharyngeal swab, presence of viral mutation(s) within the areas targeted by this assay, and inadequate number of viral copies (<250 copies / mL). A negative  result must be combined with clinical observations, patient history, and epidemiological information.  Fact Sheet for Patients:   StrictlyIdeas.no  Fact Sheet for Healthcare Providers: BankingDealers.co.za  This test is not yet approved or  cleared by the [place] FDA and has been authorized for detection and/or diagnosis of SARS-CoV-2 by FDA under an Emergency Use Authorization (EUA).  This EUA will remain in effect (meaning this test can be used) for the duration of the COVID-19 declaration under Section 564(b)(1) of the Act, 21 U.S.C. section 360bbb-3(b)(1), unless the authorization is terminated or revoked sooner.  Performed at [facility], [place] [address], [place], [place] [address]      Time coordinating discharge: 35 minutes.   SIGNED:   Hosie Poisson, MD  [facility] 10/23/2019, 4:02 PM

## 2019-10-23 NOTE — Progress Notes (Signed)
  Echocardiogram 2D Echocardiogram has been performed.  Mohammedali Bedoy G Travia [place]stad 10/23/2019, 12:01 PM

## 2019-10-23 NOTE — Progress Notes (Signed)
Subjective:  Feels well. Breathing improved.   Objective:  Vital Signs in the last 24 hours: Temp:  [97.7 F (36.5 C)-98.3 F (36.8 C)] 97.9 F (36.6 C) (06/30 0422) Pulse Rate:  [47-67] 59 (06/30 0422) Resp:  [16-18] 18 (06/30 0422) BP: (97-137)/(49-70) 127/66 (06/30 0422) SpO2:  [91 %-98 %] 96 % (06/30 0422) Weight:  [70.7 kg] 70.7 kg (06/30 0414)  Intake/Output from previous day: 06/29 0701 - 06/30 0700 In: -  Out: [place] [Urine:1525]  Physical Exam Vitals and nursing note reviewed.  Constitutional:      General: She is not in acute distress.    Appearance: She is well-developed.  HENT:     Head: Normocephalic and atraumatic.  Eyes:     Conjunctiva/sclera: Conjunctivae normal.     Pupils: Pupils are equal, round, and reactive to light.  Neck:     Vascular: No JVD.  Cardiovascular:     Rate and Rhythm: Normal rate. Rhythm irregular.     Pulses: Normal pulses and intact distal pulses.     Heart sounds: No murmur heard.   Pulmonary:     Effort: Pulmonary effort is normal.     Breath sounds: Normal breath sounds. No wheezing or rales.  Abdominal:     General: Bowel sounds are normal.     Palpations: Abdomen is soft.     Tenderness: There is no rebound.  Musculoskeletal:        General: No tenderness. Normal range of motion.     Right lower leg: No edema.     Left lower leg: No edema.  Lymphadenopathy:     Cervical: No cervical adenopathy.  Skin:    General: Skin is warm and dry.  Neurological:     Mental Status: She is alert and oriented to person, place, and time.     Cranial Nerves: No cranial nerve deficit.      Lab Results: BMP Recent Labs    10/21/19 0342 10/22/19 0453 10/23/19 0327  NA 139 142 144  K 4.1 3.9 4.2  CL 99 100 103  CO2 30 33* 30  GLUCOSE 92 93 92  BUN 37* 47* 46*  CREATININE 1.20* 1.30* 1.03*  CALCIUM 8.7* 8.7* 8.4*  GFRNONAA 39* 36* 47*  GFRAA 46* 42* 55*    CBC Recent Labs  Lab 10/23/19 0327  WBC 6.4  RBC 3.94  HGB  12.6  HCT 38.6  PLT 152  MCV 98.0  MCH 32.0  MCHC 32.6  RDW 13.7   BNP (last 3 results) Recent Labs    05/29/19 1223 10/19/19 2045  BNP 445.7* 475.4*    TSH Recent Labs    04/29/19 1023 07/31/19 1027 10/20/19 1006  TSH 0.043* 0.249* 1.451    Lipid Panel     Component Value Date/Time   CHOL 165 02/06/2019 1039   TRIG 67 02/06/2019 1039   HDL 48 02/06/2019 1039   CHOLHDL 3.4 02/06/2019 1039   LDLCALC 104 (H) 02/06/2019 1039     Hepatic Function Panel Recent Labs    05/29/19 1223 08/05/19 0536 10/19/19 2045  PROT 7.3 6.7 6.9  ALBUMIN 2.6* 2.4* 3.0*  AST 23 24 35  ALT 18 18 33  ALKPHOS 74 66 74  BILITOT 1.0 1.2 1.1  BILIDIR  --   --  0.3*  IBILI  --   --  0.8    Cardiac Studies:  EKG 10/21/2019: Rate controlled Afib. Low voltage. Old anteroseptal infarct.   Echocardiogram 06/28/2019: Echocardiogram 06/28/2019:  1. Mildly depressed LV systolic function with visual EF 45-50%. Left  ventricle cavity is normal in size. Mild left ventricular hypertrophy.  Mildly reduced global wall motion. Elevated LAP. Unable to evaluate  diastolic function due to underlying rhythm. Calculated EF 46%.  2. Left atrial cavity is severely dilated.  3. Visually, right atrial cavity is mild to moderately dilated.  4. Mild (Grade I) mitral regurgitation.  5. Moderate tricuspid regurgitation. Mild pulmonary hypertension. RVSP  measures 44 mmHg.  6. No prior study for comparison.    Assessment & Recommendations:  86 y.o. Caucasian female with hyperthyroidism, controlled hypertension, persistent atrial flutter/fibrillation, appendix carcinoma, admitted with acute on chronic systolic/diastolic heart failure  Acute on chronic systolic/diastolic heart failure: Currently on supplemental oxygen.  Improvement with diuresis. Wean off oxygen today. Switch IV lasix 40 mg bid to PO lasix 40 mg daily.  Echocardiogram pending.  Permanent atrial fibrillation/flutter: Slow  ventricular response.  In light of acute heart failure and relative bradycardia, stopped diltiazem.  Continue metoprolol succinate 50 mg daily for now. CHA2DS2VASc score 3, annual stroke risk 3.2%.  On warfarin, staying therapeutic.  Chest pain: Currently resolved.  High-sensitivity troponin negative.  Low suspicion for ACS.  Given patient's advanced age and multiple comorbidities, do not recommend any ischemic work-up.  If she ambulates without oxygen and hypoxia, and if has no barriers for discharge, okay to discharge from cardiac standpoint.  Will arrange outpatient follow up.  Nigel Mormon, M.D. [facility], [place] Pager: [phone] Office: [phone]

## 2019-10-23 NOTE — Progress Notes (Addendum)
ANTICOAGULATION CONSULT NOTE - Follow-up Consult  Pharmacy Consult for Coumadin Indication: atrial fibrillation  No Known Allergies  Patient Measurements:   Vital Signs: Temp: 98 F (36.7 C) (06/30 1153) Temp Source: Oral (06/30 1153) BP: 140/69 (06/30 1153) Pulse Rate: 70 (06/30 1153)  Labs: Recent Labs    10/21/19 0342 10/21/19 0342 10/22/19 0453 10/23/19 0327  HGB 12.5   < > 12.5 12.6  HCT 38.6  --  38.7 38.6  PLT 138*  --  150 152  LABPROT 19.1*  --  19.1* 20.1*  INR 1.7*  --  1.7* 1.8*  CREATININE 1.20*  --  1.30* 1.03*   < > = values in this interval not displayed.    Medical History: Past Medical History:  Diagnosis Date  . Arthritis   . Atrial fibrillation ([place])   . Cataract   . CHF (congestive heart failure) ([place])   . Hypertension   . Osteoporosis   . Thyroid disease     Assessment: 86yo female c/o indigestion, admitted for CHF exacerbation, to continue Coumadin for Afib; pt was seen in outpt [facility] clinic on 6/22 and had low INR and had dose adjustment.  -INR= 1.8  Home dose: 5mg  daily except 2.5mg  on Wed/Fri per last clinic visit; 6/22 (INR 1.8)  Goal of Therapy:  INR 2-3   Plan:  Coumadin 7.5mg  again tonight if still here, if discharged would recommend 7.5mg  tonight then return to previous regimen on 7/1 with follow up INR next week if possible   Erin Hearing PharmD., BCPS Clinical Pharmacist 10/23/2019 12:14 PM

## 2019-10-23 NOTE — Progress Notes (Signed)
Ambulated with patient on room air. O2 sats remained low to mid 90's, lowest 89% on room air.

## 2019-10-23 NOTE — TOC Initial Note (Signed)
Transition of Care [facility]) - Initial/Assessment Note    Patient Details  Name: Haivyn Oravec MRN: [id number] Date of Birth: 1925-08-06  Transition of Care [facility]) CM/SW Contact:    Bethena Roys, RN Phone Number: 10/23/2019, 12:00 PM  Clinical Narrative:   Patient presented for shortness of breath. Prior to arrival patient was from home with the support of her daughter. Patient just recently stopped driving. Daughter at bedside at the time of conversation. Case Manager discussed with patient regarding home health services- patient is declining services at this time. Patient states she does exercises daily and will begin to walk more in the [place]sac. Patient no longer needs oxygen for home. Patient in the room with oxygen off at this time. Patient is aware to contact her primary care provider for orders if she needs home health services in the future. No further needs from Case Manager at this time.                 Expected Discharge Plan: Home/Self Care Barriers to Discharge: No Barriers Identified   Patient Goals and CMS Choice Patient states their goals for this hospitalization and ongoing recovery are:: to return home   Choice offered to / list presented to : NA  Expected Discharge Plan and Services Expected Discharge Plan: Home/Self Care In-house Referral: NA Discharge Planning Services: CM Consult Post Acute Care Choice: NA Living arrangements for the past 2 months: Single Family Home                 DME Arranged: N/A DME Agency: NA       HH Arranged: NA HH Agency: NA        Prior Living Arrangements/Services Living arrangements for the past 2 months: Single Family Home Lives with:: Adult Children Patient language and need for interpreter reviewed:: Yes        Need for Family Participation in Patient Care: Yes (Comment) Care giver support system in place?: Yes (comment)   Criminal Activity/Legal Involvement Pertinent to Current  Situation/Hospitalization: No - Comment as needed  Activities of Daily Living Home Assistive Devices/Equipment: Cane (specify quad or straight) (straight) ADL Screening (condition at time of admission) Patient's cognitive ability adequate to safely complete daily activities?: Yes Is the patient deaf or have difficulty hearing?: Yes Does the patient have difficulty seeing, even when wearing glasses/contacts?: No Does the patient have difficulty concentrating, remembering, or making decisions?: No Patient able to express need for assistance with ADLs?: Yes Does the patient have difficulty dressing or bathing?: No Independently performs ADLs?: Yes (appropriate for developmental age) Does the patient have difficulty walking or climbing stairs?: No Weakness of Legs: Both (pt states sometimes) Weakness of Arms/Hands: None  Permission Sought/Granted Permission sought to share information with : Family Supports, Case Manager                Emotional Assessment Appearance:: Appears stated age Attitude/Demeanor/Rapport: Engaged Affect (typically observed): Appropriate Orientation: : Oriented to Situation, Oriented to  Time, Oriented to Place, Oriented to Self Alcohol / Substance Use: Not Applicable Psych Involvement: No (comment)  Admission diagnosis:  Hypoxia [R09.02] Acute on chronic systolic CHF (congestive heart failure) (HCC) [I50.23] Edema, unspecified type [R60.9] Acute on chronic systolic (congestive) heart failure ([place]) [I50.23] Patient Active Problem List   Diagnosis Date Noted  . Acute on chronic systolic (congestive) heart failure ([place]) 10/20/2019  . Cancer of appendix ([place]) 10/19/2019  . Acute on chronic systolic CHF (congestive heart failure) ([place]) 10/19/2019  . Monitoring  for long-term anticoagulant use 10/15/2019  . Abscess of abdominal cavity ([place]) 08/05/2019  . Chronic kidney disease (CKD), stage III (moderate) 08/05/2019  . Pre-op evaluation 06/17/2019  .  Hypertension   . Osteoporosis   . Thyroid disease   . CHF (congestive heart failure) ([place])   . Arthritis   . Appendicitis with abscess 05/29/2019  . Atrial fibrillation ([place]) 03/19/2019  . Permanent atrial fibrillation ([place]) 02/26/2019   PCP:  [name], MD Pharmacy:   [facility] [phone] - [address], [place] AT [facility] [address] [name] [address] Phone: [phone] Fax: [place] - [place], [place] - [address] [address] [place] [address] Phone: [phone] Fax: [phone]   Readmission Risk Interventions Readmission Risk Prevention Plan 10/23/2019  Transportation Screening Complete  PCP or Specialist Appt within 3-5 Days Complete  HRI or [place] Complete  Social Work Consult for [place] Planning/Counseling Complete  Palliative Care Screening Not Applicable  Medication Review Press photographer) Complete

## 2019-11-07 ENCOUNTER — Telehealth: Payer: Self-pay

## 2019-11-07 ENCOUNTER — Encounter: Payer: Self-pay | Admitting: Cardiology

## 2019-11-07 ENCOUNTER — Other Ambulatory Visit: Payer: Self-pay

## 2019-11-07 ENCOUNTER — Ambulatory Visit: Payer: Medicare Other | Admitting: Cardiology

## 2019-11-07 VITALS — BP 140/87 | HR 96 | Ht 60.0 in | Wt 153.2 lb

## 2019-11-07 DIAGNOSIS — Z7901 Long term (current) use of anticoagulants: Secondary | ICD-10-CM

## 2019-11-07 DIAGNOSIS — I4821 Permanent atrial fibrillation: Secondary | ICD-10-CM

## 2019-11-07 LAB — POCT INR: INR: 1.6 — AB (ref 2.0–3.0)

## 2019-11-07 MED ORDER — FUROSEMIDE 40 MG PO TABS: 40.0000 mg | ORAL_TABLET | Freq: Every day | ORAL | 3 refills | Status: DC

## 2019-11-07 NOTE — Telephone Encounter (Signed)
Pt requested refilll for furosemide to her mail order

## 2019-11-07 NOTE — Progress Notes (Signed)
Patient referred by [name], MD for atrial fibrillation  Subjective:   Ashley Dunn, female    DOB: 1925/10/17, 86 y.o.   MRN: [id number]   Chief Complaint  Patient presents with  . Congestive Heart Failure    HPI  86 y.o. Caucasian female with hyperthyroidism, controlled hypertension, persistent atrial flutter/fibrillation,appendix carcinoma, admitted with acute on chronic systolic/diastolic heart failure  Patient was admitted to [facility] in June 2021 with acute on chronic diastolic heart failure.  She was diuresed with IV and p.o. Lasix with significant improvement in her symptoms.  In light of her slow ventricular response while in permanent atrial fibrillation, I stopped her diltiazem, and continue metoprolol succinate at 50 mg daily.  She did have chest pain on admission, but ACS was ruled out.  No further ischemic work-up was performed in the ER for advanced age and comorbidities.  She has been doing well since the discharge. She has not had any dyspnea, leg edema has resolved.   Current Outpatient Medications on File Prior to Visit  Medication Sig Dispense Refill  . acetaminophen (TYLENOL) 500 MG tablet Take 2 tablets (1,000 mg total) by mouth every 6 (six) hours as needed for mild pain or fever.    . Ascorbic Acid (VITAMIN C) 100 MG tablet Take 100 mg by mouth daily.    . Calcium 150 MG TABS Take 150 mg by mouth daily.    . Cholecalciferol (VITAMIN D3) 10 MCG (400 UNIT) CAPS Take 400 Units by mouth daily.    . Garlic 195 MG TABS Take 100 mg by mouth daily.    Marland Kitchen lactobacillus acidophilus (BACID) TABS tablet Take 1 tablet by mouth daily with breakfast. 1 hr before breakfast and 2 hours after meds    . LINOLEIC ACID-SUNFLOWER OIL PO Take 1 capsule by mouth daily.    . metoprolol succinate (TOPROL-XL) 50 MG 24 hr tablet Take 1 tablet (50 mg total) by mouth daily. Take with or immediately following a meal. 30 tablet 1  . Omega-3 Fatty Acids (FISH OIL) 1000 MG  CAPS Take 1,000 mg by mouth daily.    Marland Kitchen thiamine (VITAMIN B-1) 100 MG tablet Take 100 mg by mouth daily.    Marland Kitchen thyroid (ARMOUR THYROID) 120 MG tablet Take 1 tablet (120 mg total) by mouth daily before breakfast. 90 tablet 0  . Turmeric (QC TUMERIC COMPLEX) 500 MG CAPS Take 500 mg by mouth daily.    . vitamin E 1000 UNIT capsule Take 1,000 Units by mouth daily.    Marland Kitchen warfarin (COUMADIN) 5 MG tablet Take 2.5-5 mg by mouth See admin instructions. Take 5 mg by mouth on Mon, Wed, Fri, Sat & Sun after supper. Take 2.5 MG Tuesday, Thursday     No current facility-administered medications on file prior to visit.    Cardiovascular studies:  Echocardiogram 06/28/2019:  1. Mildly depressed LV systolic function with visual EF 45-50%. Left  ventricle cavity is normal in size. Mild left ventricular hypertrophy.  Mildly reduced global wall motion. Elevated LAP. Unable to evaluate  diastolic function due to underlying rhythm. Calculated EF 46%.  2. Left atrial cavity is severely dilated.  3. Visually, right atrial cavity is mild to moderately dilated.  4. Mild (Grade I) mitral regurgitation.  5. Moderate tricuspid regurgitation. Mild pulmonary hypertension. RVSP  measures 44 mmHg.  6. No prior study for comparison.  EKG 02/27/2019: Atypical atrial flutter with variable AV conduction, ventricular rate 96 bpm. Nonspecific ST depression  -  Nondiagnostic.   Recent labs: 10/23/2019: Glucose 92, BUN/Cr 46/1.03. EGFR 47. Na/K 144/4.2.  H/H 12.6/38.6. MCV 98. Platelets 152 INR 1.8 TSH 1.4 normal BNP 475 (10/19/2019)  01/2019: Chol 165, TG 67, HDL 48, LDL 104  05/29/2019: BNP 345  04/29/2019: TSH 0.043, free T4 1.5   Review of Systems  Cardiovascular: Negative for chest pain, dyspnea on exertion, leg swelling, palpitations and syncope.        Vitals:   11/07/19 1213  BP: 140/87  Pulse: 96  SpO2: 93%     Body mass index is 29.92 kg/m. Filed Weights   11/07/19 1213  Weight: 153 lb 3.2 oz  (69.5 kg)     Objective:   Physical Exam Vitals and nursing note reviewed.  Constitutional:      Appearance: She is well-developed.  Neck:     Vascular: No JVD.  Cardiovascular:     Rate and Rhythm: Normal rate. Rhythm irregularly irregular.     Pulses: Intact distal pulses.     Heart sounds: No murmur heard.      Comments: Bilateral LE varicocities Pulmonary:     Effort: Pulmonary effort is normal.     Breath sounds: Normal breath sounds. No wheezing or rales.  Musculoskeletal:     Right lower leg: No edema.     Left lower leg: No edema.         Assessment & Recommendations:    86 y.o. Caucasian female with hyperthyroidism, controlled hypertension, persistent atrial flutter/fibrillation,appendix carcinoma, admitted with acute on chronic systolic/diastolic heart failure  HFpEF: Acute decompensation in 09/2019, now resolved.  Continue lasix 40 mg daily.  I discussed adding losartan, but she is reluctant. Discussed low salt diet and regular weight checks.  Permanent atrial fibrillation/flutter: Rate well controlled on metoprolol succinate 50 mg daily.  CHA2DS2VASc score 3, annual stroke risk 3.2%. Goal: 2.0-3.0 Indication: Afib Today's INR: 1.6 Current dose: 2.5 mg Tue, Thu,  5 mg all other days New dose: 7.5 mg today, then 5 mg daily Next INR check: next week  F/u w/me in 6 months   [place], MD [facility] Pager: [phone] Office: [phone]

## 2019-11-14 ENCOUNTER — Other Ambulatory Visit: Payer: Self-pay

## 2019-11-14 ENCOUNTER — Ambulatory Visit: Payer: Medicare Other | Admitting: Pharmacist

## 2019-11-14 DIAGNOSIS — I4891 Unspecified atrial fibrillation: Secondary | ICD-10-CM

## 2019-11-14 DIAGNOSIS — Z7901 Long term (current) use of anticoagulants: Secondary | ICD-10-CM

## 2019-11-14 LAB — POCT INR: INR: 2.3 (ref 2.0–3.0)

## 2019-11-14 NOTE — Patient Instructions (Signed)
INR at goal. Continue weekly dose of 5 mg everyday. Recheck INR in 2 weeks

## 2019-11-14 NOTE — Progress Notes (Signed)
Anticoagulation Management Ashley Dunn is a 86 y.o. female who reports to the clinic for monitoring of warfarin treatment.    Indication: atrial fibrillation CHA2DS2 Vasc Score 5 (age >70, Female, CHF hx, HTN hx), HAS-BLED 1 (Age >73)   Duration: indefinite Supervising physician: [place] [facility] Visit History:  Patient does not report signs/symptoms of bleeding or thromboembolism   Other recent changes: No changes in diet, medications, lifestyle. Pt reports to continuing to feeling better since last OV. Reports to have made significant changes in her diet and limiting salt intake. Denies any complains of CP, edema, SOB, dyspnea.   Anticoagulation Episode Summary    Current INR goal:  2.0-3.0  TTR:  40.9 % (5.5 mo)  Next INR check:  12/03/2019  INR from last check:  2.3 (11/14/2019)  Weekly max warfarin dose:    Target end date:  Indefinite  INR check location:    Preferred lab:    Send INR reminders to:     Indications   Atrial fibrillation (HCC) [I48.91] Monitoring for long-term anticoagulant use [Z51.81 Z79.01]       Comments:          No Known Allergies  Current Outpatient Medications:  .  acetaminophen (TYLENOL) 500 MG tablet, Take 2 tablets (1,000 mg total) by mouth every 6 (six) hours as needed for mild pain or fever., Disp: , Rfl:  .  Ascorbic Acid (VITAMIN C) 100 MG tablet, Take 100 mg by mouth daily., Disp: , Rfl:  .  Calcium 150 MG TABS, Take 150 mg by mouth daily., Disp: , Rfl:  .  Cholecalciferol (VITAMIN D3) 10 MCG (400 UNIT) CAPS, Take 400 Units by mouth daily., Disp: , Rfl:  .  furosemide (LASIX) 40 MG tablet, Take 1 tablet (40 mg total) by mouth daily., Disp: 90 tablet, Rfl: 3 .  Garlic 086 MG TABS, Take 100 mg by mouth daily., Disp: , Rfl:  .  lactobacillus acidophilus (BACID) TABS tablet, Take 1 tablet by mouth daily with breakfast. 1 hr before breakfast and 2 hours after meds, Disp: , Rfl:  .  LINOLEIC ACID-SUNFLOWER OIL PO,  Take 1 capsule by mouth daily., Disp: , Rfl:  .  metoprolol succinate (TOPROL-XL) 50 MG 24 hr tablet, Take 1 tablet (50 mg total) by mouth daily. Take with or immediately following a meal., Disp: 30 tablet, Rfl: 1 .  Omega-3 Fatty Acids (FISH OIL) 1000 MG CAPS, Take 1,000 mg by mouth daily., Disp: , Rfl:  .  thiamine (VITAMIN B-1) 100 MG tablet, Take 100 mg by mouth daily., Disp: , Rfl:  .  thyroid (ARMOUR THYROID) 120 MG tablet, Take 1 tablet (120 mg total) by mouth daily before breakfast., Disp: 90 tablet, Rfl: 0 .  Turmeric (QC TUMERIC COMPLEX) 500 MG CAPS, Take 500 mg by mouth daily., Disp: , Rfl:  .  vitamin E 1000 UNIT capsule, Take 1,000 Units by mouth daily., Disp: , Rfl:  .  warfarin (COUMADIN) 5 MG tablet, Take 2.5-5 mg by mouth See admin instructions. Take 5 mg by mouth on Mon, Wed, Fri, Sat & Sun after supper. Take 2.5 MG Tuesday, Thursday, Disp: , Rfl:  Past Medical History:  Diagnosis Date  . Arthritis   . Atrial fibrillation ([place])   . Cataract   . CHF (congestive heart failure) ([place])   . Hypertension   . Osteoporosis   . Thyroid disease     ASSESSMENT  Recent Results: The most recent result is correlated with 35  mg per week:  Lab Results  Component Value Date   INR 2.3 11/14/2019   INR 1.6 (A) 11/07/2019   INR 1.8 (H) 10/23/2019    Anticoagulation Dosing: Description   INR at goal. Continue weekly dose of 5 mg everyday. Recheck INR in 2 weeks       INR today: Therapeutic following dose increase last week. Pt reports to be tolerating new dose well without any issues or complains. Denies any complains of bleeding or bruising symptoms. Denies any other relevant changes in her diet, medications, or lifestyle. Will continue close monitoring and follow up to ensure INR remains therapeutic.  PLAN Weekly dose was unchanged.Continue with weekly dose of 5 mg everyday. Recheck in 2 weeks  Patient Instructions  INR at goal. Continue weekly dose of 5 mg everyday. Recheck  INR in 2 weeks   Patient advised to contact clinic or seek medical attention if signs/symptoms of bleeding or thromboembolism occur.  Patient verbalized understanding by repeating back information and was advised to contact me if further medication-related questions arise.   Follow-up Return in about 19 days (around 12/03/2019).  [name], PharmD  15 minutes spent face-to-face with the patient during the encounter. 50% of time spent on education, including signs/sx bleeding and clotting, as well as food and drug interactions with warfarin. 50% of time was spent on fingerprick POC INR sample collection,processing, results determination, and documentation

## 2019-12-03 ENCOUNTER — Ambulatory Visit: Payer: Medicare Other | Admitting: Pharmacist

## 2019-12-03 ENCOUNTER — Other Ambulatory Visit: Payer: Self-pay

## 2019-12-03 DIAGNOSIS — I4891 Unspecified atrial fibrillation: Secondary | ICD-10-CM

## 2019-12-03 DIAGNOSIS — Z5181 Encounter for therapeutic drug level monitoring: Secondary | ICD-10-CM

## 2019-12-03 DIAGNOSIS — Z7901 Long term (current) use of anticoagulants: Secondary | ICD-10-CM

## 2019-12-03 LAB — POCT INR: INR: 2.3 (ref 2.0–3.0)

## 2019-12-03 NOTE — Patient Instructions (Signed)
INR at goal. Continue weekly dose of 5 mg everyday. Recheck INR in 5 weeks

## 2019-12-03 NOTE — Progress Notes (Signed)
Anticoagulation Management Ashley Dunn is a 86 y.o. female who reports to the clinic for monitoring of warfarin treatment.    Indication: atrial fibrillation CHA2DS2 Vasc Score 5 (age >23, Female, CHF hx, HTN hx), HAS-BLED 1 (Age >9)   Duration: indefinite Supervising physician: [place] [facility] Visit History:  Patient does not report signs/symptoms of bleeding or thromboembolism   Other recent changes: No changes in diet, medications, lifestyle. Reports to have made significant changes in her diet and limiting salt intake. Denies any complains of CP, edema, SOB, dyspnea.   Anticoagulation Episode Summary    Current INR goal:  2.0-3.0  TTR:  46.9 % (6.2 mo)  Next INR check:  01/07/2020  INR from last check:  2.3 (12/03/2019)  Weekly max warfarin dose:    Target end date:  Indefinite  INR check location:    Preferred lab:    Send INR reminders to:     Indications   Atrial fibrillation (HCC) [I48.91] Monitoring for long-term anticoagulant use [Z51.81 Z79.01]       Comments:          No Known Allergies  Current Outpatient Medications:  .  acetaminophen (TYLENOL) 500 MG tablet, Take 2 tablets (1,000 mg total) by mouth every 6 (six) hours as needed for mild pain or fever., Disp: , Rfl:  .  Ascorbic Acid (VITAMIN C) 100 MG tablet, Take 100 mg by mouth daily., Disp: , Rfl:  .  Calcium 150 MG TABS, Take 150 mg by mouth daily., Disp: , Rfl:  .  Cholecalciferol (VITAMIN D3) 10 MCG (400 UNIT) CAPS, Take 400 Units by mouth daily., Disp: , Rfl:  .  furosemide (LASIX) 40 MG tablet, Take 1 tablet (40 mg total) by mouth daily., Disp: 90 tablet, Rfl: 3 .  Garlic 073 MG TABS, Take 100 mg by mouth daily., Disp: , Rfl:  .  lactobacillus acidophilus (BACID) TABS tablet, Take 1 tablet by mouth daily with breakfast. 1 hr before breakfast and 2 hours after meds, Disp: , Rfl:  .  LINOLEIC ACID-SUNFLOWER OIL PO, Take 1 capsule by mouth daily., Disp: , Rfl:  .  metoprolol  succinate (TOPROL-XL) 50 MG 24 hr tablet, Take 1 tablet (50 mg total) by mouth daily. Take with or immediately following a meal., Disp: 30 tablet, Rfl: 1 .  Omega-3 Fatty Acids (FISH OIL) 1000 MG CAPS, Take 1,000 mg by mouth daily., Disp: , Rfl:  .  thiamine (VITAMIN B-1) 100 MG tablet, Take 100 mg by mouth daily., Disp: , Rfl:  .  thyroid (ARMOUR THYROID) 120 MG tablet, Take 1 tablet (120 mg total) by mouth daily before breakfast., Disp: 90 tablet, Rfl: 0 .  Turmeric (QC TUMERIC COMPLEX) 500 MG CAPS, Take 500 mg by mouth daily., Disp: , Rfl:  .  vitamin E 1000 UNIT capsule, Take 1,000 Units by mouth daily., Disp: , Rfl:  .  warfarin (COUMADIN) 5 MG tablet, Take 2.5-5 mg by mouth See admin instructions. Take 5 mg by mouth everyday. Refer to most recent anticoagulation note for most accurate dosing instructions., Disp: , Rfl:  Past Medical History:  Diagnosis Date  . Arthritis   . Atrial fibrillation ([place])   . Cataract   . CHF (congestive heart failure) ([place])   . Hypertension   . Osteoporosis   . Thyroid disease     ASSESSMENT  Recent Results: The most recent result is correlated with 35 mg per week:  Lab Results  Component Value Date  INR 2.3 12/03/2019   INR 2.3 11/14/2019   INR 1.6 (A) 11/07/2019    Anticoagulation Dosing: Description   INR at goal. Continue weekly dose of 5 mg everyday. Recheck INR in 5 weeks       INR today: Therapeutic continues to remain therapeutic on the current weekly dose. Denies any complains of bleeding or bruising symptoms. Denies any other relevant changes in her diet, medications, or lifestyle.   PLAN Weekly dose was unchanged.Continue with weekly dose of 5 mg everyday. Recheck in 5 weeks  Patient Instructions  INR at goal. Continue weekly dose of 5 mg everyday. Recheck INR in 5 weeks   Patient advised to contact clinic or seek medical attention if signs/symptoms of bleeding or thromboembolism occur.  Patient verbalized understanding by  repeating back information and was advised to contact me if further medication-related questions arise.   Follow-up Return in about 5 weeks (around 01/07/2020).  [name], PharmD  15 minutes spent face-to-face with the patient during the encounter. 50% of time spent on education, including signs/sx bleeding and clotting, as well as food and drug interactions with warfarin. 50% of time was spent on fingerprick POC INR sample collection,processing, results determination, and documentation

## 2020-01-07 ENCOUNTER — Ambulatory Visit: Payer: Medicare Other | Admitting: Pharmacist

## 2020-01-07 ENCOUNTER — Other Ambulatory Visit: Payer: Self-pay

## 2020-01-07 DIAGNOSIS — I4891 Unspecified atrial fibrillation: Secondary | ICD-10-CM

## 2020-01-07 DIAGNOSIS — Z7901 Long term (current) use of anticoagulants: Secondary | ICD-10-CM

## 2020-01-07 LAB — POCT INR: INR: 2.1 (ref 2.0–3.0)

## 2020-01-07 NOTE — Progress Notes (Signed)
Anticoagulation Management Ashley Dunn is a 86 y.o. female who reports to the clinic for monitoring of warfarin treatment.    Indication: atrial fibrillation CHA2DS2 Vasc Score 5 (age >78, Female, CHF hx, HTN hx), HAS-BLED 1 (Age >28)   Duration: indefinite Supervising physician: [facility] [facility] Visit History:  Patient does not report signs/symptoms of bleeding or thromboembolism   Other recent changes: No changes in diet, medications, lifestyle. Reports to remain consistent with changes in her diet and limiting salt intake. Denies any complains of CP, edema, SOB, dyspnea.   Anticoagulation Episode Summary    Current INR goal:  2.0-3.0  TTR:  55.4 % (7.3 mo)  Next INR check:  02/11/2020  INR from last check:  2.1 (01/07/2020)  Weekly max warfarin dose:    Target end date:  Indefinite  INR check location:    Preferred lab:    Send INR reminders to:     Indications   Atrial fibrillation (HCC) [I48.91] Monitoring for long-term anticoagulant use [Z51.81 Z79.01]       Comments:          No Known Allergies  Current Outpatient Medications:  .  acetaminophen (TYLENOL) 500 MG tablet, Take 2 tablets (1,000 mg total) by mouth every 6 (six) hours as needed for mild pain or fever., Disp: , Rfl:  .  Ascorbic Acid (VITAMIN C) 100 MG tablet, Take 100 mg by mouth daily., Disp: , Rfl:  .  Calcium 150 MG TABS, Take 150 mg by mouth daily., Disp: , Rfl:  .  Cholecalciferol (VITAMIN D3) 10 MCG (400 UNIT) CAPS, Take 400 Units by mouth daily., Disp: , Rfl:  .  furosemide (LASIX) 40 MG tablet, Take 1 tablet (40 mg total) by mouth daily., Disp: 90 tablet, Rfl: 3 .  Garlic 812 MG TABS, Take 100 mg by mouth daily., Disp: , Rfl:  .  lactobacillus acidophilus (BACID) TABS tablet, Take 1 tablet by mouth daily with breakfast. 1 hr before breakfast and 2 hours after meds, Disp: , Rfl:  .  LINOLEIC ACID-SUNFLOWER OIL PO, Take 1 capsule by mouth daily., Disp: , Rfl:  .   metoprolol succinate (TOPROL-XL) 50 MG 24 hr tablet, Take 1 tablet (50 mg total) by mouth daily. Take with or immediately following a meal., Disp: 30 tablet, Rfl: 1 .  Omega-3 Fatty Acids (FISH OIL) 1000 MG CAPS, Take 1,000 mg by mouth daily., Disp: , Rfl:  .  thiamine (VITAMIN B-1) 100 MG tablet, Take 100 mg by mouth daily., Disp: , Rfl:  .  thyroid (ARMOUR THYROID) 120 MG tablet, Take 1 tablet (120 mg total) by mouth daily before breakfast., Disp: 90 tablet, Rfl: 0 .  Turmeric (QC TUMERIC COMPLEX) 500 MG CAPS, Take 500 mg by mouth daily., Disp: , Rfl:  .  vitamin E 1000 UNIT capsule, Take 1,000 Units by mouth daily., Disp: , Rfl:  .  warfarin (COUMADIN) 5 MG tablet, Take 2.5-5 mg by mouth See admin instructions. Take 5 mg by mouth everyday. Refer to most recent anticoagulation note for most accurate dosing instructions., Disp: , Rfl:  Past Medical History:  Diagnosis Date  . Arthritis   . Atrial fibrillation ([place])   . Cataract   . CHF (congestive heart failure) ([place])   . Hypertension   . Osteoporosis   . Thyroid disease     ASSESSMENT  Recent Results: The most recent result is correlated with 35 mg per week:  Lab Results  Component Value Date  INR 2.1 01/07/2020   INR 2.3 12/03/2019   INR 2.3 11/14/2019    Anticoagulation Dosing: Description   INR at goal. Continue weekly dose of 5 mg everyday. Recheck INR in 5 weeks       INR today: Therapeutic continues to remain therapeutic on the current weekly dose. Denies any complains of bleeding or bruising symptoms. Denies any other relevant changes in her diet, medications, or lifestyle.   PLAN Weekly dose was unchanged.Continue with weekly dose of 5 mg everyday. Recheck in 5 weeks  Patient Instructions  INR at goal. Continue weekly dose of 5 mg everyday. Recheck INR in 5 weeks   Patient advised to contact clinic or seek medical attention if signs/symptoms of bleeding or thromboembolism occur.  Patient verbalized  understanding by repeating back information and was advised to contact me if further medication-related questions arise.   Follow-up Return in about 5 weeks (around 02/11/2020).  [name], PharmD  15 minutes spent face-to-face with the patient during the encounter. 50% of time spent on education, including signs/sx bleeding and clotting, as well as food and drug interactions with warfarin. 50% of time was spent on fingerprick POC INR sample collection,processing, results determination, and documentation

## 2020-01-07 NOTE — Patient Instructions (Signed)
INR at goal. Continue weekly dose of 5 mg everyday. Recheck INR in 5 weeks

## 2020-01-16 ENCOUNTER — Other Ambulatory Visit: Payer: Self-pay | Admitting: Pharmacist

## 2020-01-16 DIAGNOSIS — Z5181 Encounter for therapeutic drug level monitoring: Secondary | ICD-10-CM

## 2020-01-16 DIAGNOSIS — I4821 Permanent atrial fibrillation: Secondary | ICD-10-CM

## 2020-01-16 DIAGNOSIS — I4891 Unspecified atrial fibrillation: Secondary | ICD-10-CM

## 2020-01-16 MED ORDER — WARFARIN SODIUM 5 MG PO TABS: 5.0000 mg | ORAL_TABLET | Freq: Every day | ORAL | 3 refills | Status: DC

## 2020-02-01 ENCOUNTER — Emergency Department (HOSPITAL_COMMUNITY): Payer: Medicare Other

## 2020-02-01 ENCOUNTER — Observation Stay (HOSPITAL_COMMUNITY)
Admission: EM | Admit: 2020-02-01 | Discharge: 2020-02-02 | Disposition: A | Discharge status: Home / Self Care | Payer: Medicare Other | Attending: Internal Medicine | Admitting: Internal Medicine

## 2020-02-01 ENCOUNTER — Encounter (HOSPITAL_COMMUNITY): Payer: Self-pay

## 2020-02-01 ENCOUNTER — Observation Stay (HOSPITAL_BASED_OUTPATIENT_CLINIC_OR_DEPARTMENT_OTHER): Payer: Medicare Other

## 2020-02-01 ENCOUNTER — Other Ambulatory Visit: Payer: Self-pay

## 2020-02-01 DIAGNOSIS — C801 Malignant (primary) neoplasm, unspecified: Secondary | ICD-10-CM

## 2020-02-01 DIAGNOSIS — Z20822 Contact with and (suspected) exposure to covid-19: Secondary | ICD-10-CM | POA: Diagnosis not present

## 2020-02-01 DIAGNOSIS — Z79899 Other long term (current) drug therapy: Secondary | ICD-10-CM | POA: Diagnosis not present

## 2020-02-01 DIAGNOSIS — I5023 Acute on chronic systolic (congestive) heart failure: Secondary | ICD-10-CM | POA: Diagnosis not present

## 2020-02-01 DIAGNOSIS — I319 Disease of pericardium, unspecified: Secondary | ICD-10-CM | POA: Diagnosis present

## 2020-02-01 DIAGNOSIS — I130 Hypertensive heart and chronic kidney disease with heart failure and stage 1 through stage 4 chronic kidney disease, or unspecified chronic kidney disease: Secondary | ICD-10-CM | POA: Insufficient documentation

## 2020-02-01 DIAGNOSIS — R079 Chest pain, unspecified: Secondary | ICD-10-CM | POA: Diagnosis not present

## 2020-02-01 DIAGNOSIS — Z7901 Long term (current) use of anticoagulants: Secondary | ICD-10-CM | POA: Insufficient documentation

## 2020-02-01 DIAGNOSIS — N183 Chronic kidney disease, stage 3 unspecified: Secondary | ICD-10-CM | POA: Diagnosis not present

## 2020-02-01 DIAGNOSIS — I10 Essential (primary) hypertension: Secondary | ICD-10-CM | POA: Diagnosis present

## 2020-02-01 DIAGNOSIS — I4891 Unspecified atrial fibrillation: Secondary | ICD-10-CM | POA: Diagnosis present

## 2020-02-01 DIAGNOSIS — I309 Acute pericarditis, unspecified: Secondary | ICD-10-CM | POA: Diagnosis not present

## 2020-02-01 DIAGNOSIS — I509 Heart failure, unspecified: Secondary | ICD-10-CM

## 2020-02-01 LAB — BASIC METABOLIC PANEL
Anion gap: 12 (ref 5–15)
BUN: 32 mg/dL — ABNORMAL HIGH (ref 8–23)
CO2: 31 mmol/L (ref 22–32)
Calcium: 9.3 mg/dL (ref 8.9–10.3)
Chloride: 98 mmol/L (ref 98–111)
Creatinine, Ser: 1.09 mg/dL — ABNORMAL HIGH (ref 0.44–1.00)
GFR, Estimated: 44 mL/min — ABNORMAL LOW (ref >60)
Glucose, Bld: 134 mg/dL — ABNORMAL HIGH (ref 70–99)
Potassium: 3.6 mmol/L (ref 3.5–5.1)
Sodium: 141 mmol/L (ref 135–145)

## 2020-02-01 LAB — CBC
HCT: 47.4 % — ABNORMAL HIGH (ref 36.0–46.0)
Hemoglobin: 15.1 g/dL — ABNORMAL HIGH (ref 12.0–15.0)
MCH: 30.8 pg (ref 26.0–34.0)
MCHC: 31.9 g/dL (ref 30.0–36.0)
MCV: 96.7 fL (ref 80.0–100.0)
Platelets: 167 10*3/uL (ref 150–400)
RBC: 4.9 MIL/uL (ref 3.87–5.11)
RDW: 14 % (ref 11.5–15.5)
WBC: 9 10*3/uL (ref 4.0–10.5)
nRBC: 0 % (ref 0.0–0.2)

## 2020-02-01 LAB — TROPONIN I (HIGH SENSITIVITY)
Troponin I (High Sensitivity): 18 ng/L — ABNORMAL HIGH (ref <18)
Troponin I (High Sensitivity): 17 ng/L (ref <18)

## 2020-02-01 LAB — RESPIRATORY PANEL BY RT PCR (FLU A&B, COVID)
Influenza A by PCR: NEGATIVE
Influenza B by PCR: NEGATIVE
SARS Coronavirus 2 by RT PCR: NEGATIVE

## 2020-02-01 LAB — ECHOCARDIOGRAM COMPLETE
Height: 60 in
S' Lateral: 3.1 cm
Weight: 2469.15 oz

## 2020-02-01 LAB — TSH: TSH: 0.905 u[IU]/mL (ref 0.350–4.500)

## 2020-02-01 LAB — PROTIME-INR
INR: 1.4 — ABNORMAL HIGH (ref 0.8–1.2)
Prothrombin Time: 16.8 seconds — ABNORMAL HIGH (ref 11.4–15.2)

## 2020-02-01 MED ORDER — WARFARIN - PHARMACIST DOSING INPATIENT: Freq: Every day | Status: DC

## 2020-02-01 MED ORDER — IOHEXOL 350 MG/ML SOLN
100.0000 mL | Freq: Once | INTRAVENOUS | Status: AC | PRN
  Administered 2020-02-01: 100 mL via INTRAVENOUS

## 2020-02-01 MED ORDER — KETOROLAC TROMETHAMINE 15 MG/ML IJ SOLN
15.0000 mg | Freq: Once | INTRAMUSCULAR | Status: AC
  Administered 2020-02-01: 15 mg via INTRAVENOUS
  Filled 2020-02-01: qty 1

## 2020-02-01 MED ORDER — SODIUM CHLORIDE 0.9% FLUSH
3.0000 mL | Freq: Two times a day (BID) | INTRAVENOUS | Status: DC
  Administered 2020-02-01 (×2): 3 mL via INTRAVENOUS

## 2020-02-01 MED ORDER — SODIUM CHLORIDE 0.9% FLUSH: 3.0000 mL | INTRAVENOUS | Status: DC | PRN

## 2020-02-01 MED ORDER — METHYLPREDNISOLONE SODIUM SUCC 125 MG IJ SOLR
125.0000 mg | Freq: Once | INTRAMUSCULAR | Status: AC
  Administered 2020-02-01: 125 mg via INTRAVENOUS
  Filled 2020-02-01: qty 2

## 2020-02-01 MED ORDER — TRAMADOL HCL 50 MG PO TABS: 50.0000 mg | ORAL_TABLET | Freq: Three times a day (TID) | ORAL | Status: DC | PRN

## 2020-02-01 MED ORDER — FENTANYL CITRATE (PF) 100 MCG/2ML IJ SOLN
50.0000 ug | Freq: Once | INTRAMUSCULAR | Status: AC
  Administered 2020-02-01: 50 ug via INTRAVENOUS
  Filled 2020-02-01: qty 2

## 2020-02-01 MED ORDER — KETOROLAC TROMETHAMINE 15 MG/ML IJ SOLN: 15.0000 mg | Freq: Three times a day (TID) | INTRAMUSCULAR | Status: DC | PRN

## 2020-02-01 MED ORDER — WARFARIN SODIUM 7.5 MG PO TABS
7.5000 mg | ORAL_TABLET | Freq: Once | ORAL | Status: AC
  Administered 2020-02-01: 7.5 mg via ORAL
  Filled 2020-02-01 (×2): qty 1

## 2020-02-01 MED ORDER — SODIUM CHLORIDE 0.9 % IV SOLN: 250.0000 mL | INTRAVENOUS | Status: DC | PRN

## 2020-02-01 MED ORDER — HYDROCODONE-ACETAMINOPHEN 5-325 MG PO TABS: 1.0000 | ORAL_TABLET | ORAL | Status: DC | PRN

## 2020-02-01 NOTE — Progress Notes (Signed)
ANTICOAGULATION CONSULT NOTE - Initial Consult  Pharmacy Consult for warfarin Indication: atrial fibrillation  No Known Allergies  Patient Measurements: Height: 5' (152.4 cm) Weight: 70 kg (154 lb 5.2 oz) IBW/kg (Calculated) : 45.5  Vital Signs: Temp: 97.8 F (36.6 C) (10/09 0831) Temp Source: Oral (10/09 0831) BP: 139/76 (10/09 1200) Pulse Rate: 33 (10/09 1200)  Labs: Recent Labs    02/01/20 0622 02/01/20 0830  HGB 15.1*  --   HCT 47.4*  --   PLT 167  --   LABPROT  --  16.8*  INR  --  1.4*  CREATININE 1.09*  --   TROPONINIHS 18* 17    Estimated Creatinine Clearance: 28.7 mL/min (A) (by C-G formula based on SCr of 1.09 mg/dL (H)).   Medical History: Past Medical History:  Diagnosis Date  . Arthritis   . Atrial fibrillation ([place])   . Cataract   . CHF (congestive heart failure) ([place])   . Hypertension   . Osteoporosis   . Thyroid disease    Assessment: 53 yof presented to the ED with CP and back pain. She is on chronic warfarin for afib which will continue while admitted. INR is subtherapeutic at 1.4. No bleeding noted.   PTA warfarin regimen: 5mg  daily  Goal of Therapy:  INR 2-3 Monitor platelets by anticoagulation protocol: Yes   Plan:  Warfarin 7.5mg  PO x 1 today Daily INR  Garl Speigner, Rande Lawman 02/01/2020,12:53 PM

## 2020-02-01 NOTE — ED Notes (Signed)
Assisted pt with using bedside commode.No difficulties.

## 2020-02-01 NOTE — H&P (Signed)
History and Physical:    Ashley Dunn   HOZ:[id number] DOB: [birth year] DOA: 02/01/2020  Referring MD/provider: Dr. Billy Fischer PCP: [name], MD   Patient coming from: Home  Chief Complaint: Back pain and pleuritic chest pain since early this morning  History of Present Illness:   Ashley Dunn is an 86 y.o. female with PMH significant for HTN, A. fib on Coumadin, HFrEF, osteoporosis and recent diagnosis of appendiceal carcinoma s/p appendectomy, now being followed with watchful waiting given advanced age who was in her usual state of good health living at home independently until last night when she developed back pain on her way back from the bathroom.  Patient states that she has chronic back pain "because I have osteoporosis you know but I try to do my exercises every morning" but last night on her way back from the bathroom she noted her back pain had worsened significantly. She tried to self treat with Tylenol without good effect. She noted that when she sat up she felt better. She subsequently noted pain in her anterior chest which she had never felt before. Patient describes pain as sharp and stabbing but occurs only when she takes a deep breath. Patient has not had similar chest pain in the past. Patient states that the chest pain and back pain continue to worsen and so called the ambulance.  Patient denies recent fevers or chills but does admit to feeling "briefly flushed" yesterday without recurrence.  She generally is very active, does note that she may have pulled her back when she made her daughter's bed last week and then again yesterday when she was manipulating the relatively heavy and stuck food processor to make salmon cakes.  She did not have acute pain after either of these events but did feel that she may have "pulled something".  Patient does admit to feeling intermittently dizzy over the past couple of days however.  No syncope or presyncope.  Just episodes of  feeling slightly dizzy which passed spontaneously.   ED Course:  The patient was noted to be afebrile with essentially normal vital signs.  Patient underwent imaging to evaluate for PE and or aortic dissection.  CT angiogram of chest abdomen pelvis was negative for any evidence of PE or aortic dissection.  She was noted to have a moderate compression fracture around T12 which was unchanged, along with her baseline lumbar DDD.  Patient was also noted to have some hilar and mediastinal adenopathy as well as a 3.3 cm right lower lobe "subpleural masslike opacity" which was increased in size from 2.6 cm in February 2021.  She was also noted to have cardiomegaly and small Pericardial effusion which had increased in size from prior.  Patient was also noted to have a 5 cm fluid collection/phlegmon in the right lower quadrant which has increased in size to 1.  7 cm, up from 1.2 cm.  It was treated with fentanyl, Solu-Medrol and Toradol.  ROS:   ROS   Review of Systems: General: Denies fever, chills, malaise,  Respiratory: Denies cough, SOB at rest or hemoptysis Cardiovascular: Denies chest pain or palpitations GI: Denies nausea, vomiting, diarrhea or constipation GU: Denies dysuria, frequency or hematuria CNS: Denies HA, dizziness, confusion, new weakness or clumsiness. Blood/lymphatics: Denies easy bruising or bleeding Mood/affect: Denies anxiety/depression    Past Medical History:   Past Medical History:  Diagnosis Date  . Arthritis   . Atrial fibrillation ([place])   . Cataract   . CHF (congestive  heart failure) ([place])   . Hypertension   . Osteoporosis   . Thyroid disease     Past Surgical History:   Past Surgical History:  Procedure Laterality Date  . CESAREAN SECTION    . EYE SURGERY    . FRACTURE SURGERY    . IR CATHETER TUBE CHANGE  06/12/2019  . IR CATHETER TUBE CHANGE  08/05/2019  . IR RADIOLOGIST EVAL & MGMT  06/11/2019  . IR RADIOLOGIST EVAL & MGMT  06/25/2019  . IR RADIOLOGIST  EVAL & MGMT  07/30/2019  . LAPAROSCOPIC APPENDECTOMY N/A 08/07/2019   Procedure: LAPAROSCOPIC  APPENDECTOMY;  Surgeon: Ralene Ok, MD;  Location: [place];  Service: General;  Laterality: N/A;  . LAPAROSCOPY N/A 08/07/2019   Procedure: Laparoscopy Diagnostic and Lysis of Adhesions;  Surgeon: Ralene Ok, MD;  Location: [facility];  Service: General;  Laterality: N/A;    Social History:   Social History   Socioeconomic History  . Marital status: Widowed    Spouse name: Not on file  . Number of children: Not on file  . Years of education: Not on file  . Highest education level: Not on file  Occupational History  . Not on file  Tobacco Use  . Smoking status: Never Smoker  . Smokeless tobacco: Never Used  Substance and Sexual Activity  . Alcohol use: Never  . Drug use: Never  . Sexual activity: Not Currently  Other Topics Concern  . Not on file  Social History Narrative   Lives at home with duaghter   Social Determinants of Health   Financial Resource Strain:   . Difficulty of Paying Living Expenses: Not on file  Food Insecurity:   . Worried About Charity fundraiser in the Last Year: Not on file  . Ran Out of Food in the Last Year: Not on file  Transportation Needs:   . Lack of Transportation (Medical): Not on file  . Lack of Transportation (Non-Medical): Not on file  Physical Activity:   . Days of Exercise per Week: Not on file  . Minutes of Exercise per Session: Not on file  Stress:   . Feeling of Stress : Not on file  Social Connections:   . Frequency of Communication with Friends and Family: Not on file  . Frequency of Social Gatherings with Friends and Family: Not on file  . Attends Religious Services: Not on file  . Active Member of Clubs or Organizations: Not on file  . Attends Archivist Meetings: Not on file  . Marital Status: Not on file  Intimate Partner Violence:   . Fear of Current or Ex-Partner: Not on file  . Emotionally Abused: Not on file    . Physically Abused: Not on file  . Sexually Abused: Not on file    Allergies   Patient has no known allergies.  Family history:   Family History  Problem Relation Age of Onset  . Stroke Mother   . Cancer Father   . Hyperlipidemia Brother   . Healthy Daughter   . Healthy Son   . Heart disease Maternal Grandmother   . Heart disease Maternal Grandfather     Current Medications:   Prior to Admission medications   Medication Sig Start Date End Date Taking? Authorizing Provider  acetaminophen (TYLENOL) 500 MG tablet Take 2 tablets (1,000 mg total) by mouth every 6 (six) hours as needed for mild pain or fever. 08/09/19   Saverio Danker, PA-C  Ascorbic Acid (VITAMIN C)  100 MG tablet Take 100 mg by mouth daily.    [provider]  Calcium 150 MG TABS Take 150 mg by mouth daily.    [provider]  Cholecalciferol (VITAMIN D3) 10 MCG (400 UNIT) CAPS Take 400 Units by mouth daily.    [provider]  furosemide (LASIX) 40 MG tablet Take 1 tablet (40 mg total) by mouth daily. 11/07/19   Patwardhan, Reynold Bowen, MD  Garlic 093 MG TABS Take 100 mg by mouth daily.    [provider]  lactobacillus acidophilus (BACID) TABS tablet Take 1 tablet by mouth daily with breakfast. 1 hr before breakfast and 2 hours after meds    [provider]  LINOLEIC ACID-SUNFLOWER OIL PO Take 1 capsule by mouth daily.    [provider]  metoprolol succinate (TOPROL-XL) 50 MG 24 hr tablet Take 1 tablet (50 mg total) by mouth daily. Take with or immediately following a meal. 10/23/19   Hosie Poisson, MD  Omega-3 Fatty Acids (FISH OIL) 1000 MG CAPS Take 1,000 mg by mouth daily.    [provider]  thiamine (VITAMIN B-1) 100 MG tablet Take 100 mg by mouth daily.    [provider]  thyroid (ARMOUR THYROID) 120 MG tablet Take 1 tablet (120 mg total) by mouth daily before breakfast. 08/02/19   Forrest Moron, MD  Turmeric (QC TUMERIC COMPLEX) 500 MG  CAPS Take 500 mg by mouth daily.    [provider]  vitamin E 1000 UNIT capsule Take 1,000 Units by mouth daily.    [provider]  warfarin (COUMADIN) 5 MG tablet Take 1 tablet (5 mg total) by mouth daily. Refer to most recent anticoagulation note for most accurate dosing instructions. 01/16/20   Nigel Mormon, MD    Physical Exam:   Vitals:   02/01/20 0945 02/01/20 1010 02/01/20 1030 02/01/20 1130  BP: 135/71 92/64 126/67 138/70  Pulse: (!) 56 (!) 34 (!) 36 90  Resp: (!) 23 18 (!) 21 20  Temp:      TempSrc:      SpO2: 91% 91% 94% 95%  Weight:      Height:         Physical Exam: Blood pressure 138/70, pulse 90, temperature 97.8 F (36.6 C), temperature source Oral, resp. rate 20, height 5' (1.524 m), weight 70 kg, SpO2 95 %. Gen: Spry female lying flat in bed looking younger than stated age in no acute distress Eyes: sclera anicteric, conjuctiva mildly injected bilaterally CVS: S1-S2, irregular  Respiratory: Reasonable air entry bilaterally with no adventitious sounds. GI: NABS, soft, NT to light or deep palpation.  No rebound tenderness.  Unable to feel any masses. Back: Patient does have an area of point tenderness to the right around T8-T9 which is reproducible by palpation.  It seems to be in the fleshy part of the muscle versus the joint.  Patient does not have specific bony tenderness. LE: 2-3+ edema with superficial varicosities. Neuro: A/O x 3, Moving all extremities equally with normal strength, CN 3-12 intact, grossly nonfocal.  Psych: patient is logical and coherent, judgement and insight appear normal, mood and affect appropriate to situation. Skin: no rashes or lesions or ulcers,    Data Review:    Labs: Basic Metabolic Panel: Recent Labs  Lab 02/01/20 0622  NA 141  K 3.6  CL 98  CO2 31  GLUCOSE 134*  BUN 32*  CREATININE 1.09*  CALCIUM 9.3   Liver Function Tests:  No results for input(s): AST, ALT, ALKPHOS, BILITOT, PROT,  ALBUMIN in the last 168 hours. No results for input(s): LIPASE, AMYLASE in the last 168 hours. No results for input(s): AMMONIA in the last 168 hours. CBC: Recent Labs  Lab 02/01/20 0622  WBC 9.0  HGB 15.1*  HCT 47.4*  MCV 96.7  PLT 167   Cardiac Enzymes: No results for input(s): CKTOTAL, CKMB, CKMBINDEX, TROPONINI in the last 168 hours.  BNP (last 3 results) No results for input(s): PROBNP in the last 8760 hours. CBG: No results for input(s): GLUCAP in the last 168 hours.  Urinalysis    Component Value Date/Time   COLORURINE YELLOW 08/05/2019 [place] 08/05/2019 0755   LABSPEC 1.015 08/05/2019 0755   PHURINE 6.0 08/05/2019 0755   GLUCOSEU NEGATIVE 08/05/2019 0755   HGBUR NEGATIVE 08/05/2019 0755   BILIRUBINUR NEGATIVE 08/05/2019 0755   KETONESUR NEGATIVE 08/05/2019 0755   PROTEINUR NEGATIVE 08/05/2019 0755   NITRITE NEGATIVE 08/05/2019 0755   LEUKOCYTESUR NEGATIVE 08/05/2019 0755      Radiographic Studies: DG Chest 2 View  Result Date: 02/01/2020 CLINICAL DATA:  Chest pain EXAM: CHEST - 2 VIEW COMPARISON:  October 19, 2019 FINDINGS: Mediastinal contour is normal. The aorta is tortuous. Heart size is enlarged. There is no focal infiltrate, pulmonary edema, or pleural effusion. Degenerative joint changes of the spine with scoliosis are noted. IMPRESSION: No active cardiopulmonary disease.  Cardiomegaly unchanged. Electronically Signed   By: Abelardo Diesel M.D.   On: 02/01/2020 08:55   CT Angio Chest/Abd/Pel for Dissection W and/or Wo Contrast  Result Date: 02/01/2020 CLINICAL DATA:  Chest/back pain.  Evaluate for dissection. EXAM: CT ANGIOGRAPHY CHEST, ABDOMEN AND PELVIS TECHNIQUE: Non-contrast CT of the chest was initially obtained. Multidetector CT imaging through the chest, abdomen and pelvis was performed using the standard protocol during bolus administration of intravenous contrast. Multiplanar reconstructed images and MIPs were obtained and reviewed to  evaluate the vascular anatomy. CONTRAST:  159mL OMNIPAQUE IOHEXOL 350 MG/ML SOLN COMPARISON:  Chest CT-10/19/2019; CT abdomen and pelvis-08/05/2019; 05/29/2019 FINDINGS: CTA CHEST FINDINGS Vascular Findings: Review of the precontrast images is negative for the presence of an intramural hematoma. No evidence of thoracic aortic aneurysm or dissection on this nongated examination. There is a large amount of eccentric predominantly calcified atherosclerotic plaque throughout the thoracic aorta, including slightly irregular noncalcified plaque/mural thrombus involving the posterior aspect of the aortic arch (image 26, series 6), not resulting in a hemodynamically significant stenosis. Cardiomegaly. Small pericardial effusion, increased in size compared to the 10/19/2019 examination. Coronary artery calcifications. Although this examination was not tailored for the evaluation the pulmonary arteries, there are no discrete filling defects within the central pulmonary arterial tree to suggest central pulmonary embolism. Borderline enlarged caliber of the main pulmonary artery measuring 32 mm in diameter. ------------------------------------------------------------- Thoracic aortic measurements: Sinotubular junction 29 mm as measured in greatest oblique short axis coronal dimension. Proximal ascending aorta 32 mm as measured in greatest oblique short axis axial dimension at the level of the main pulmonary artery and 32 mm in greatest oblique short axis coronal diameter (coronal image 59, series 9) Aortic arch aorta 29 mm as measured in greatest oblique short axis sagittal dimension. Proximal descending thoracic aorta 25 mm as measured in greatest oblique short axis axial dimension at the level of the main pulmonary artery. Distal descending thoracic aorta 23 mm as measured in greatest oblique short axis axial dimension at the level of the diaphragmatic hiatus. Review of the MIP  images confirms the above findings.  ------------------------------------------------------------- Non-Vascular Findings: Mediastinum/Lymph Nodes: Scattered mediastinal hilar lymph nodes are unchanged to slightly reduced in size in the interval with index AP window lymph node now measuring 0.9 cm in greatest short axis diameter (image 34, series 6, previously, 1.3 cm. Bilateral hilar lymph nodes are grossly unchanged with index right suprahilar and index left infrahilar lymph nodes both measuring approximately 1 cm greatest short axis diameter (right-image 51, series 6; left-image 56, series 6). Lungs/Pleura: Mild apical predominant centrilobular emphysematous change. Minimal dependent subpleural ground-glass atelectasis. Subpleural nodular airspace opacity involving the subpleural aspect of the right lower lobe has increased in size, currently measuring 3.3 x 1.2 cm (image 91, series 7, previously, 2.6 x 1.1 cm when compared to abdominal CT performed 05/29/2019. Musculoskeletal: No acute or aggressive osseous abnormalities. Stigmata of dish within the thoracic spine. The thyroid appears atrophic. Note is made of an approximately 1.8 x 1.3 cm hypoattenuating nodule within the subdermal surface of the medial aspect the left upper chest (image 16, series 6, similar to the 10/19/2019 examination and while nonspecific again may represent a sebaceous cyst. _________________________________________________________ _________________________________________________________ CTA ABDOMEN AND PELVIS FINDINGS VASCULAR Aorta: Scattered large amount of irregular mixed calcified and noncalcified atherosclerotic plaque throughout the tortuous but normal caliber abdominal aorta, not resulting in hemodynamically significant stenosis. No abdominal aortic dissection or periaortic stranding. Celiac: There is a minimal amount of mixed calcified and noncalcified atherosclerotic plaque involving the origin of the celiac artery, not resulting in a hemodynamically significant  stenosis. SMA: There is a moderate amount of mixed calcified and noncalcified atherosclerotic plaque involving the main trunk of the SMA approaching 50% luminal narrowing at its mid/distal aspect (image 138, series 6). The distal tributaries the SMA appear patent without discrete intraluminal filling defect to suggest distal embolism. Renals: Solitary bilaterally; there is a minimal to moderate amount of eccentric calcified atherosclerotic plaque involving the origin of the left renal artery approaching 50% luminal narrowing (coronal image 73, series 9). There is a minimal amount of eccentric mixed calcified and noncalcified atherosclerotic plaque involving the origin of the right renal artery, not definitely resulting in hemodynamically significant narrowing. No vessel irregularity to suggest FMD. IMA: Disease at its origin though remains patent. Inflow: There is a minimal amount of eccentric mixed calcified and noncalcified atherosclerotic plaque involving the bilateral tortuous but normal caliber common iliac arteries, not resulting in hemodynamically significant stenosis. The bilateral internal iliac arteries are diseased but remain patent. The bilateral external iliac arteries are mildly diseased and tortuous though without a hemodynamically significant narrowing. There is a minimal amount of calcified atherosclerotic plaque involving the bilateral common femoral arteries, not resulting in hemodynamically significant stenosis. Eccentric noncalcified atherosclerotic plaque involves the origin of the right deep femoral artery approaching 50% luminal narrowing (image 249, series 6). The imaged portions of the left deep femoral and bilateral superficial femoral arteries appear patent without a definitive hemodynamically significant narrowing. Veins: The IVC and pelvic venous systems appear patent on this arterial phase examination. Review of the MIP images confirms the above findings.  _________________________________________________________ NON-VASCULAR Hepatobiliary: There is mild nodularity hepatic contour. No discrete hepatic lesions. Ill-defined layering gallstones are seen within the neck of the gallbladder (image 145, series 6). The gallbladder appears distended however there is no definitive gallbladder wall thickening or pericholecystic stranding. No intra or extrahepatic biliary duct dilatation. No ascites. Pancreas: Normal appearance of the pancreas. Spleen: Normal appearance of the spleen. Several punctate splenules are noted about the splenic hilum.  Adrenals/Urinary Tract: There is symmetric enhancement of the bilateral kidneys. Renal cysts are seen bilaterally with dominant exophytic cyst arising from the anterior superior pole the left kidney measuring 1.6 cm (image 126, series 6 and dominant cyst arising from the superior pole the right kidney measuring 3.2 cm (image 128, series 6). No definite evidence of nephrolithiasis on this postcontrast examination. There is a minimal amount of likely age and body habitus related bilateral perinephric stranding. No urine obstruction. Mild thickening of the left adrenal gland without discrete nodule. Normal appearance of the right adrenal gland. Normal appearance of the urinary bladder given degree of distention. Stomach/Bowel: Scattered colonic diverticulosis without evidence of superimposed acute diverticulitis. Normal appearance of the terminal ileum. Interval removal of periappendiceal drainage catheter with interval appendectomy though there remains an approximately 4.9 x 4.2 x 4.1 cm apparent fluid collection within right lower abdominal quadrant. No adjacent mesenteric stranding. Moderate colonic stool burden without evidence of enteric obstruction. No discrete areas of bowel wall thickening. No pneumoperitoneum, pneumatosis or portal venous gas. Lymphatic: There is an approximately 1.7 cm presumed lymph node within the root of the  right lower quadrant abdominal mesentery (image 173, series 6, previously, 1.2 cm. Additional scattered retroperitoneal lymph nodes are numerous though individually not enlarged by size criteria. No additional bulky retroperitoneal, mesenteric, pelvic or inguinal Reproductive: Normal appearance of the pelvic organs for age. No discrete adnexal lesion. Other: Note is made of a small mesenteric fat containing periumbilical hernia. There is a minimal amount of subcutaneous edema about the midline of the low back. Musculoskeletal: No acute or aggressive osseous abnormalities. Unchanged moderate (approximately 50%) compression deformity involving the superior endplate of the Q03 vertebral body, similar to abdominal CT performed 05/29/2019. Mild-to-moderate scoliotic curvature of the thoracolumbar spine associated multilevel moderate severe DDD, worse at L1-L2 and L4-L5. Review of the MIP images confirms the above findings. IMPRESSION: Chest CTA impression: 1. No acute cardiopulmonary disease. Specifically, no evidence of thoracic aortic aneurysm or dissection on this nongated examination. No evidence of central pulmonary embolism. 2. Cardiomegaly with small pericardial effusion, increased in size compared to the 10/19/2019 examination - further evaluation cardiac echo could be performed as clinically indicated. 3. Coronary artery calcifications. Aortic Atherosclerosis (ICD10-I70.0). 4. Enlargement right lower lobe sub pleural masslike opacity currently measuring 3.3 cm, previously 2.6 cm, when compared to abdominal CT 05/2019. As malignancy is not excluded on the basis of this examination, further evaluation with PET-CT could performed as clinically indicated. 5. Redemonstrated nonspecific prominent though non pathologically mediastinal and bilateral hilar lymph nodes, unchanged to slightly improved compared to the 10/19/2018 examination. Abdomen and pelvic CTA impression: 1. Large amount of mixed calcified and  noncalcified atherosclerotic plaque throughout tortuous but normal caliber abdominal aorta, not resulting in hemodynamically significant stenosis. 2. Post appendectomy and removal of percutaneous drainage catheter with residual/recurrent approximately 4.9 cm apparent fluid collection/phlegmon within the right lower abdominal quadrant and interval increase in size of presumably reactive lymph node within the root of the right lower quadrant abdominal mesentery, currently measuring 1.7 cm, previously 1.2 cm. No evidence of enteric obstruction. 3. Cholelithiasis with distension of the gallbladder but without gallbladder wall thickening or pericholecystic fluid. If there is clinical concern for acute cholecystitis, further evaluation with right upper quadrant abdominal ultrasound could be performed as clinically indicated. 4. Nodularity hepatic contour as could be seen in the setting of early cirrhotic change. Correlation with LFTs is advised. 5. Unchanged moderate (approximately 25%) compression deformity involving the T12 vertebral body. 6.  Mild-to-moderate scoliotic curvature of the thoracolumbar spine associated multilevel moderate severe DDD, worse at L1-L2 and L4-L5. Electronically Signed   By: Sandi Mariscal M.D.   On: 02/01/2020 10:24    EKG: Independently reviewed.  A flutter at 120.  Frequent PVCs of differing morphologies.  Right bundle Lloyd pattern with right axis deviation at 260.  Poor R wave progression throughout.  Q in 3, V1 and V3.   Assessment/Plan:   Principal Problem:   Pericarditis Active Problems:   Atrial fibrillation (HCC)   Hypertension   CHF (congestive heart failure) ([place])  Spry 86 year old female developed acute back pain and new pleuritic anterior chest pain earlier today.  Work-up is notable for no PE and no aortic dissection.  She does have a small pericardial effusion which is increased in size from prior.  She also has a possible 3 cm subpleural nodule which has grown over  the past 8 months.    Pleuritic anterior chest pain improved with sitting up and worsened with lying flat. The cause of new pleuritic chest pain is unclear, positional nature does sound like a pericardial type pain However she does have some subpleural masses, all they are on the right, which may or may not be contributory. Patient has a somewhat enlarged pleural effusion although it is still small, no evidence of tamponade Lots of PVCs seen on telemetry and EKG She has been feeling somewhat dizzy lately, felt flushed yesterday but is afebrile with no leukocytosis Echocardiogram ordered Can consider cardiology consultation depending on results of echocardiogram As needed Toradol ordered  Back pain Per my exam, back pain seems musculoskeletal and may well be secondary to pushing and pulling of food processor and/or trying to make a bed. As needed fentanyl, hydrocodone and Toradol are ordered. She does have a T12 compression fracture that is without change  Atrial flutter with multifocal PVCs Rate is controlled, will restart medications once medications have been reconciled And consult placed  HTN Restart meds once meds have been reconciled  HFrEF with significant lower extremity edema. Patient has significant lower extremity edema, she might well benefit from some diuresis while in house depending upon results of echocardiogram  Abdominal fluid collection/phlegmon In the right lower quadrant where she had her appendicitis which revealed appendiceal cancer Abdomen is entirely benign on exam and patient has no abdominal pain Further work-up as warranted as an outpatient   Other information:   DVT prophylaxis: Coumadin ordered. Code Status: DNR Family Communication: Will contact patient's daughter Disposition Plan: Home Consults called: None Admission status: Observation  Ashley Dunn Tublu Garvis Downum [facility]  If 7PM-7AM, please contact  night-coverage [email or web address] Password [email or web address] 02/01/2020, 12:02 PM

## 2020-02-01 NOTE — ED Notes (Signed)
Pt up aeting lunch tray. No other needs from pt.

## 2020-02-01 NOTE — ED Triage Notes (Signed)
Pt reports that she began having CP that radiates to her back, started at 2am, denies n/v/SOB

## 2020-02-01 NOTE — ED Provider Notes (Signed)
[facility] EMERGENCY DEPARTMENT Provider Note   CSN: [id number] Arrival date & time: 02/01/20  0556     History Chief Complaint  Patient presents with  . Chest Pain    Annebelle Bostic is a 86 y.o. female.  HPI      86yo female with history of atrial fibrillation on coumadin, CHF, hypertension, cancer of appendix, hypothyroidism presents with back pain and chest pain.  2AM woke up to go to the bathroom and suddenly developed back pain/shoulder and chest pain, worse laying down and taking deep breaths, improved sitting up Severe pain Tried to take tylenol which didn't help much but improved now Pain sharp with deep breaths, mild pain at rest at this time Mild cough chronic  She is not vaccinated for covid, reports she has a strong immune system  Past Medical History:  Diagnosis Date  . Arthritis   . Atrial fibrillation ([place])   . Cataract   . CHF (congestive heart failure) ([place])   . Hypertension   . Osteoporosis   . Thyroid disease     Patient Active Problem List   Diagnosis Date Noted  . Pericarditis 02/01/2020  . Acute on chronic heart failure with preserved ejection fraction ([place])   . Acute on chronic systolic (congestive) heart failure ([place]) 10/20/2019  . Cancer of appendix ([place]) 10/19/2019  . Acute on chronic systolic CHF (congestive heart failure) ([place]) 10/19/2019  . Monitoring for long-term anticoagulant use 10/15/2019  . Abscess of abdominal cavity ([place]) 08/05/2019  . Chronic kidney disease (CKD), stage III (moderate) ([place]) 08/05/2019  . Pre-op evaluation 06/17/2019  . Hypertension   . Osteoporosis   . Thyroid disease   . CHF (congestive heart failure) ([place])   . Arthritis   . Appendicitis with abscess 05/29/2019  . Atrial fibrillation ([place]) 03/19/2019  . Permanent atrial fibrillation ([place]) 02/26/2019    Past Surgical History:  Procedure Laterality Date  . CESAREAN SECTION    . EYE SURGERY    . FRACTURE SURGERY    . IR CATHETER  TUBE CHANGE  06/12/2019  . IR CATHETER TUBE CHANGE  08/05/2019  . IR RADIOLOGIST EVAL & MGMT  06/11/2019  . IR RADIOLOGIST EVAL & MGMT  06/25/2019  . IR RADIOLOGIST EVAL & MGMT  07/30/2019  . LAPAROSCOPIC APPENDECTOMY N/A 08/07/2019   Procedure: LAPAROSCOPIC  APPENDECTOMY;  Surgeon: Ralene Ok, MD;  Location: [facility];  Service: General;  Laterality: N/A;  . LAPAROSCOPY N/A 08/07/2019   Procedure: Laparoscopy Diagnostic and Lysis of Adhesions;  Surgeon: Ralene Ok, MD;  Location: [facility];  Service: General;  Laterality: N/A;     OB History   No obstetric history on file.     Family History  Problem Relation Age of Onset  . Stroke Mother   . Cancer Father   . Hyperlipidemia Brother   . Healthy Daughter   . Healthy Son   . Heart disease Maternal Grandmother   . Heart disease Maternal Grandfather     Social History   Tobacco Use  . Smoking status: Never Smoker  . Smokeless tobacco: Never Used  Substance Use Topics  . Alcohol use: Never  . Drug use: Never    Home Medications Prior to Admission medications   Medication Sig Start Date End Date Taking? Authorizing Provider  acetaminophen (TYLENOL) 500 MG tablet Take 2 tablets (1,000 mg total) by mouth every 6 (six) hours as needed for mild pain or fever. 08/09/19   Saverio Danker, PA-C  Ascorbic Acid (  VITAMIN C) 100 MG tablet Take 100 mg by mouth daily.    [provider]  Calcium 150 MG TABS Take 150 mg by mouth daily.    [provider]  Cholecalciferol (VITAMIN D3) 10 MCG (400 UNIT) CAPS Take 400 Units by mouth daily.    [provider]  furosemide (LASIX) 40 MG tablet Take 1 tablet (40 mg total) by mouth daily. 11/07/19   Patwardhan, Reynold Bowen, MD  Garlic 628 MG TABS Take 100 mg by mouth daily.    [provider]  lactobacillus acidophilus (BACID) TABS tablet Take 1 tablet by mouth daily with breakfast. 1 hr before breakfast and 2 hours after meds    [provider]  LINOLEIC  ACID-SUNFLOWER OIL PO Take 1 capsule by mouth daily.    [provider]  metoprolol succinate (TOPROL-XL) 50 MG 24 hr tablet Take 1 tablet (50 mg total) by mouth daily. Take with or immediately following a meal. 10/23/19   Hosie Poisson, MD  Omega-3 Fatty Acids (FISH OIL) 1000 MG CAPS Take 1,000 mg by mouth daily.    [provider]  thiamine (VITAMIN B-1) 100 MG tablet Take 100 mg by mouth daily.    [provider]  thyroid (ARMOUR THYROID) 120 MG tablet Take 1 tablet (120 mg total) by mouth daily before breakfast. 08/02/19   Forrest Moron, MD  Turmeric (QC TUMERIC COMPLEX) 500 MG CAPS Take 500 mg by mouth daily.    [provider]  vitamin E 1000 UNIT capsule Take 1,000 Units by mouth daily.    [provider]  warfarin (COUMADIN) 5 MG tablet Take 1 tablet (5 mg total) by mouth daily. Refer to most recent anticoagulation note for most accurate dosing instructions. 01/16/20   Patwardhan, Reynold Bowen, MD    Allergies    Patient has no known allergies.  Review of Systems   Review of Systems  Constitutional: Negative for fever.  HENT: Negative for sore throat.   Eyes: Negative for visual disturbance.  Respiratory: Positive for cough and shortness of breath.   Cardiovascular: Positive for chest pain.  Gastrointestinal: Negative for abdominal pain, nausea and vomiting.  Genitourinary: Negative for difficulty urinating.  Musculoskeletal: Positive for back pain. Negative for neck pain.  Skin: Negative for rash.  Neurological: Negative for syncope and headaches.    Physical Exam Updated Vital Signs BP 130/63 (BP Location: Right Arm)   Pulse 89   Temp 97.8 F (36.6 C) (Oral)   Resp 18   Ht 5' (1.524 m)   Wt 70 kg   SpO2 92%   BMI 30.14 kg/m   Physical Exam Vitals and nursing note reviewed.  Constitutional:      General: She is not in acute distress.    Appearance: She is well-developed. She is not diaphoretic.  HENT:     Head:  Normocephalic and atraumatic.  Eyes:     Conjunctiva/sclera: Conjunctivae normal.  Cardiovascular:     Rate and Rhythm: Normal rate and regular rhythm.     Heart sounds: Normal heart sounds. No murmur heard.  No friction rub. No gallop.   Pulmonary:     Effort: Pulmonary effort is normal. No respiratory distress.     Breath sounds: Normal breath sounds. No wheezing or rales.  Abdominal:     General: There is no distension.     Palpations: Abdomen is soft.     Tenderness: There is no abdominal tenderness. There is no guarding.  Musculoskeletal:  General: No tenderness.     Cervical back: Normal range of motion.  Skin:    General: Skin is warm and dry.     Findings: No erythema or rash.  Neurological:     Mental Status: She is alert and oriented to person, place, and time.     ED Results / Procedures / Treatments   Labs (all labs ordered are listed, but only abnormal results are displayed) Labs Reviewed  BASIC METABOLIC PANEL - Abnormal; Notable for the following components:      Result Value   Glucose, Bld 134 (*)    BUN 32 (*)    Creatinine, Ser 1.09 (*)    GFR, Estimated 44 (*)    All other components within normal limits  CBC - Abnormal; Notable for the following components:   Hemoglobin 15.1 (*)    HCT 47.4 (*)    All other components within normal limits  PROTIME-INR - Abnormal; Notable for the following components:   Prothrombin Time 16.8 (*)    INR 1.4 (*)    All other components within normal limits  TROPONIN I (HIGH SENSITIVITY) - Abnormal; Notable for the following components:   Troponin I (High Sensitivity) 18 (*)    All other components within normal limits  RESPIRATORY PANEL BY RT PCR (FLU A&B, COVID)  TSH  TROPONIN I (HIGH SENSITIVITY)    EKG EKG Interpretation  Date/Time:  Saturday February 01 2020 05:58:41 EDT Ventricular Rate:  118 PR Interval:    QRS Duration: 76 QT Interval:  320 QTC Calculation: 448 R Axis:   -101 Text  Interpretation: Atrial flutter with variable A-V block with premature ventricular or aberrantly conducted complexes Right superior axis deviation Anterior infarct , age undetermined Abnormal ECG Since prior ECG, rate has increased Confirmed by Gareth Morgan [phone]) on 02/01/2020 8:43:58 AM   Radiology DG Chest 2 View  Result Date: 02/01/2020 CLINICAL DATA:  Chest pain EXAM: CHEST - 2 VIEW COMPARISON:  October 19, 2019 FINDINGS: Mediastinal contour is normal. The aorta is tortuous. Heart size is enlarged. There is no focal infiltrate, pulmonary edema, or pleural effusion. Degenerative joint changes of the spine with scoliosis are noted. IMPRESSION: No active cardiopulmonary disease.  Cardiomegaly unchanged. Electronically Signed   By: Abelardo Diesel M.D.   On: 02/01/2020 08:55   CT Angio Chest/Abd/Pel for Dissection W and/or Wo Contrast  Result Date: 02/01/2020 CLINICAL DATA:  Chest/back pain.  Evaluate for dissection. EXAM: CT ANGIOGRAPHY CHEST, ABDOMEN AND PELVIS TECHNIQUE: Non-contrast CT of the chest was initially obtained. Multidetector CT imaging through the chest, abdomen and pelvis was performed using the standard protocol during bolus administration of intravenous contrast. Multiplanar reconstructed images and MIPs were obtained and reviewed to evaluate the vascular anatomy. CONTRAST:  175mL OMNIPAQUE IOHEXOL 350 MG/ML SOLN COMPARISON:  Chest CT-10/19/2019; CT abdomen and pelvis-08/05/2019; 05/29/2019 FINDINGS: CTA CHEST FINDINGS Vascular Findings: Review of the precontrast images is negative for the presence of an intramural hematoma. No evidence of thoracic aortic aneurysm or dissection on this nongated examination. There is a large amount of eccentric predominantly calcified atherosclerotic plaque throughout the thoracic aorta, including slightly irregular noncalcified plaque/mural thrombus involving the posterior aspect of the aortic arch (image 26, series 6), not resulting in a hemodynamically  significant stenosis. Cardiomegaly. Small pericardial effusion, increased in size compared to the 10/19/2019 examination. Coronary artery calcifications. Although this examination was not tailored for the evaluation the pulmonary arteries, there are no discrete filling defects within the central pulmonary arterial tree  to suggest central pulmonary embolism. Borderline enlarged caliber of the main pulmonary artery measuring 32 mm in diameter. ------------------------------------------------------------- Thoracic aortic measurements: Sinotubular junction 29 mm as measured in greatest oblique short axis coronal dimension. Proximal ascending aorta 32 mm as measured in greatest oblique short axis axial dimension at the level of the main pulmonary artery and 32 mm in greatest oblique short axis coronal diameter (coronal image 59, series 9) Aortic arch aorta 29 mm as measured in greatest oblique short axis sagittal dimension. Proximal descending thoracic aorta 25 mm as measured in greatest oblique short axis axial dimension at the level of the main pulmonary artery. Distal descending thoracic aorta 23 mm as measured in greatest oblique short axis axial dimension at the level of the diaphragmatic hiatus. Review of the MIP images confirms the above findings. ------------------------------------------------------------- Non-Vascular Findings: Mediastinum/Lymph Nodes: Scattered mediastinal hilar lymph nodes are unchanged to slightly reduced in size in the interval with index AP window lymph node now measuring 0.9 cm in greatest short axis diameter (image 34, series 6, previously, 1.3 cm. Bilateral hilar lymph nodes are grossly unchanged with index right suprahilar and index left infrahilar lymph nodes both measuring approximately 1 cm greatest short axis diameter (right-image 51, series 6; left-image 56, series 6). Lungs/Pleura: Mild apical predominant centrilobular emphysematous change. Minimal dependent subpleural  ground-glass atelectasis. Subpleural nodular airspace opacity involving the subpleural aspect of the right lower lobe has increased in size, currently measuring 3.3 x 1.2 cm (image 91, series 7, previously, 2.6 x 1.1 cm when compared to abdominal CT performed 05/29/2019. Musculoskeletal: No acute or aggressive osseous abnormalities. Stigmata of dish within the thoracic spine. The thyroid appears atrophic. Note is made of an approximately 1.8 x 1.3 cm hypoattenuating nodule within the subdermal surface of the medial aspect the left upper chest (image 16, series 6, similar to the 10/19/2019 examination and while nonspecific again may represent a sebaceous cyst. _________________________________________________________ _________________________________________________________ CTA ABDOMEN AND PELVIS FINDINGS VASCULAR Aorta: Scattered large amount of irregular mixed calcified and noncalcified atherosclerotic plaque throughout the tortuous but normal caliber abdominal aorta, not resulting in hemodynamically significant stenosis. No abdominal aortic dissection or periaortic stranding. Celiac: There is a minimal amount of mixed calcified and noncalcified atherosclerotic plaque involving the origin of the celiac artery, not resulting in a hemodynamically significant stenosis. SMA: There is a moderate amount of mixed calcified and noncalcified atherosclerotic plaque involving the main trunk of the SMA approaching 50% luminal narrowing at its mid/distal aspect (image 138, series 6). The distal tributaries the SMA appear patent without discrete intraluminal filling defect to suggest distal embolism. Renals: Solitary bilaterally; there is a minimal to moderate amount of eccentric calcified atherosclerotic plaque involving the origin of the left renal artery approaching 50% luminal narrowing (coronal image 73, series 9). There is a minimal amount of eccentric mixed calcified and noncalcified atherosclerotic plaque involving the  origin of the right renal artery, not definitely resulting in hemodynamically significant narrowing. No vessel irregularity to suggest FMD. IMA: Disease at its origin though remains patent. Inflow: There is a minimal amount of eccentric mixed calcified and noncalcified atherosclerotic plaque involving the bilateral tortuous but normal caliber common iliac arteries, not resulting in hemodynamically significant stenosis. The bilateral internal iliac arteries are diseased but remain patent. The bilateral external iliac arteries are mildly diseased and tortuous though without a hemodynamically significant narrowing. There is a minimal amount of calcified atherosclerotic plaque involving the bilateral common femoral arteries, not resulting in hemodynamically significant stenosis. Eccentric noncalcified atherosclerotic plaque  involves the origin of the right deep femoral artery approaching 50% luminal narrowing (image 249, series 6). The imaged portions of the left deep femoral and bilateral superficial femoral arteries appear patent without a definitive hemodynamically significant narrowing. Veins: The IVC and pelvic venous systems appear patent on this arterial phase examination. Review of the MIP images confirms the above findings. _________________________________________________________ NON-VASCULAR Hepatobiliary: There is mild nodularity hepatic contour. No discrete hepatic lesions. Ill-defined layering gallstones are seen within the neck of the gallbladder (image 145, series 6). The gallbladder appears distended however there is no definitive gallbladder wall thickening or pericholecystic stranding. No intra or extrahepatic biliary duct dilatation. No ascites. Pancreas: Normal appearance of the pancreas. Spleen: Normal appearance of the spleen. Several punctate splenules are noted about the splenic hilum. Adrenals/Urinary Tract: There is symmetric enhancement of the bilateral kidneys. Renal cysts are seen  bilaterally with dominant exophytic cyst arising from the anterior superior pole the left kidney measuring 1.6 cm (image 126, series 6 and dominant cyst arising from the superior pole the right kidney measuring 3.2 cm (image 128, series 6). No definite evidence of nephrolithiasis on this postcontrast examination. There is a minimal amount of likely age and body habitus related bilateral perinephric stranding. No urine obstruction. Mild thickening of the left adrenal gland without discrete nodule. Normal appearance of the right adrenal gland. Normal appearance of the urinary bladder given degree of distention. Stomach/Bowel: Scattered colonic diverticulosis without evidence of superimposed acute diverticulitis. Normal appearance of the terminal ileum. Interval removal of periappendiceal drainage catheter with interval appendectomy though there remains an approximately 4.9 x 4.2 x 4.1 cm apparent fluid collection within right lower abdominal quadrant. No adjacent mesenteric stranding. Moderate colonic stool burden without evidence of enteric obstruction. No discrete areas of bowel wall thickening. No pneumoperitoneum, pneumatosis or portal venous gas. Lymphatic: There is an approximately 1.7 cm presumed lymph node within the root of the right lower quadrant abdominal mesentery (image 173, series 6, previously, 1.2 cm. Additional scattered retroperitoneal lymph nodes are numerous though individually not enlarged by size criteria. No additional bulky retroperitoneal, mesenteric, pelvic or inguinal Reproductive: Normal appearance of the pelvic organs for age. No discrete adnexal lesion. Other: Note is made of a small mesenteric fat containing periumbilical hernia. There is a minimal amount of subcutaneous edema about the midline of the low back. Musculoskeletal: No acute or aggressive osseous abnormalities. Unchanged moderate (approximately 50%) compression deformity involving the superior endplate of the S06 vertebral  body, similar to abdominal CT performed 05/29/2019. Mild-to-moderate scoliotic curvature of the thoracolumbar spine associated multilevel moderate severe DDD, worse at L1-L2 and L4-L5. Review of the MIP images confirms the above findings. IMPRESSION: Chest CTA impression: 1. No acute cardiopulmonary disease. Specifically, no evidence of thoracic aortic aneurysm or dissection on this nongated examination. No evidence of central pulmonary embolism. 2. Cardiomegaly with small pericardial effusion, increased in size compared to the 10/19/2019 examination - further evaluation cardiac echo could be performed as clinically indicated. 3. Coronary artery calcifications. Aortic Atherosclerosis (ICD10-I70.0). 4. Enlargement right lower lobe sub pleural masslike opacity currently measuring 3.3 cm, previously 2.6 cm, when compared to abdominal CT 05/2019. As malignancy is not excluded on the basis of this examination, further evaluation with PET-CT could performed as clinically indicated. 5. Redemonstrated nonspecific prominent though non pathologically mediastinal and bilateral hilar lymph nodes, unchanged to slightly improved compared to the 10/19/2018 examination. Abdomen and pelvic CTA impression: 1. Large amount of mixed calcified and noncalcified atherosclerotic plaque throughout tortuous but normal  caliber abdominal aorta, not resulting in hemodynamically significant stenosis. 2. Post appendectomy and removal of percutaneous drainage catheter with residual/recurrent approximately 4.9 cm apparent fluid collection/phlegmon within the right lower abdominal quadrant and interval increase in size of presumably reactive lymph node within the root of the right lower quadrant abdominal mesentery, currently measuring 1.7 cm, previously 1.2 cm. No evidence of enteric obstruction. 3. Cholelithiasis with distension of the gallbladder but without gallbladder wall thickening or pericholecystic fluid. If there is clinical concern for  acute cholecystitis, further evaluation with right upper quadrant abdominal ultrasound could be performed as clinically indicated. 4. Nodularity hepatic contour as could be seen in the setting of early cirrhotic change. Correlation with LFTs is advised. 5. Unchanged moderate (approximately 25%) compression deformity involving the T12 vertebral body. 6. Mild-to-moderate scoliotic curvature of the thoracolumbar spine associated multilevel moderate severe DDD, worse at L1-L2 and L4-L5. Electronically Signed   By: Sandi Mariscal M.D.   On: 02/01/2020 10:24    Procedures Procedures (including critical care time)  Medications Ordered in ED Medications  sodium chloride flush (NS) 0.9 % injection 3 mL (3 mLs Intravenous Given 02/01/20 1301)  sodium chloride flush (NS) 0.9 % injection 3 mL (has no administration in time range)  0.9 %  sodium chloride infusion (has no administration in time range)  traMADol (ULTRAM) tablet 50 mg (has no administration in time range)  HYDROcodone-acetaminophen (NORCO/VICODIN) 5-325 MG per tablet 1-2 tablet (has no administration in time range)  ketorolac (TORADOL) 15 MG/ML injection 15 mg (has no administration in time range)  Warfarin - Pharmacist Dosing Inpatient (has no administration in time range)  warfarin (COUMADIN) tablet 7.5 mg (has no administration in time range)  fentaNYL (SUBLIMAZE) injection 50 mcg (50 mcg Intravenous Given 02/01/20 0947)  iohexol (OMNIPAQUE) 350 MG/ML injection 100 mL (100 mLs Intravenous Contrast Given 02/01/20 0927)  methylPREDNISolone sodium succinate (SOLU-MEDROL) 125 mg/2 mL injection 125 mg (125 mg Intravenous Given 02/01/20 1216)  ketorolac (TORADOL) 15 MG/ML injection 15 mg (15 mg Intravenous Given 02/01/20 1215)    ED Course  I have reviewed the triage vital signs and the nursing notes.  Pertinent labs & imaging results that were available during my care of the patient were reviewed by me and considered in my medical decision making  (see chart for details).    MDM Rules/Calculators/A&P                          86yo female with history of atrial fibrillation on coumadin, CHF, hypertension, cancer of appendix, hypothyroidism presents with severe back/shoulder pain and chest pain. Differential diagnosis for chest pain includes pulmonary embolus, dissection, pneumothorax, pneumonia, ACS, myocarditis, pericarditis.  EKG was done and evaluate by me and showed no acute ST changes and no signs of pericarditis-did show atrial fibrillation/flutter.. Chest x-ray was done and evaluated by me and radiology and showed sign of pneumonia or pneumothorax.  Given severe pain radiating to the back, obtain CT dissection study which showed no evidence of dissection, however did show pericardial effusion.  Also discussed with patient findings of gallstones and fluid in right lower quadrant.  She does not have any abdominal tenderness or pain, and do not feel that the pain she is experiencing is referred pain from the abdomen, and have low suspicion for abscess or cholecystitis.  Her initial troponin is mildly elevated at 18, repeat is 17.  Have low suspicion for ACS by history and troponin decreased.  Suspect most  likely etiology of her symptoms is pericarditis given presence of pericardial effusion, sharp, pleuritic and positional chest pain.  Her pain is severe and blood pressures decreased with fentanyl in ED.  Will admit for pain control, obtain echo and continued monitoring.  WIll initiate steroids/colchicine.      Final Clinical Impression(s) / ED Diagnoses Final diagnoses:  Acute pericarditis, unspecified type  Chest pain, unspecified type    Rx / DC Orders ED Discharge Orders    None       Gareth Morgan, MD 02/01/20 1330

## 2020-02-01 NOTE — Progress Notes (Signed)
  Echocardiogram 2D Echocardiogram has been performed.  Ashley Dunn 02/01/2020, 1:53 PM

## 2020-02-02 DIAGNOSIS — C801 Malignant (primary) neoplasm, unspecified: Secondary | ICD-10-CM

## 2020-02-02 DIAGNOSIS — Z7901 Long term (current) use of anticoagulants: Secondary | ICD-10-CM

## 2020-02-02 DIAGNOSIS — I300 Acute nonspecific idiopathic pericarditis: Secondary | ICD-10-CM | POA: Diagnosis not present

## 2020-02-02 DIAGNOSIS — I309 Acute pericarditis, unspecified: Secondary | ICD-10-CM | POA: Diagnosis not present

## 2020-02-02 DIAGNOSIS — I4811 Longstanding persistent atrial fibrillation: Secondary | ICD-10-CM | POA: Diagnosis not present

## 2020-02-02 DIAGNOSIS — I5022 Chronic systolic (congestive) heart failure: Secondary | ICD-10-CM

## 2020-02-02 LAB — URINALYSIS, ROUTINE W REFLEX MICROSCOPIC
Bilirubin Urine: NEGATIVE
Glucose, UA: NEGATIVE mg/dL
Hgb urine dipstick: NEGATIVE
Ketones, ur: NEGATIVE mg/dL
Leukocytes,Ua: NEGATIVE
Nitrite: NEGATIVE
Protein, ur: NEGATIVE mg/dL
Specific Gravity, Urine: 1.03 (ref 1.005–1.030)
pH: 5 (ref 5.0–8.0)

## 2020-02-02 LAB — COMPREHENSIVE METABOLIC PANEL
ALT: 24 U/L (ref 0–44)
AST: 24 U/L (ref 15–41)
Albumin: 2.7 g/dL — ABNORMAL LOW (ref 3.5–5.0)
Alkaline Phosphatase: 57 U/L (ref 38–126)
Anion gap: 11 (ref 5–15)
BUN: 43 mg/dL — ABNORMAL HIGH (ref 8–23)
CO2: 28 mmol/L (ref 22–32)
Calcium: 8.8 mg/dL — ABNORMAL LOW (ref 8.9–10.3)
Chloride: 99 mmol/L (ref 98–111)
Creatinine, Ser: 1.4 mg/dL — ABNORMAL HIGH (ref 0.44–1.00)
GFR, Estimated: 33 mL/min — ABNORMAL LOW (ref >60)
Glucose, Bld: 151 mg/dL — ABNORMAL HIGH (ref 70–99)
Potassium: 4 mmol/L (ref 3.5–5.1)
Sodium: 138 mmol/L (ref 135–145)
Total Bilirubin: 1.1 mg/dL (ref 0.3–1.2)
Total Protein: 6.7 g/dL (ref 6.5–8.1)

## 2020-02-02 LAB — SEDIMENTATION RATE: Sed Rate: 52 mm/hr — ABNORMAL HIGH (ref 0–22)

## 2020-02-02 LAB — C-REACTIVE PROTEIN: CRP: 22.5 mg/dL — ABNORMAL HIGH (ref <1.0)

## 2020-02-02 LAB — PROTIME-INR
INR: 1.6 — ABNORMAL HIGH (ref 0.8–1.2)
Prothrombin Time: 18.9 seconds — ABNORMAL HIGH (ref 11.4–15.2)

## 2020-02-02 MED ORDER — VITAMIN B-1 100 MG PO TABS
100.0000 mg | ORAL_TABLET | Freq: Every day | ORAL | Status: DC
  Filled 2020-02-02 (×2): qty 1

## 2020-02-02 MED ORDER — COLCHICINE 0.6 MG PO TABS: 0.3000 mg | ORAL_TABLET | Freq: Two times a day (BID) | ORAL | 1 refills | Status: DC

## 2020-02-02 MED ORDER — BACID PO TABS: 1.0000 | ORAL_TABLET | Freq: Every day | ORAL | Status: DC

## 2020-02-02 MED ORDER — METOPROLOL SUCCINATE ER 50 MG PO TB24: 50.0000 mg | ORAL_TABLET | Freq: Every day | ORAL | Status: DC

## 2020-02-02 MED ORDER — THYROID 120 MG PO TABS
120.0000 mg | ORAL_TABLET | Freq: Every day | ORAL | Status: DC
  Administered 2020-02-02: 120 mg via ORAL
  Filled 2020-02-02: qty 1

## 2020-02-02 MED ORDER — WARFARIN SODIUM 7.5 MG PO TABS: 7.5000 mg | ORAL_TABLET | Freq: Once | ORAL | Status: DC

## 2020-02-02 MED ORDER — FUROSEMIDE 40 MG PO TABS
40.0000 mg | ORAL_TABLET | Freq: Every day | ORAL | 3 refills | Status: DC
Start: 2020-02-02 — End: 2020-02-04

## 2020-02-02 MED ORDER — THYROID 120 MG PO TABS: 120.0000 mg | ORAL_TABLET | Freq: Every day | ORAL | Status: DC

## 2020-02-02 MED ORDER — METOPROLOL SUCCINATE ER 50 MG PO TB24
50.0000 mg | ORAL_TABLET | Freq: Every day | ORAL | Status: DC
  Administered 2020-02-02: 50 mg via ORAL
  Filled 2020-02-02: qty 1

## 2020-02-02 NOTE — Discharge Summary (Signed)
Discharge Summary  Nimisha Rathel MVE:[id number] DOB: 29-Feb-1926  PCP: [name], MD  Admit date: 02/01/2020 Discharge date: 02/02/2020  Time spent: 72mns, more than 50% time spent on coordination of care.  Recommendations for Outpatient Follow-up:  1. F/u with PCP within a week  for hospital discharge follow up, repeat bmp at follow up, monitor colchicine dose adjustment. 2. F/u with cardiology Dr PVirgina Jock Dr patwardhan to decide on colchicine taper and duration  Discharge Diagnoses:  Active Hospital Problems   Diagnosis Date Noted  . Pericarditis 02/01/2020  . Anticoagulated on Coumadin   . Cancer ([facility]   . CHF (congestive heart failure) ([facility]   . Hypertension   . Atrial fibrillation ([facility] 03/19/2019    Resolved Hospital Problems  No resolved problems to display.    Discharge Condition: stable  Diet recommendation: heart healthy  Filed Weights   02/01/20 0905  Weight: 70 kg    History of present illness: (per admitting MD Dr CJamse Arn ARalene Batheis an 86y.o. female with PMH significant for HTN, A. fib on Coumadin, HFrEF, osteoporosis and recent diagnosis of appendiceal carcinoma s/p appendectomy, now being followed with watchful waiting given advanced age who was in her usual state of good health living at home independently until last night when she developed back pain on her way back from the bathroom.  Patient states that she has chronic back pain "because I have osteoporosis you know but I try to do my exercises every morning" but last night on her way back from the bathroom she noted her back pain had worsened significantly. She tried to self treat with Tylenol without good effect. She noted that when she sat up she felt better. She subsequently noted pain in her anterior chest which she had never felt before. Patient describes pain as sharp and stabbing but occurs only when she takes a deep breath. Patient has not had similar chest pain in the past. Patient  states that the chest pain and back pain continue to worsen and so called the ambulance.  Patient denies recent fevers or chills but does admit to feeling "briefly flushed" yesterday without recurrence.  She generally is very active, does note that she may have pulled her back when she made her daughter's bed last week and then again yesterday when she was manipulating the relatively heavy and stuck food processor to make salmon cakes.  She did not have acute pain after either of these events but did feel that she may have "pulled something".  Patient does admit to feeling intermittently dizzy over the past couple of days however.  No syncope or presyncope.  Just episodes of feeling slightly dizzy which passed spontaneously.   ED Course:  The patient was noted to be afebrile with essentially normal vital signs.  Patient underwent imaging to evaluate for PE and or aortic dissection.  CT angiogram of chest abdomen pelvis was negative for any evidence of PE or aortic dissection.  She was noted to have a moderate compression fracture around T12 which was unchanged, along with her baseline lumbar DDD.  Patient was also noted to have some hilar and mediastinal adenopathy as well as a 3.3 cm right lower lobe "subpleural masslike opacity" which was increased in size from 2.6 cm in February 2021.  She was also noted to have cardiomegaly and small Pericardial effusion which had increased in size from prior.  Patient was also noted to have a 5 cm fluid collection/phlegmon in the right lower quadrant which  has increased in size to 1.  7 cm, up from 1.2 cm.  It was treated with fentanyl, Solu-Medrol and Toradol.   Hospital Course:  Principal Problem:   Pericarditis Active Problems:   Atrial fibrillation (HCC)   Hypertension   CHF (congestive heart failure) (HCC)   Anticoagulated on Coumadin   Cancer [facility])  Spry 86 year old female developed acute back pain and new pleuritic anterior chest pain earlier today.   Work-up is notable for no PE and no aortic dissection.  She does have a small pericardial effusion which is increased in size from prior.  She also has a possible 3 cm subpleural nodule which has grown over the past 8 months.    Pleuritic anterior chest pain improved with sitting up and worsened with lying flat. -EKG no significant st changes -echo did not comment on pericardial effusion -concerning for pericarditis with elevated ESR and CRP -she received one dose solumedrol and toradol, pain has resolved -case discussed with cardiology Dr Earnie Larsson who recommended to start colchicine and follow up with him in the office  Abdominal fluid collection, h/o Poorly differentiated invasive adenocarcinoma diagnosed in April 2021 Case discussed with IR no intervention due to no symptom , this is likely due to underline cancer  patient does not desire intervention either She is to follow up with pcp should she develops symptom  Atrial flutter with multifocal PVCs Rate is controlled, Continue metoprolol and Coumadin  Chronic HFrEF  Left ventricular EF 50 to 55% Hold Lasix on Monday and Tuesday due to elevation of creatinine, she does not appear significantly volume overloded Repeat BMP on Tuesday, follow-up PCP instruction regarding Lasix resumption  AKI on CKDIIIa vs fluctuation CKDIII a and b -BUN 46 creatinine 1.03 in June 2021, BUN 32 creatinine 1.09 on October 9 -Today BUN 43 creatinine 1.4, UA does appear concentrated otherwise unremarkable -She did receive Toradol yesterday -We will hold Lasix Monday and Tuesday, avoid NSAIDs, repeat BMP on Tuesday by PCP  HTN Continue Toprol    Procedures:  none  Consultations:  Phone conversation with IR  Phone conversation with cardiology  Discharge Exam: BP 117/88   Pulse 83   Temp 98.3 F (36.8 C) (Oral)   Resp 19   Ht 5' (1.524 m)   Wt 70 kg   SpO2 98%   BMI 30.14 kg/m   General: NAD Cardiovascular: IRRR Respiratory:  CTABL  Discharge Instructions You were cared for by a hospitalist during your hospital stay. If you have any questions about your discharge medications or the care you received while you were in the hospital after you are discharged, you can call the unit and asked to speak with the hospitalist on call if the hospitalist that took care of you is not available. Once you are discharged, your primary care physician will handle any further medical issues. Please note that NO REFILLS for any discharge medications will be authorized once you are discharged, as it is imperative that you return to your primary care physician (or establish a relationship with a primary care physician if you do not have one) for your aftercare needs so that they can reassess your need for medications and monitor your lab values.  Discharge Instructions    Diet - low sodium heart healthy   Complete by: As directed    Increase activity slowly   Complete by: As directed      Allergies as of 02/02/2020   No Known Allergies     Medication List  TAKE these medications   acetaminophen 500 MG tablet Commonly known as: TYLENOL Take 2 tablets (1,000 mg total) by mouth every 6 (six) hours as needed for mild pain or fever.   Calcium 150 MG Tabs Take 150 mg by mouth daily. Notes to patient: As Before, As Directed   CALCIUM PO Take 1 tablet by mouth in the morning, at noon, in the evening, and at bedtime.   colchicine 0.6 MG tablet Take 0.5 tablets (0.3 mg total) by mouth 2 (two) times daily.   Fish Oil 1000 MG Caps Take 1,000 mg by mouth daily.   furosemide 40 MG tablet Commonly known as: LASIX Take 1 tablet (40 mg total) by mouth daily. Please hold lasix on Monday and Tuesday What changed: additional instructions   Garlic 284 MG Tabs Take 100 mg by mouth daily.   lactobacillus acidophilus Tabs tablet Take 1 tablet by mouth daily with breakfast. 1 hr before breakfast and 2 hours after meds   LINOLEIC  ACID-SUNFLOWER OIL PO Take 1 capsule by mouth daily.   magnesium oxide 400 MG tablet Commonly known as: MAG-OX Take 400 mg by mouth in the morning, at noon, and at bedtime.   metoprolol succinate 50 MG 24 hr tablet Commonly known as: TOPROL-XL Take 1 tablet (50 mg total) by mouth daily. Take with or immediately following a meal.   multivitamin-lutein Caps capsule Take 1 capsule by mouth daily.   OVER THE COUNTER MEDICATION 1 application. Medication: Emuaid cream prn for   QC Tumeric Complex 500 MG Caps Generic drug: Turmeric Take 500 mg by mouth daily.   thyroid 120 MG tablet Commonly known as: Armour Thyroid Take 1 tablet (120 mg total) by mouth daily before breakfast.   vitamin C 100 MG tablet Take 100 mg by mouth daily.   Vitamin D3 10 MCG (400 UNIT) Caps Take 400 Units by mouth daily.   vitamin E 1000 UNIT capsule Take 1,000 Units by mouth daily.   VITAMIN-B COMPLEX PO Take by mouth daily.   warfarin 5 MG tablet Commonly known as: COUMADIN Take as directed. If you are unsure how to take this medication, talk to your nurse or doctor. Original instructions: Take 1 tablet (5 mg total) by mouth daily. Refer to most recent anticoagulation note for most accurate dosing instructions.      No Known Allergies  Follow-up Information    [name], MD Follow up on 02/04/2020.   Specialty: Internal Medicine Why: hospital discharge follow up, repeat bmp ( kidney function) on Tuesday. monitor colchicine dose. pcp to monitor right lower abdomen fluids collection.  Contact information: [name] [address] [phone]        Nigel Mormon, MD Follow up in 4 week(s).   Specialties: Cardiology, Radiology Why: for pericarditis and afib Contact information: [place] [address] [phone]                The results of significant diagnostics from this hospitalization (including imaging, microbiology,  ancillary and laboratory) are listed below for reference.    Significant Diagnostic Studies: DG Chest 2 View  Result Date: 02/01/2020 CLINICAL DATA:  Chest pain EXAM: CHEST - 2 VIEW COMPARISON:  October 19, 2019 FINDINGS: Mediastinal contour is normal. The aorta is tortuous. Heart size is enlarged. There is no focal infiltrate, pulmonary edema, or pleural effusion. Degenerative joint changes of the spine with scoliosis are noted. IMPRESSION: No active cardiopulmonary disease.  Cardiomegaly unchanged. Electronically  Signed   By: Abelardo Diesel M.D.   On: 02/01/2020 08:55   ECHOCARDIOGRAM COMPLETE  Result Date: 02/01/2020    ECHOCARDIOGRAM REPORT   Patient Name:   ADELY FACER Date of Exam: 02/01/2020 Medical Rec #:  [id number]    Height:       60.0 in Accession #:    [id number]   Weight:       154.3 lb Date of Birth:  07-22-1925    BSA:          1.672 m Patient Age:    28 years     BP:           139/76 mmHg Patient Gender: F            HR:           88 bpm. Exam Location:  Inpatient Procedure: 2D Echo Indications:    chest pain 786.50  History:        Patient has prior history of Echocardiogram examinations, most                 recent 02/01/2020. CHF, Arrythmias:Atrial Fibrillation; Risk                 Factors:Hypertension.  Sonographer:    Johny Chess Referring Phys: [id number] [place]  1. Left ventricular ejection fraction, by estimation, is 50 to 55%. The left ventricle has low normal function. The left ventricle has no regional wall motion abnormalities. Left ventricular diastolic parameters are indeterminate.  2. Right ventricular systolic function is mildly reduced. The right ventricular size is mildly enlarged. Mildly increased right ventricular wall thickness. There is moderately elevated pulmonary artery systolic pressure. The estimated right ventricular systolic pressure is 30.8 mmHg.  3. Left atrial size was moderately dilated.  4. Right atrial size was moderately  dilated.  5. The mitral valve is grossly normal. No evidence of mitral valve regurgitation.  6. Tricuspid valve regurgitation is moderate.  7. The aortic valve was not well visualized. There is moderate calcification of the aortic valve. Aortic valve regurgitation is not visualized. Comparison(s): Changes from prior study are noted. Difficult comparison from prior study in the setting of arrhythmia: slight decrease in RV function, improvement in mitral regurgitation noted. FINDINGS  Left Ventricle: Left ventricular ejection fraction, by estimation, is 50 to 55%. The left ventricle has low normal function. The left ventricle has no regional wall motion abnormalities. The left ventricular internal cavity size was normal in size. There is no left ventricular hypertrophy. Left ventricular diastolic parameters are indeterminate. Right Ventricle: The right ventricular size is mildly enlarged. Mildly increased right ventricular wall thickness. Right ventricular systolic function is mildly reduced. There is moderately elevated pulmonary artery systolic pressure. The tricuspid regurgitant velocity is 3.72 m/s, and with an assumed right atrial pressure of 3 mmHg, the estimated right ventricular systolic pressure is 65.7 mmHg. Left Atrium: Left atrial size was moderately dilated. Right Atrium: Right atrial size was moderately dilated. Pericardium: There is no evidence of pericardial effusion. Mitral Valve: The mitral valve is grossly normal. No evidence of mitral valve regurgitation. Tricuspid Valve: The tricuspid valve is grossly normal. Tricuspid valve regurgitation is moderate. Aortic Valve: The aortic valve was not well visualized. There is moderate calcification of the aortic valve. Aortic valve regurgitation is not visualized. Pulmonic Valve: The pulmonic valve was not well visualized. Pulmonic valve regurgitation is not visualized. Aorta: The aortic root is normal in size and structure. Venous: The pulmonary veins were  not well visualized. IAS/Shunts: The atrial septum is grossly normal.  LEFT VENTRICLE PLAX 2D LVIDd:         4.50 cm LVIDs:         3.10 cm LV PW:         0.90 cm LV IVS:        0.90 cm LVOT diam:     2.10 cm LV SV:         43 LV SV Index:   26 LVOT Area:     3.46 cm  IVC IVC diam: 1.70 cm LEFT ATRIUM             Index       RIGHT ATRIUM           Index LA diam:        3.70 cm 2.21 cm/m  RA Area:     18.40 cm LA Vol (A2C):   71.0 ml 42.47 ml/m RA Volume:   47.90 ml  28.65 ml/m LA Vol (A4C):   68.9 ml 41.21 ml/m LA Biplane Vol: 70.9 ml 42.41 ml/m  AORTIC VALVE LVOT Vmax:   69.20 cm/s LVOT Vmean:  43.200 cm/s LVOT VTI:    0.125 m  AORTA Ao Root diam: 3.40 cm Ao Asc diam:  3.30 cm TRICUSPID VALVE TR Peak grad:   55.4 mmHg TR Vmax:        372.00 cm/s  SHUNTS Systemic VTI:  0.12 m Systemic Diam: 2.10 cm Rudean Haskell MD Electronically signed by Rudean Haskell MD Signature Date/Time: 02/01/2020/2:06:08 PM    Final    CT Angio Chest/Abd/Pel for Dissection W and/or Wo Contrast  Result Date: 02/01/2020 CLINICAL DATA:  Chest/back pain.  Evaluate for dissection. EXAM: CT ANGIOGRAPHY CHEST, ABDOMEN AND PELVIS TECHNIQUE: Non-contrast CT of the chest was initially obtained. Multidetector CT imaging through the chest, abdomen and pelvis was performed using the standard protocol during bolus administration of intravenous contrast. Multiplanar reconstructed images and MIPs were obtained and reviewed to evaluate the vascular anatomy. CONTRAST:  125m OMNIPAQUE IOHEXOL 350 MG/ML SOLN COMPARISON:  Chest CT-10/19/2019; CT abdomen and pelvis-08/05/2019; 05/29/2019 FINDINGS: CTA CHEST FINDINGS Vascular Findings: Review of the precontrast images is negative for the presence of an intramural hematoma. No evidence of thoracic aortic aneurysm or dissection on this nongated examination. There is a large amount of eccentric predominantly calcified atherosclerotic plaque throughout the thoracic aorta, including slightly  irregular noncalcified plaque/mural thrombus involving the posterior aspect of the aortic arch (image 26, series 6), not resulting in a hemodynamically significant stenosis. Cardiomegaly. Small pericardial effusion, increased in size compared to the 10/19/2019 examination. Coronary artery calcifications. Although this examination was not tailored for the evaluation the pulmonary arteries, there are no discrete filling defects within the central pulmonary arterial tree to suggest central pulmonary embolism. Borderline enlarged caliber of the main pulmonary artery measuring 32 mm in diameter. ------------------------------------------------------------- Thoracic aortic measurements: Sinotubular junction 29 mm as measured in greatest oblique short axis coronal dimension. Proximal ascending aorta 32 mm as measured in greatest oblique short axis axial dimension at the level of the main pulmonary artery and 32 mm in greatest oblique short axis coronal diameter (coronal image 59, series 9) Aortic arch aorta 29 mm as measured in greatest oblique short axis sagittal dimension. Proximal descending thoracic aorta 25 mm as measured in greatest oblique short axis axial dimension at the level of the main pulmonary artery. Distal descending thoracic aorta 23 mm as measured in greatest oblique short axis axial dimension  at the level of the diaphragmatic hiatus. Review of the MIP images confirms the above findings. ------------------------------------------------------------- Non-Vascular Findings: Mediastinum/Lymph Nodes: Scattered mediastinal hilar lymph nodes are unchanged to slightly reduced in size in the interval with index AP window lymph node now measuring 0.9 cm in greatest short axis diameter (image 34, series 6, previously, 1.3 cm. Bilateral hilar lymph nodes are grossly unchanged with index right suprahilar and index left infrahilar lymph nodes both measuring approximately 1 cm greatest short axis diameter (right-image  51, series 6; left-image 56, series 6). Lungs/Pleura: Mild apical predominant centrilobular emphysematous change. Minimal dependent subpleural ground-glass atelectasis. Subpleural nodular airspace opacity involving the subpleural aspect of the right lower lobe has increased in size, currently measuring 3.3 x 1.2 cm (image 91, series 7, previously, 2.6 x 1.1 cm when compared to abdominal CT performed 05/29/2019. Musculoskeletal: No acute or aggressive osseous abnormalities. Stigmata of dish within the thoracic spine. The thyroid appears atrophic. Note is made of an approximately 1.8 x 1.3 cm hypoattenuating nodule within the subdermal surface of the medial aspect the left upper chest (image 16, series 6, similar to the 10/19/2019 examination and while nonspecific again may represent a sebaceous cyst. _________________________________________________________ _________________________________________________________ CTA ABDOMEN AND PELVIS FINDINGS VASCULAR Aorta: Scattered large amount of irregular mixed calcified and noncalcified atherosclerotic plaque throughout the tortuous but normal caliber abdominal aorta, not resulting in hemodynamically significant stenosis. No abdominal aortic dissection or periaortic stranding. Celiac: There is a minimal amount of mixed calcified and noncalcified atherosclerotic plaque involving the origin of the celiac artery, not resulting in a hemodynamically significant stenosis. SMA: There is a moderate amount of mixed calcified and noncalcified atherosclerotic plaque involving the main trunk of the SMA approaching 50% luminal narrowing at its mid/distal aspect (image 138, series 6). The distal tributaries the SMA appear patent without discrete intraluminal filling defect to suggest distal embolism. Renals: Solitary bilaterally; there is a minimal to moderate amount of eccentric calcified atherosclerotic plaque involving the origin of the left renal artery approaching 50% luminal  narrowing (coronal image 73, series 9). There is a minimal amount of eccentric mixed calcified and noncalcified atherosclerotic plaque involving the origin of the right renal artery, not definitely resulting in hemodynamically significant narrowing. No vessel irregularity to suggest FMD. IMA: Disease at its origin though remains patent. Inflow: There is a minimal amount of eccentric mixed calcified and noncalcified atherosclerotic plaque involving the bilateral tortuous but normal caliber common iliac arteries, not resulting in hemodynamically significant stenosis. The bilateral internal iliac arteries are diseased but remain patent. The bilateral external iliac arteries are mildly diseased and tortuous though without a hemodynamically significant narrowing. There is a minimal amount of calcified atherosclerotic plaque involving the bilateral common femoral arteries, not resulting in hemodynamically significant stenosis. Eccentric noncalcified atherosclerotic plaque involves the origin of the right deep femoral artery approaching 50% luminal narrowing (image 249, series 6). The imaged portions of the left deep femoral and bilateral superficial femoral arteries appear patent without a definitive hemodynamically significant narrowing. Veins: The IVC and pelvic venous systems appear patent on this arterial phase examination. Review of the MIP images confirms the above findings. _________________________________________________________ NON-VASCULAR Hepatobiliary: There is mild nodularity hepatic contour. No discrete hepatic lesions. Ill-defined layering gallstones are seen within the neck of the gallbladder (image 145, series 6). The gallbladder appears distended however there is no definitive gallbladder wall thickening or pericholecystic stranding. No intra or extrahepatic biliary duct dilatation. No ascites. Pancreas: Normal appearance of the pancreas. Spleen: Normal appearance of the  spleen. Several punctate  splenules are noted about the splenic hilum. Adrenals/Urinary Tract: There is symmetric enhancement of the bilateral kidneys. Renal cysts are seen bilaterally with dominant exophytic cyst arising from the anterior superior pole the left kidney measuring 1.6 cm (image 126, series 6 and dominant cyst arising from the superior pole the right kidney measuring 3.2 cm (image 128, series 6). No definite evidence of nephrolithiasis on this postcontrast examination. There is a minimal amount of likely age and body habitus related bilateral perinephric stranding. No urine obstruction. Mild thickening of the left adrenal gland without discrete nodule. Normal appearance of the right adrenal gland. Normal appearance of the urinary bladder given degree of distention. Stomach/Bowel: Scattered colonic diverticulosis without evidence of superimposed acute diverticulitis. Normal appearance of the terminal ileum. Interval removal of periappendiceal drainage catheter with interval appendectomy though there remains an approximately 4.9 x 4.2 x 4.1 cm apparent fluid collection within right lower abdominal quadrant. No adjacent mesenteric stranding. Moderate colonic stool burden without evidence of enteric obstruction. No discrete areas of bowel wall thickening. No pneumoperitoneum, pneumatosis or portal venous gas. Lymphatic: There is an approximately 1.7 cm presumed lymph node within the root of the right lower quadrant abdominal mesentery (image 173, series 6, previously, 1.2 cm. Additional scattered retroperitoneal lymph nodes are numerous though individually not enlarged by size criteria. No additional bulky retroperitoneal, mesenteric, pelvic or inguinal Reproductive: Normal appearance of the pelvic organs for age. No discrete adnexal lesion. Other: Note is made of a small mesenteric fat containing periumbilical hernia. There is a minimal amount of subcutaneous edema about the midline of the low back. Musculoskeletal: No acute or  aggressive osseous abnormalities. Unchanged moderate (approximately 50%) compression deformity involving the superior endplate of the G81 vertebral body, similar to abdominal CT performed 05/29/2019. Mild-to-moderate scoliotic curvature of the thoracolumbar spine associated multilevel moderate severe DDD, worse at L1-L2 and L4-L5. Review of the MIP images confirms the above findings. IMPRESSION: Chest CTA impression: 1. No acute cardiopulmonary disease. Specifically, no evidence of thoracic aortic aneurysm or dissection on this nongated examination. No evidence of central pulmonary embolism. 2. Cardiomegaly with small pericardial effusion, increased in size compared to the 10/19/2019 examination - further evaluation cardiac echo could be performed as clinically indicated. 3. Coronary artery calcifications. Aortic Atherosclerosis (ICD10-I70.0). 4. Enlargement right lower lobe sub pleural masslike opacity currently measuring 3.3 cm, previously 2.6 cm, when compared to abdominal CT 05/2019. As malignancy is not excluded on the basis of this examination, further evaluation with PET-CT could performed as clinically indicated. 5. Redemonstrated nonspecific prominent though non pathologically mediastinal and bilateral hilar lymph nodes, unchanged to slightly improved compared to the 10/19/2018 examination. Abdomen and pelvic CTA impression: 1. Large amount of mixed calcified and noncalcified atherosclerotic plaque throughout tortuous but normal caliber abdominal aorta, not resulting in hemodynamically significant stenosis. 2. Post appendectomy and removal of percutaneous drainage catheter with residual/recurrent approximately 4.9 cm apparent fluid collection/phlegmon within the right lower abdominal quadrant and interval increase in size of presumably reactive lymph node within the root of the right lower quadrant abdominal mesentery, currently measuring 1.7 cm, previously 1.2 cm. No evidence of enteric obstruction. 3.  Cholelithiasis with distension of the gallbladder but without gallbladder wall thickening or pericholecystic fluid. If there is clinical concern for acute cholecystitis, further evaluation with right upper quadrant abdominal ultrasound could be performed as clinically indicated. 4. Nodularity hepatic contour as could be seen in the setting of early cirrhotic change. Correlation with LFTs is advised. 5. Unchanged  moderate (approximately 25%) compression deformity involving the T12 vertebral body. 6. Mild-to-moderate scoliotic curvature of the thoracolumbar spine associated multilevel moderate severe DDD, worse at L1-L2 and L4-L5. Electronically Signed   By: Sandi Mariscal M.D.   On: 02/01/2020 10:24    Microbiology: Recent Results (from the past 240 hour(s))  Respiratory Panel by RT PCR (Flu A&B, Covid) - Nasopharyngeal Swab     Status: None   Collection Time: 02/01/20 12:19 PM   Specimen: Nasopharyngeal Swab  Result Value Ref Range Status   SARS Coronavirus 2 by RT PCR NEGATIVE NEGATIVE Final    Comment: (NOTE) SARS-CoV-2 target nucleic acids are NOT DETECTED.  The SARS-CoV-2 RNA is generally detectable in upper respiratoy specimens during the acute phase of infection. The lowest concentration of SARS-CoV-2 viral copies this assay can detect is 131 copies/mL. A negative result does not preclude SARS-Cov-2 infection and should not be used as the sole basis for treatment or other patient management decisions. A negative result may occur with  improper specimen collection/handling, submission of specimen other than nasopharyngeal swab, presence of viral mutation(s) within the areas targeted by this assay, and inadequate number of viral copies (<131 copies/mL). A negative result must be combined with clinical observations, patient history, and epidemiological information. The expected result is Negative.  Fact Sheet for Patients:  PinkCheek.be  Fact Sheet for  Healthcare Providers:  GravelBags.it  This test is no t yet approved or cleared by the [place] FDA and  has been authorized for detection and/or diagnosis of SARS-CoV-2 by FDA under an Emergency Use Authorization (EUA). This EUA will remain  in effect (meaning this test can be used) for the duration of the COVID-19 declaration under Section 564(b)(1) of the Act, 21 U.S.C. section 360bbb-3(b)(1), unless the authorization is terminated or revoked sooner.     Influenza A by PCR NEGATIVE NEGATIVE Final   Influenza B by PCR NEGATIVE NEGATIVE Final    Comment: (NOTE) The Xpert Xpress SARS-CoV-2/FLU/RSV assay is intended as an aid in  the diagnosis of influenza from Nasopharyngeal swab specimens and  should not be used as a sole basis for treatment. Nasal washings and  aspirates are unacceptable for Xpert Xpress SARS-CoV-2/FLU/RSV  testing.  Fact Sheet for Patients: PinkCheek.be  Fact Sheet for Healthcare Providers: GravelBags.it  This test is not yet approved or cleared by the [place] FDA and  has been authorized for detection and/or diagnosis of SARS-CoV-2 by  FDA under an Emergency Use Authorization (EUA). This EUA will remain  in effect (meaning this test can be used) for the duration of the  Covid-19 declaration under Section 564(b)(1) of the Act, 21  U.S.C. section 360bbb-3(b)(1), unless the authorization is  terminated or revoked. Performed at [facility], [place] [address] [place], [place] [address]      Labs: Basic Metabolic Panel: Recent Labs  Lab 02/01/20 0622 02/02/20 0200  NA 141 138  K 3.6 4.0  CL 98 99  CO2 31 28  GLUCOSE 134* 151*  BUN 32* 43*  CREATININE 1.09* 1.40*  CALCIUM 9.3 8.8*   Liver Function Tests: Recent Labs  Lab 02/02/20 0200  AST 24  ALT 24  ALKPHOS 57  BILITOT 1.1  PROT 6.7  ALBUMIN 2.7*   No results for input(s): LIPASE,  AMYLASE in the last 168 hours. No results for input(s): AMMONIA in the last 168 hours. CBC: Recent Labs  Lab 02/01/20 0622  WBC 9.0  HGB 15.1*  HCT 47.4*  MCV  96.7  PLT 167   Cardiac Enzymes: No results for input(s): CKTOTAL, CKMB, CKMBINDEX, TROPONINI in the last 168 hours. BNP: BNP (last 3 results) Recent Labs    05/29/19 1223 10/19/19 2045  BNP 445.7* 475.4*    ProBNP (last 3 results) No results for input(s): PROBNP in the last 8760 hours.  CBG: No results for input(s): GLUCAP in the last 168 hours.     Signed:  Florencia Reasons MD, PhD, FACP  [facility] 02/02/2020, 5:38 PM

## 2020-02-02 NOTE — Progress Notes (Addendum)
[place] for warfarin Indication: atrial fibrillation  No Known Allergies  Patient Measurements: Height: 5' (152.4 cm) Weight: 70 kg (154 lb 5.2 oz) IBW/kg (Calculated) : 45.5  Vital Signs: Temp: 98.4 F (36.9 C) (10/10 0359) Temp Source: Oral (10/10 0359) BP: 121/65 (10/10 0359) Pulse Rate: 84 (10/10 0359)  Labs: Recent Labs    02/01/20 0622 02/01/20 0830 02/02/20 0200  HGB 15.1*  --   --   HCT 47.4*  --   --   PLT 167  --   --   LABPROT  --  16.8* 18.9*  INR  --  1.4* 1.6*  CREATININE 1.09*  --  1.40*  TROPONINIHS 18* 17  --     Estimated Creatinine Clearance: 22.4 mL/min (A) (by C-G formula based on SCr of 1.4 mg/dL (H)).   Medical History: Past Medical History:  Diagnosis Date  . Arthritis   . Atrial fibrillation ([place])   . Cataract   . CHF (congestive heart failure) ([place])   . Hypertension   . Osteoporosis   . Thyroid disease    Assessment: 59 yof presented admitted for pericarditis. She is on chronic warfarin for afib which will continue while admitted. On admit, INR was subtherapeutic at 1.4.  PTA warfarin regimen: 5mg  daily, last PTA dose 10/8 per pt.  Today, INR remains subtherapeutic at 1.6 but trending up. Patient's afib is rate controlled. No bleeding reported. Most recent Hgb normal. Given pt's older age increasing risk for bleeding, will continue with slightly higher dose tonight.  Goal of Therapy:  INR 2-3 Monitor platelets by anticoagulation protocol: Yes   Plan:  Warfarin 7.5mg  PO x 1 today Daily INR, CBC  Fara Olden, PharmD PGY-1 Pharmacy Resident 02/02/2020 7:59 AM Please see AMION for all pharmacy numbers

## 2020-02-04 NOTE — Telephone Encounter (Signed)
Spoke with the patient's daughter. Okay to take lasix 20 mg.   I will see her on 10/15 at 10:15 AM  Thanks MJP

## 2020-02-06 NOTE — Progress Notes (Signed)
Patient referred by [name], MD for atrial fibrillation  Subjective:   Ashley Dunn, female    DOB: 01-Mar-1926, 86 y.o.   MRN: [id number]   Chief Complaint  Patient presents with  . Atrial Fibrillation  . Follow-up    HPI  86 y.o. Caucasian female with hyperthyroidism, controlled hypertension, persistent atrial flutter/fibrillation,appendix carcinoma, admitted with acute on chronic systolic/diastolic heart failure  Patient was hospitalized earlier this month with complaints of chest pain.  Pain was pleuritic and positional.  Work-up with CT chest showed small pericardial effusion, not appreciated on echocardiogram.  ESR and CRP were both elevated.  She was treated for possible pericarditis with IV steroid.  Decision for colchicine was deferred to me.  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  Patient is doing well. She has not had any recurrent chest pain symptoms. Her lasix was held due to mild increase in Cr. However, she developed leg swelling., Therefore, I asked her to resume lasix at 20 mg daily. Leg swelling has improved.    Current Outpatient Medications on File Prior to Visit  Medication Sig Dispense Refill  . acetaminophen (TYLENOL) 500 MG tablet Take 2 tablets (1,000 mg total) by mouth every 6 (six) hours as needed for mild pain or fever.    . Ascorbic Acid (VITAMIN C) 100 MG tablet Take 100 mg by mouth daily.    . B Complex Vitamins (VITAMIN-B COMPLEX PO) Take by mouth daily.    . Calcium 150 MG TABS Take 150 mg by mouth daily.    Marland Kitchen CALCIUM PO Take 1 tablet by mouth in the morning, at noon, in the evening, and at bedtime.    . Cholecalciferol (VITAMIN D3) 10 MCG (400 UNIT) CAPS Take 400 Units by mouth daily.    . colchicine 0.6 MG tablet Take 0.5 tablets (0.3 mg total) by mouth 2 (two) times daily. 30 tablet 1  . furosemide (LASIX) 20 MG tablet  Take 20 mg by mouth.    . Garlic 081 MG TABS Take 100 mg by mouth daily.    Marland Kitchen lactobacillus acidophilus (BACID) TABS tablet Take 1 tablet by mouth daily with breakfast. 1 hr before breakfast and 2 hours after meds    . LINOLEIC ACID-SUNFLOWER OIL PO Take 1 capsule by mouth daily.    . magnesium oxide (MAG-OX) 400 MG tablet Take 400 mg by mouth in the morning, at noon, and at bedtime.    . metoprolol succinate (TOPROL-XL) 50 MG 24 hr tablet Take 1 tablet (50 mg total) by mouth daily. Take with or immediately following a meal. 30 tablet 1  . multivitamin-lutein (OCUVITE-LUTEIN) CAPS capsule Take 1 capsule by mouth daily.    . Omega-3 Fatty Acids (FISH OIL) 1000 MG CAPS Take 1,000 mg by mouth daily.    Marland Kitchen OVER THE COUNTER MEDICATION 1 application. Medication: Emuaid cream prn for    . thyroid (ARMOUR THYROID) 120 MG tablet Take 1 tablet (120 mg total) by mouth daily before breakfast. 90 tablet 0  . Turmeric (QC TUMERIC COMPLEX) 500 MG CAPS Take 500 mg by mouth daily.    . vitamin E 1000 UNIT capsule Take 1,000 Units by mouth daily.    Marland Kitchen warfarin (COUMADIN) 5 MG tablet Take 1 tablet (5 mg total) by mouth daily. Refer to most recent anticoagulation note for most accurate dosing instructions. 90 tablet 3   No current facility-administered  medications on file prior to visit.    Cardiovascular studies:  EKG 02/07/2020: Atrial fibrillation 99 bpm Low voltage precordial leads Left anterior fascicular block Cannot exclude old inferior and anterior infarct  Frequent PVC  Echocardiogram 06/28/2019:  1. Mildly depressed LV systolic function with visual EF 45-50%. Left  ventricle cavity is normal in size. Mild left ventricular hypertrophy.  Mildly reduced global wall motion. Elevated LAP. Unable to evaluate  diastolic function due to underlying rhythm. Calculated EF 46%.  2. Left atrial cavity is severely dilated.  3. Visually, right atrial cavity is mild to moderately dilated.  4. Mild (Grade I)  mitral regurgitation.  5. Moderate tricuspid regurgitation. Mild pulmonary hypertension. RVSP  measures 44 mmHg.  6. No prior study for comparison.  EKG 02/27/2019: Atypical atrial flutter with variable AV conduction, ventricular rate 96 bpm. Nonspecific ST depression  -Nondiagnostic.   Recent labs: 02/02/2020: Glucose 151, BUN/Cr 43/1.4. EGFR 33. Na/K 138/4.0.  H/H 15/47. MCV 96. Platelets 167 ESR 52, CRP 22, both elevated INR 1.6 TSH 0.9 normal  10/23/2019: Glucose 92, BUN/Cr 46/1.03. EGFR 47. Na/K 144/4.2.  H/H 12.6/38.6. MCV 98. Platelets 152 INR 1.8 TSH 1.4 normal BNP 475 (10/19/2019)  01/2019: Chol 165, TG 67, HDL 48, LDL 104  05/29/2019: BNP 345  04/29/2019: TSH 0.043, free T4 1.5   Review of Systems  Cardiovascular: Negative for chest pain, dyspnea on exertion, leg swelling, palpitations and syncope.        Vitals:   02/07/20 1009  BP: (!) 159/86  Pulse: (!) 40  Resp: 16  SpO2: 95%     Body mass index is 29.69 kg/m. Filed Weights   02/07/20 1009  Weight: 152 lb (68.9 kg)     Objective:   Physical Exam Vitals and nursing note reviewed.  Constitutional:      Appearance: She is well-developed.  Neck:     Vascular: No JVD.  Cardiovascular:     Rate and Rhythm: Normal rate. Rhythm irregularly irregular.     Pulses: Intact distal pulses.     Heart sounds: No murmur heard.      Comments: Bilateral LE varicocities Pulmonary:     Effort: Pulmonary effort is normal.     Breath sounds: Normal breath sounds. No wheezing or rales.  Musculoskeletal:     Right lower leg: No edema.     Left lower leg: No edema.         Assessment & Recommendations:    86 y.o. Caucasian female with hyperthyroidism, controlled hypertension, persistent atrial flutter/fibrillation,appendix carcinoma, admitted with acute on chronic systolic/diastolic heart failure  Pericarditis: Pleuritic chest pain, elevated ESR, CRP, trivial pericardial effusion, during  hospitalization in 01/2020. Now asymptomatic. Colchicine not approved by insurance. Okay to hold off. I suspect this may be related to her malignancy. Need to monitor for any recurrence. I will repeat echocardiogram in a few weeks  HFpEF: Clinically euvolumic, Continue lasix 20 mg daily.  She would prefer not starting any new medications.  Permanent atrial fibrillation/flutter: Rate fairly well controlled on metoprolol succinate 50 mg daily.  CHA2DS2VASc score 3, annual stroke risk 3.2%. Goal: 2.0-3.0 Indication: Afib Today's INR: 2.8 Current dose: 5 mg daily New dose: 5 mg daily Next INR check: 2 weeks    Faylinn Schwenn Esther Hardy, MD [facility] Pager: [phone] Office: [phone]

## 2020-02-07 ENCOUNTER — Other Ambulatory Visit: Payer: Self-pay

## 2020-02-07 ENCOUNTER — Encounter: Payer: Self-pay | Admitting: Cardiology

## 2020-02-07 ENCOUNTER — Ambulatory Visit: Payer: Medicare Other | Admitting: Cardiology

## 2020-02-07 VITALS — BP 159/86 | HR 40 | Resp 16 | Ht 60.0 in | Wt 152.0 lb

## 2020-02-07 DIAGNOSIS — Z7901 Long term (current) use of anticoagulants: Secondary | ICD-10-CM

## 2020-02-07 DIAGNOSIS — I5032 Chronic diastolic (congestive) heart failure: Secondary | ICD-10-CM

## 2020-02-07 DIAGNOSIS — I4821 Permanent atrial fibrillation: Secondary | ICD-10-CM

## 2020-02-07 DIAGNOSIS — I300 Acute nonspecific idiopathic pericarditis: Secondary | ICD-10-CM

## 2020-02-07 LAB — POCT INR: INR: 2.8 (ref 2.0–3.0)

## 2020-02-19 ENCOUNTER — Ambulatory Visit: Payer: Medicare Other | Admitting: Cardiology

## 2020-03-02 ENCOUNTER — Other Ambulatory Visit: Payer: Medicare Other

## 2020-03-02 ENCOUNTER — Other Ambulatory Visit: Payer: Self-pay

## 2020-03-02 ENCOUNTER — Ambulatory Visit: Payer: Medicare Other

## 2020-03-02 ENCOUNTER — Telehealth: Payer: Self-pay | Admitting: Cardiology

## 2020-03-02 ENCOUNTER — Ambulatory Visit: Payer: Medicare Other | Admitting: Pharmacist

## 2020-03-02 DIAGNOSIS — I480 Paroxysmal atrial fibrillation: Secondary | ICD-10-CM

## 2020-03-02 DIAGNOSIS — Z7901 Long term (current) use of anticoagulants: Secondary | ICD-10-CM

## 2020-03-02 DIAGNOSIS — I300 Acute nonspecific idiopathic pericarditis: Secondary | ICD-10-CM

## 2020-03-02 DIAGNOSIS — Z5181 Encounter for therapeutic drug level monitoring: Secondary | ICD-10-CM

## 2020-03-02 LAB — POCT INR: INR: 2.6 (ref 2.0–3.0)

## 2020-03-02 NOTE — Telephone Encounter (Signed)
Called and spoke with patient daughter, she stated that the patient is feeling better now and will go to see PCP is it occurs again.

## 2020-03-02 NOTE — Telephone Encounter (Signed)
Patient has been having frequent urination last night around 9:30 through out the night about every 15 mins and then tapered to about every 30 mins . She did not take her "fluid pill " because of this . She said everything seems to be back to normal for now but just FYI .

## 2020-03-02 NOTE — Progress Notes (Signed)
Anticoagulation Management Ashley Dunn is a 86 y.o. female who reports to the clinic for monitoring of warfarin treatment.    Indication: atrial fibrillation CHA2DS2 Vasc Score 5 (age >34, Female, CHF hx, HTN hx), HAS-BLED 1 (Age >34)   Duration: indefinite Supervising physician: [facility] Visit History:  Patient does not report signs/symptoms of bleeding or thromboembolism   Other recent changes: No changes in diet, medications, lifestyle. Reports to remain consistent with changes in her diet and limiting salt intake. Denies any complains of CP, edema, SOB, dyspnea.   Anticoagulation Episode Summary    Current INR goal:  2.0-3.0  TTR:  55.6 % (9.2 mo)  Next INR check:  03/30/2020  INR from last check:  2.6 (03/02/2020)  Weekly max warfarin dose:    Target end date:  Indefinite  INR check location:    Preferred lab:    Send INR reminders to:     Indications   Atrial fibrillation (HCC) [I48.91] Monitoring for long-term anticoagulant use [Z51.81 Z79.01]       Comments:          No Known Allergies  Current Outpatient Medications:  .  acetaminophen (TYLENOL) 500 MG tablet, Take 2 tablets (1,000 mg total) by mouth every 6 (six) hours as needed for mild pain or fever., Disp: , Rfl:  .  Ascorbic Acid (VITAMIN C) 100 MG tablet, Take 100 mg by mouth daily., Disp: , Rfl:  .  B Complex Vitamins (VITAMIN-B COMPLEX PO), Take by mouth daily., Disp: , Rfl:  .  Calcium 150 MG TABS, Take 150 mg by mouth daily., Disp: , Rfl:  .  CALCIUM PO, Take 1 tablet by mouth in the morning, at noon, in the evening, and at bedtime., Disp: , Rfl:  .  Cholecalciferol (VITAMIN D3) 10 MCG (400 UNIT) CAPS, Take 400 Units by mouth daily., Disp: , Rfl:  .  furosemide (LASIX) 20 MG tablet, Take 20 mg by mouth., Disp: , Rfl:  .  Garlic 676 MG TABS, Take 100 mg by mouth daily., Disp: , Rfl:  .  lactobacillus acidophilus (BACID) TABS tablet, Take 1 tablet by mouth daily with  breakfast. 1 hr before breakfast and 2 hours after meds, Disp: , Rfl:  .  LINOLEIC ACID-SUNFLOWER OIL PO, Take 1 capsule by mouth daily., Disp: , Rfl:  .  magnesium oxide (MAG-OX) 400 MG tablet, Take 400 mg by mouth in the morning, at noon, and at bedtime., Disp: , Rfl:  .  metoprolol succinate (TOPROL-XL) 50 MG 24 hr tablet, Take 1 tablet (50 mg total) by mouth daily. Take with or immediately following a meal., Disp: 30 tablet, Rfl: 1 .  Omega-3 Fatty Acids (FISH OIL) 1000 MG CAPS, Take 1,000 mg by mouth daily., Disp: , Rfl:  .  thyroid (ARMOUR THYROID) 120 MG tablet, Take 1 tablet (120 mg total) by mouth daily before breakfast., Disp: 90 tablet, Rfl: 0 .  Turmeric (QC TUMERIC COMPLEX) 500 MG CAPS, Take 500 mg by mouth daily., Disp: , Rfl:  .  vitamin E 1000 UNIT capsule, Take 1,000 Units by mouth daily., Disp: , Rfl:  .  warfarin (COUMADIN) 5 MG tablet, Take 1 tablet (5 mg total) by mouth daily. Refer to most recent anticoagulation note for most accurate dosing instructions., Disp: 90 tablet, Rfl: 3 Past Medical History:  Diagnosis Date  . Arthritis   . Atrial fibrillation ([place])   . Cataract   . CHF (congestive heart failure) ([place])   .  Hypertension   . Osteoporosis   . Thyroid disease    ASSESSMENT  Recent Results: The most recent result is correlated with 35 mg per week:  Lab Results  Component Value Date   INR 2.6 03/02/2020   INR 2.8 02/07/2020   INR 1.6 (H) 02/02/2020    Anticoagulation Dosing: Description   INR at goal. Continue weekly dose of 5 mg everyday. Recheck INR in 5 weeks      INR today: Therapeutic continues to remain therapeutic on the current weekly dose. Denies any complains of bleeding or bruising symptoms. Denies any other relevant changes in her diet, medications, or lifestyle.   PLAN Weekly dose was unchanged by 0% to 35 mg/week.Continue with weekly dose of 5 mg everyday. Recheck in 5 weeks  Patient Instructions  INR at goal. Continue weekly dose of 5  mg everyday. Recheck INR in 5 weeks   Patient advised to contact clinic or seek medical attention if signs/symptoms of bleeding or thromboembolism occur.  Patient verbalized understanding by repeating back information and was advised to contact me if further medication-related questions arise.   Follow-up Return in about 5 weeks (around 04/06/2020).  [name], PharmD  15 minutes spent face-to-face with the patient during the encounter. 50% of time spent on education, including signs/sx bleeding and clotting, as well as food and drug interactions with warfarin. 50% of time was spent on fingerprick POC INR sample collection,processing, results determination, and documentation

## 2020-03-02 NOTE — Telephone Encounter (Signed)
Okay. If she continues to have recurrent urination or burning, please ask her to check with her PCP if this could be UTI.  Thanks MJP

## 2020-03-02 NOTE — Patient Instructions (Signed)
INR at goal. Continue weekly dose of 5 mg everyday. Recheck INR in 5 weeks

## 2020-03-10 ENCOUNTER — Emergency Department (HOSPITAL_COMMUNITY)
Admission: EM | Admit: 2020-03-10 | Discharge: 2020-03-10 | Disposition: A | Discharge status: Home / Self Care | Payer: Medicare Other | Attending: Emergency Medicine | Admitting: Emergency Medicine

## 2020-03-10 ENCOUNTER — Emergency Department (HOSPITAL_COMMUNITY): Payer: Medicare Other

## 2020-03-10 ENCOUNTER — Other Ambulatory Visit: Payer: Self-pay

## 2020-03-10 DIAGNOSIS — Z79899 Other long term (current) drug therapy: Secondary | ICD-10-CM | POA: Insufficient documentation

## 2020-03-10 DIAGNOSIS — Z7901 Long term (current) use of anticoagulants: Secondary | ICD-10-CM | POA: Insufficient documentation

## 2020-03-10 DIAGNOSIS — R0789 Other chest pain: Secondary | ICD-10-CM | POA: Insufficient documentation

## 2020-03-10 DIAGNOSIS — I4891 Unspecified atrial fibrillation: Secondary | ICD-10-CM | POA: Diagnosis not present

## 2020-03-10 DIAGNOSIS — E079 Disorder of thyroid, unspecified: Secondary | ICD-10-CM | POA: Insufficient documentation

## 2020-03-10 DIAGNOSIS — R079 Chest pain, unspecified: Secondary | ICD-10-CM

## 2020-03-10 DIAGNOSIS — I5023 Acute on chronic systolic (congestive) heart failure: Secondary | ICD-10-CM | POA: Insufficient documentation

## 2020-03-10 DIAGNOSIS — I110 Hypertensive heart disease with heart failure: Secondary | ICD-10-CM | POA: Diagnosis not present

## 2020-03-10 DIAGNOSIS — Z8509 Personal history of malignant neoplasm of other digestive organs: Secondary | ICD-10-CM | POA: Insufficient documentation

## 2020-03-10 LAB — COMPREHENSIVE METABOLIC PANEL
ALT: 23 U/L (ref 0–44)
AST: 32 U/L (ref 15–41)
Albumin: 2.6 g/dL — ABNORMAL LOW (ref 3.5–5.0)
Alkaline Phosphatase: 82 U/L (ref 38–126)
Anion gap: 11 (ref 5–15)
BUN: 26 mg/dL — ABNORMAL HIGH (ref 8–23)
CO2: 29 mmol/L (ref 22–32)
Calcium: 9.2 mg/dL (ref 8.9–10.3)
Chloride: 101 mmol/L (ref 98–111)
Creatinine, Ser: 1.1 mg/dL — ABNORMAL HIGH (ref 0.44–1.00)
GFR, Estimated: 47 mL/min — ABNORMAL LOW (ref >60)
Glucose, Bld: 122 mg/dL — ABNORMAL HIGH (ref 70–99)
Potassium: 3.9 mmol/L (ref 3.5–5.1)
Sodium: 141 mmol/L (ref 135–145)
Total Bilirubin: 0.9 mg/dL (ref 0.3–1.2)
Total Protein: 6.7 g/dL (ref 6.5–8.1)

## 2020-03-10 LAB — TROPONIN I (HIGH SENSITIVITY)
Troponin I (High Sensitivity): 18 ng/L — ABNORMAL HIGH (ref <18)
Troponin I (High Sensitivity): 19 ng/L — ABNORMAL HIGH (ref <18)

## 2020-03-10 LAB — CBC WITH DIFFERENTIAL/PLATELET
Abs Immature Granulocytes: 0.04 10*3/uL (ref 0.00–0.07)
Basophils Absolute: 0.1 10*3/uL (ref 0.0–0.1)
Basophils Relative: 1 %
Eosinophils Absolute: 0.1 10*3/uL (ref 0.0–0.5)
Eosinophils Relative: 1 %
HCT: 43 % (ref 36.0–46.0)
Hemoglobin: 13.8 g/dL (ref 12.0–15.0)
Immature Granulocytes: 0 %
Lymphocytes Relative: 13 %
Lymphs Abs: 1.2 10*3/uL (ref 0.7–4.0)
MCH: 31.3 pg (ref 26.0–34.0)
MCHC: 32.1 g/dL (ref 30.0–36.0)
MCV: 97.5 fL (ref 80.0–100.0)
Monocytes Absolute: 0.9 10*3/uL (ref 0.1–1.0)
Monocytes Relative: 10 %
Neutro Abs: 7 10*3/uL (ref 1.7–7.7)
Neutrophils Relative %: 75 %
Platelets: 240 10*3/uL (ref 150–400)
RBC: 4.41 MIL/uL (ref 3.87–5.11)
RDW: 14.6 % (ref 11.5–15.5)
WBC: 9.2 10*3/uL (ref 4.0–10.5)
nRBC: 0 % (ref 0.0–0.2)

## 2020-03-10 LAB — PROTIME-INR
INR: 2.2 — ABNORMAL HIGH (ref 0.8–1.2)
Prothrombin Time: 23.6 seconds — ABNORMAL HIGH (ref 11.4–15.2)

## 2020-03-10 LAB — BRAIN NATRIURETIC PEPTIDE: B Natriuretic Peptide: 286.3 pg/mL — ABNORMAL HIGH (ref 0.0–100.0)

## 2020-03-10 NOTE — Discharge Instructions (Signed)
You were seen today for chest pain, your work-up today was reassuring. Please follow-up with your cardiologist, Dr. Virgina Jock. Please use the attached instructions, if you have any new worsening concerning symptoms please come back to the emergency department.

## 2020-03-10 NOTE — ED Triage Notes (Signed)
BIB [facility] after pt called to report increasing C/P since 9 pm. Per EMS, pt awoke at 2 AM with radiating left sided C/P that radiated down left arm to back. PT has hx of CHF, HTN, afib. Pt given SL nitro x 1 and refused asprin reroute.    B/P 175/96 HR 62 Temp 98.2 02% 100 2 L [place] ( Pt not on home 02).

## 2020-03-10 NOTE — ED Provider Notes (Signed)
[facility] EMERGENCY DEPARTMENT Provider Note   CSN: [id number] Arrival date & time: 03/10/20  0357     History Chief Complaint  Patient presents with  . Chest Pain    afib RVR    Ashley Dunn is a 86 y.o. female.  The history is provided by the patient and medical records.  Chest Pain   86 year old female with history of arthritis, A. Fib on coumadin, CHF, hypertension, thyroid disease, here with chest pain.  States this began around 9PM last evening.  States she awoke around 2am and it seemed worse.  Pain left sided in nature, radiation to left shoulder, left upper back, and down left arm.  Denies SOB, diaphoresis, nausea, vomiting, dizziness.  States she usually takes her BBlocker, fluid pill, and thyroid medicine around 4am but did not take yet this AM.  Denies any abdominal pain.  She is followed by cardiology, Dr. Virgina Jock.  Given nitro en route and reports pain has improved but still mildly present.  Refused ASA.  Past Medical History:  Diagnosis Date  . Arthritis   . Atrial fibrillation ([place])   . Cataract   . CHF (congestive heart failure) ([place])   . Hypertension   . Osteoporosis   . Thyroid disease     Patient Active Problem List   Diagnosis Date Noted  . Anticoagulated on Coumadin   . Cancer ([place])   . Pericarditis 02/01/2020  . Chronic heart failure with preserved ejection fraction ([place])   . Acute on chronic systolic (congestive) heart failure ([place]) 10/20/2019  . 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 Acute on chronic systolic CHF (congestive heart failure) ([place]) 10/19/2019  . Monitoring for long-term anticoagulant use 10/15/2019  . Abscess of abdominal cavity ([place]) 08/05/2019  . Chronic kidney disease (CKD), stage III (moderate) ([place]) 08/05/2019  . Pre-op evaluation 06/17/2019  . Hypertension   . Osteoporosis   . Thyroid disease   . CHF (congestive heart failure) ([place])   . Arthritis   . Appendicitis with abscess 05/29/2019  . Atrial  fibrillation ([place]) 03/19/2019  . Permanent atrial fibrillation ([place]) 02/26/2019    Past Surgical History:  Procedure Laterality Date  . CESAREAN SECTION    . EYE SURGERY    . FRACTURE SURGERY    . IR CATHETER TUBE CHANGE  06/12/2019  . IR CATHETER TUBE CHANGE  08/05/2019  . IR RADIOLOGIST EVAL & MGMT  06/11/2019  . IR RADIOLOGIST EVAL & MGMT  06/25/2019  . IR RADIOLOGIST EVAL & MGMT  07/30/2019  . LAPAROSCOPIC APPENDECTOMY N/A 08/07/2019   Procedure: LAPAROSCOPIC  APPENDECTOMY;  Surgeon: Ralene Ok, MD;  Location: [facility];  Service: General;  Laterality: N/A;  . LAPAROSCOPY N/A 08/07/2019   Procedure: Laparoscopy Diagnostic and Lysis of Adhesions;  Surgeon: Ralene Ok, MD;  Location: [facility];  Service: General;  Laterality: N/A;     OB History   No obstetric history on file.     Family History  Problem Relation Age of Onset  . Stroke Mother   . Cancer Father   . Hyperlipidemia Brother   . Healthy Daughter   . Healthy Son   . Heart disease Maternal Grandmother   . Heart disease Maternal Grandfather     Social History   Tobacco Use  . Smoking status: Never Smoker  . Smokeless tobacco: Never Used  Substance Use Topics  . Alcohol use: Never  . Drug use: Never    Home Medications Prior to Admission medications  Medication Sig Start Date End Date Taking? Authorizing Provider  acetaminophen (TYLENOL) 500 MG tablet Take 2 tablets (1,000 mg total) by mouth every 6 (six) hours as needed for mild pain or fever. 08/09/19   Saverio Danker, PA-C  Ascorbic Acid (VITAMIN C) 100 MG tablet Take 100 mg by mouth daily.    [provider]  B Complex Vitamins (VITAMIN-B COMPLEX PO) Take by mouth daily.    [provider]  Calcium 150 MG TABS Take 150 mg by mouth daily.    [provider]  CALCIUM PO Take 1 tablet by mouth in the morning, at noon, in the evening, and at bedtime.    [provider]  Cholecalciferol (VITAMIN D3) 10 MCG (400 UNIT) CAPS  Take 400 Units by mouth daily.    [provider]  furosemide (LASIX) 20 MG tablet Take 20 mg by mouth.    [provider]  Garlic 923 MG TABS Take 100 mg by mouth daily.    [provider]  lactobacillus acidophilus (BACID) TABS tablet Take 1 tablet by mouth daily with breakfast. 1 hr before breakfast and 2 hours after meds    [provider]  LINOLEIC ACID-SUNFLOWER OIL PO Take 1 capsule by mouth daily.    [provider]  magnesium oxide (MAG-OX) 400 MG tablet Take 400 mg by mouth in the morning, at noon, and at bedtime.    [provider]  metoprolol succinate (TOPROL-XL) 50 MG 24 hr tablet Take 1 tablet (50 mg total) by mouth daily. Take with or immediately following a meal. 10/23/19   Hosie Poisson, MD  Omega-3 Fatty Acids (FISH OIL) 1000 MG CAPS Take 1,000 mg by mouth daily.    [provider]  thyroid (ARMOUR THYROID) 120 MG tablet Take 1 tablet (120 mg total) by mouth daily before breakfast. 08/02/19   Forrest Moron, MD  Turmeric (QC TUMERIC COMPLEX) 500 MG CAPS Take 500 mg by mouth daily.    [provider]  vitamin E 1000 UNIT capsule Take 1,000 Units by mouth daily.    [provider]  warfarin (COUMADIN) 5 MG tablet Take 1 tablet (5 mg total) by mouth daily. Refer to most recent anticoagulation note for most accurate dosing instructions. 01/16/20   Patwardhan, Reynold Bowen, MD    Allergies    Patient has no known allergies.  Review of Systems   Review of Systems  Cardiovascular: Positive for chest pain.  All other systems reviewed and are negative.   Physical Exam Updated Vital Signs BP (!) 175/96 (BP Location: Left Arm)   Pulse 62   Temp 98.2 F (36.8 C)   Resp 16   Ht 5' (1.524 m)   Wt 70.3 kg   SpO2 100%   BMI 30.27 kg/m   Physical Exam Vitals and nursing note reviewed.  Constitutional:      Appearance: She is well-developed.     Comments: Elderly, appears well  HENT:     Head:  Normocephalic and atraumatic.  Eyes:     Conjunctiva/sclera: Conjunctivae normal.     Pupils: Pupils are equal, round, and reactive to light.  Cardiovascular:     Rate and Rhythm: Normal rate and regular rhythm.     Heart sounds: Normal heart sounds.  Pulmonary:     Effort: Pulmonary effort is normal.     Breath sounds: Normal breath sounds. No wheezing or rhonchi.  Abdominal:     General: Bowel sounds are normal.  Palpations: Abdomen is soft.  Musculoskeletal:        General: Normal range of motion.     Cervical back: Normal range of motion.  Skin:    General: Skin is warm and dry.  Neurological:     Mental Status: She is alert and oriented to person, place, and time.     ED Results / Procedures / Treatments   Labs (all labs ordered are listed, but only abnormal results are displayed) Labs Reviewed  COMPREHENSIVE METABOLIC PANEL - Abnormal; Notable for the following components:      Result Value   Glucose, Bld 122 (*)    BUN 26 (*)    Creatinine, Ser 1.10 (*)    Albumin 2.6 (*)    GFR, Estimated 47 (*)    All other components within normal limits  PROTIME-INR - Abnormal; Notable for the following components:   Prothrombin Time 23.6 (*)    INR 2.2 (*)    All other components within normal limits  BRAIN NATRIURETIC PEPTIDE - Abnormal; Notable for the following components:   B Natriuretic Peptide 286.3 (*)    All other components within normal limits  TROPONIN I (HIGH SENSITIVITY) - Abnormal; Notable for the following components:   Troponin I (High Sensitivity) 18 (*)    All other components within normal limits  CBC WITH DIFFERENTIAL/PLATELET  TROPONIN I (HIGH SENSITIVITY)    EKG EKG Interpretation  Date/Time:  Tuesday March 10 2020 03:51:33 EST Ventricular Rate:  81 PR Interval:    QRS Duration: 90 QT Interval:  356 QTC Calculation: 413 R Axis:   -79 Text Interpretation: Atrial flutter with variable A-V block Left axis deviation Pulmonary disease  pattern RSR' or QR pattern in V1 suggests right ventricular conduction delay Septal infarct , age undetermined Abnormal ECG No significant change since last tracing Confirmed by Ripley Fraise [phone]) on 03/10/2020 4:07:37 AM   Radiology DG Chest 2 View  Result Date: 03/10/2020 CLINICAL DATA:  Chest pain, atrial fibrillation EXAM: CHEST - 2 VIEW COMPARISON:  02/01/2020 FINDINGS: Lung volumes are small and pulmonary insufflation has diminished since prior examination. There have developed at least 2 nodules within the right perihilar region and right lung base, new since prior examination. Given the relatively rapid development, and infectious or inflammatory condition is considered most likely. Minimal retrocardiac atelectasis or infiltrate. No pneumothorax or pleural effusion. Cardiac size is mildly enlarged. Pulmonary vascularity is normal. No acute bone abnormality. IMPRESSION: Interval development of multiple pulmonary nodules within the right lung, likely infectious or inflammatory. These could be better assessed with CT examination if indicated. Electronically Signed   By: Fidela Salisbury MD   On: 03/10/2020 04:38    Procedures Procedures (including critical care time)  Medications Ordered in ED Medications - No data to display  ED Course  I have reviewed the triage vital signs and the nursing notes.  Pertinent labs & imaging results that were available during my care of the patient were reviewed by me and considered in my medical decision making (see chart for details).    MDM Rules/Calculators/A&P  86 y.o. F here with chest pain.  Left sided with radiation into left shoulder/upper back, and down left arm.  No SOB, diaphoresis, nausea, vomiting.  She is well in appearance, NAD.  EKG without any acute ischemic changes.  Labs grossly reassuring.  INR therapeutic.  Troponin is equivocal at 18.  Chest x-ray with findings of pulmonary nodules in the right lung, these were also seen on  CTA  of the chest from 02/01/20.  Patient has history of appendiceal cancer but is no pursuing any further work-up from this.  5:58 AM Spoke with patient's cardiologist, Dr. Virgina Jock-- cycle troponin, if remaining stable can discharge home with office follow-up.  Care will be signed out to oncoming provider pending delta trop and reassessment.  Final Clinical Impression(s) / ED Diagnoses Final diagnoses:  Chest pain in adult    Rx / DC Orders ED Discharge Orders    None       Larene Pickett, PA-C 03/10/20 0349    Ripley Fraise, MD 03/10/20 [phone]

## 2020-03-10 NOTE — ED Provider Notes (Signed)
Care of the patient was assumed from L. Sanders PA-C at 630; see this 35 note for complete history of present illness, review of systems, and physical exam.  Briefly, the patient is a 86 y.o. female who presented to the ED with chest pain that was on her left side and radiates her left shoulder.  Followed by cardiology, Dr. Virgina Jock.  History significant for A. fib on Coumadin, CHF, hypertension, thyroid disease.  Work-up today grossly negative, she was in normal  appearance according to previous provider.  EKG without any acute ischemic changes, INR therapeutic.  Troponin 18, which appears to be patient's baseline.  Plan at time of handoff:  Cycle trop, if negative go home, cardiologist on board.    Physical Exam  BP 130/70   Pulse 74   Temp 98.2 F (36.8 C)   Resp 17   Ht 5' (1.524 m)   Wt 70.3 kg   SpO2 91%   BMI 30.27 kg/m   Physical Exam Constitutional:      General: She is not in acute distress.    Appearance: Normal appearance. She is not ill-appearing, toxic-appearing or diaphoretic.  HENT:     Head: Normocephalic and atraumatic.  Eyes:     Extraocular Movements: Extraocular movements intact.     Pupils: Pupils are equal, round, and reactive to light.  Cardiovascular:     Rate and Rhythm: Normal rate.     Pulses: Normal pulses.  Pulmonary:     Effort: Pulmonary effort is normal.  Musculoskeletal:        General: Normal range of motion.  Skin:    General: Skin is warm and dry.     Capillary Refill: Capillary refill takes less than 2 seconds.  Neurological:     Mental Status: She is alert.     ED Course/Procedures     Procedures  MDM    86 year old coming in with chest pain, work-up today reassuring.  First troponin 18. Previous provider spoke to cardiologist who recommended cycling troponin, if no delta troponin increase, can discharge home with office follow-up.  Patient was recently discharged from the hospital about a month ago, at that time did  receive echo, did also receive echo 3 days ago.  Second troponin 19.  Patient to be discharged at this time, patient will follow up with Dr. Virgina Jock, patient agreeable.  Patient has been assessed by Dr. Christy Gentles.   Doubt need for further emergent work up at this time. I explained the diagnosis and have given explicit precautions to return to the ER including for any other new or worsening symptoms. The patient understands and accepts the medical plan as it's been dictated and I have answered their questions. Discharge instructions concerning home care and prescriptions have been given. The patient is STABLE and is discharged to home in good condition.          Alfredia Client, PA-C 03/10/20 [id number]    Charlesetta Shanks, MD 03/10/20 [phone]

## 2020-03-10 NOTE — ED Provider Notes (Signed)
Patient seen/examined in the Emergency Department in conjunction with Advanced Practice Provider [place] Patient reports left chest pain that radiates into her left arm Exam : awake/alert, appears uncomfortable, no murmur/rubs/gallops on cardiac exam.  Pt is in atrial flutter, rate controlled Plan: will need cardiology consultation once labs result    Ripley Fraise, MD 03/10/20 [phone]

## 2020-03-11 ENCOUNTER — Ambulatory Visit: Payer: Medicare Other | Admitting: Cardiology

## 2020-03-11 ENCOUNTER — Other Ambulatory Visit: Payer: Self-pay

## 2020-03-11 ENCOUNTER — Encounter: Payer: Self-pay | Admitting: Cardiology

## 2020-03-11 VITALS — BP 155/81 | HR 73 | Resp 16 | Ht 60.0 in | Wt 157.0 lb

## 2020-03-11 DIAGNOSIS — I4821 Permanent atrial fibrillation: Secondary | ICD-10-CM

## 2020-03-11 DIAGNOSIS — I5020 Unspecified systolic (congestive) heart failure: Secondary | ICD-10-CM

## 2020-03-11 MED ORDER — NITROGLYCERIN 0.4 MG SL SUBL: 0.4000 mg | SUBLINGUAL_TABLET | SUBLINGUAL | 3 refills | Status: AC | PRN

## 2020-03-11 NOTE — Progress Notes (Signed)
Patient referred by [name], MD for atrial fibrillation  Subjective:   Ashley Dunn, female    DOB: 08/14/1925, 86 y.o.   MRN: [id number]   Chief Complaint  Patient presents with   Permanent atrial fibrillation [facility])   Follow-up   Results    HPI  86 y.o. Caucasian female with hyperthyroidism, controlled hypertension, persistent atrial flutter/fibrillation,appendix carcinoma, admitted with acute on chronic systolic/diastolic heart failure  Patient was hospitalized in 01/2020 with complaints of chest pain.  Pain was pleuritic and positional.  Work-up with CT chest showed small pericardial effusion, not appreciated on echocardiogram.  ESR and CRP were both elevated.  She was treated for possible pericarditis with IV steroid. F/u echocardiogram did not show pericarditis, but showed EF of 35-40% in the setting of Afib.   Patient had another ED visit in 02/2020 with chest pain. Patient described pain as sharp, initially started in left side of her chest, but then settled on left shoulder and arm. This did not improve with SL NTG, and eventually resolved on its own. Pain would improve on laying back. Chest Xray showed interval development of pulmonary nodules, new since previous chest Cray on 02/01/2020. Trop HS was flat at 18,19. BNP was mildly elevated at 286.   Patient is doing well today and denies any complaints.    Current Outpatient Medications on File Prior to Visit  Medication Sig Dispense Refill   acetaminophen (TYLENOL) 500 MG tablet Take 2 tablets (1,000 mg total) by mouth every 6 (six) hours as needed for mild pain or fever.     Ascorbic Acid (VITAMIN C) 100 MG tablet Take 100 mg by mouth daily.     B Complex Vitamins (VITAMIN-B COMPLEX PO) Take by mouth daily.     Calcium 150 MG TABS Take 150 mg by mouth daily.     CALCIUM PO Take 1 tablet by mouth in the morning, at noon, in the evening, and at bedtime.     Cholecalciferol (VITAMIN D3) 10 MCG (400 UNIT)  CAPS Take 400 Units by mouth daily.     furosemide (LASIX) 20 MG tablet Take 20 mg by mouth.     Garlic 770 MG TABS Take 100 mg by mouth daily.     lactobacillus acidophilus (BACID) TABS tablet Take 1 tablet by mouth daily with breakfast. 1 hr before breakfast and 2 hours after meds     LINOLEIC ACID-SUNFLOWER OIL PO Take 1 capsule by mouth daily.     magnesium oxide (MAG-OX) 400 MG tablet Take 400 mg by mouth in the morning, at noon, and at bedtime.     metoprolol succinate (TOPROL-XL) 50 MG 24 hr tablet Take 1 tablet (50 mg total) by mouth daily. Take with or immediately following a meal. 30 tablet 1   Omega-3 Fatty Acids (FISH OIL) 1000 MG CAPS Take 1,000 mg by mouth daily.     thyroid (ARMOUR THYROID) 120 MG tablet Take 1 tablet (120 mg total) by mouth daily before breakfast. 90 tablet 0   Turmeric (QC TUMERIC COMPLEX) 500 MG CAPS Take 500 mg by mouth daily.     vitamin E 1000 UNIT capsule Take 1,000 Units by mouth daily.     warfarin (COUMADIN) 5 MG tablet Take 1 tablet (5 mg total) by mouth daily. Refer to most recent anticoagulation note for most accurate dosing instructions. 90 tablet 3   No current facility-administered medications on file prior to visit.    Cardiovascular studies:  Chest Xray 03/10/2020:  Interval development of multiple pulmonary nodules within the right lung, likely infectious or inflammatory. These could be better assessed with CT examination if indicated.  CXR 02/01/2020: No active cardiopulmonary disease.  Cardiomegaly unchanged.   Echocardiogram 03/02/2020:  Moderately depressed LV systolic function with visual EF 35-40%.  Hypokinetic global wall motion. Left ventricle cavity is normal in size.  Unable to evaluate diastolic function due to atrial fibrillation. Normal  LAP.  Left atrial cavity is moderately dilated.  Right ventricle cavity is normal in size. Gross mildly reduced right  ventricular function.  Mild (Grade I) mitral  regurgitation.  Moderate tricuspid regurgitation.  Compared to prior study dated 06/28/2019; LVEF was 45-50% and now 35-40%  otherwise no significant changes.   EKG 02/07/2020: Atrial fibrillation 99 bpm Low voltage precordial leads Left anterior fascicular block Cannot exclude old inferior and anterior infarct  Frequent PVC  EKG 02/27/2019: Atypical atrial flutter with variable AV conduction, ventricular rate 96 bpm. Nonspecific ST depression  -Nondiagnostic.   Recent labs: 03/10/2020: Glucose 122, BUN/Cr 26/1.1. EGFR 47. Na/K 137/3.9. Albumin 2.6. Rest of the CMP normal H/H 13.8/43. MCV 97. Platelets 240 BNP 286 Trop HS 18, 19  02/02/2020: Glucose 151, BUN/Cr 43/1.4. EGFR 33. Na/K 138/4.0.  H/H 15/47. MCV 96. Platelets 167 ESR 52, CRP 22, both elevated INR 1.6 TSH 0.9 normal  10/23/2019: Glucose 92, BUN/Cr 46/1.03. EGFR 47. Na/K 144/4.2.  H/H 12.6/38.6. MCV 98. Platelets 152 INR 1.8 TSH 1.4 normal BNP 475 (10/19/2019)  01/2019: Chol 165, TG 67, HDL 48, LDL 104  05/29/2019: BNP 345  04/29/2019: TSH 0.043, free T4 1.5   Review of Systems  Cardiovascular: Negative for chest pain, dyspnea on exertion, leg swelling, palpitations and syncope.        Vitals:   03/11/20 1108  BP: (!) 155/81  Pulse: 73  Resp: 16  SpO2: 93%     Body mass index is 30.66 kg/m. Filed Weights   03/11/20 1108  Weight: 157 lb (71.2 kg)     Objective:   Physical Exam Vitals and nursing note reviewed.  Constitutional:      Appearance: She is well-developed.  Neck:     Vascular: No JVD.  Cardiovascular:     Rate and Rhythm: Normal rate. Rhythm irregularly irregular.     Pulses: Intact distal pulses.     Heart sounds: No murmur heard.      Comments: Bilateral LE varicocities Pulmonary:     Effort: Pulmonary effort is normal.     Breath sounds: Normal breath sounds. No wheezing or rales.  Musculoskeletal:     Right lower leg: No edema.     Left lower leg: No edema.          Assessment & Recommendations:    86 y.o. Caucasian female with hyperthyroidism, controlled hypertension, persistent atrial flutter/fibrillation,appendix carcinoma, admitted with acute on chronic systolic/diastolic heart failure  Chest pain: Has features of both typical and atypical angina symptoms. Other possible etiology is pulmonary nodules. She wants to pursue conservative management and does not want to be invasive and aggressive management of either possible CAD, or meds from her adenocarcinoma. She also does not want to take additional daily medications. We mutually agreed on using SL NTG, in stead.   HFrEF: Clinically euvolumic, Continue lasix 20 mg daily.  She would prefer not starting any new medications.  F/u in 4 weeks  [place], MD [facility] Pager: [phone] Office: [phone]

## 2020-03-13 ENCOUNTER — Ambulatory Visit: Payer: Medicare Other | Admitting: Cardiology

## 2020-03-26 ENCOUNTER — Other Ambulatory Visit: Payer: Self-pay

## 2020-03-26 MED ORDER — FUROSEMIDE 20 MG PO TABS
20.0000 mg | ORAL_TABLET | Freq: Every day | ORAL | 1 refills | Status: DC
Start: 2020-03-26 — End: 2020-06-02

## 2020-04-06 ENCOUNTER — Ambulatory Visit: Payer: Medicare Other

## 2020-04-10 ENCOUNTER — Encounter: Payer: Self-pay | Admitting: Cardiology

## 2020-04-10 ENCOUNTER — Other Ambulatory Visit: Payer: Self-pay

## 2020-04-10 ENCOUNTER — Ambulatory Visit: Payer: Medicare Other | Admitting: Cardiology

## 2020-04-10 VITALS — BP 155/82 | HR 72 | Resp 16 | Ht 60.0 in | Wt 158.2 lb

## 2020-04-10 DIAGNOSIS — Z5181 Encounter for therapeutic drug level monitoring: Secondary | ICD-10-CM

## 2020-04-10 DIAGNOSIS — I480 Paroxysmal atrial fibrillation: Secondary | ICD-10-CM

## 2020-04-10 DIAGNOSIS — Z7901 Long term (current) use of anticoagulants: Secondary | ICD-10-CM

## 2020-04-10 LAB — POCT INR: INR: 2.5 (ref 2.0–3.0)

## 2020-04-10 NOTE — Patient Instructions (Signed)
INR at goal. Continue weekly dose of 5 mg everyday. Recheck INR in 6 weeks

## 2020-04-10 NOTE — Progress Notes (Signed)
  Patient referred by Wile, Laura H, MD for atrial fibrillation  Subjective:   Ashley Dunn, female    DOB: 09/20/1925, 86 y.o.   MRN: [id number]   Chief Complaint  Patient presents with  . Chest Pain  . Follow-up    4 weeks     HPI  86 y.o. Caucasian female with hyperthyroidism, controlled hypertension, persistent atrial flutter/fibrillation,chronic systolic/diastolic heart failure  Patient is doing well, denies chest pain, shortness of breath, palpitations, leg edema, orthopnea, PND, TIA/syncope.  She is not vaccinated, and states her immune system is "very strong" and she does not need vaccine.   Blood pressure is elevated, but she is unwilling to start any new medication at this time.    Current Outpatient Medications on File Prior to Visit  Medication Sig Dispense Refill  . acetaminophen (TYLENOL) 500 MG tablet Take 2 tablets (1,000 mg total) by mouth every 6 (six) hours as needed for mild pain or fever.    . Ascorbic Acid (VITAMIN C) 100 MG tablet Take 100 mg by mouth daily.    . B Complex Vitamins (VITAMIN-B COMPLEX PO) Take by mouth daily.    . Calcium 150 MG TABS Take 150 mg by mouth daily.    . CALCIUM PO Take 1 tablet by mouth in the morning, at noon, in the evening, and at bedtime.    . Cholecalciferol (VITAMIN D3) 10 MCG (400 UNIT) CAPS Take 400 Units by mouth daily.    . furosemide (LASIX) 20 MG tablet Take 1 tablet (20 mg total) by mouth daily. 30 tablet 1  . Garlic 100 MG TABS Take 100 mg by mouth daily.    . lactobacillus acidophilus (BACID) TABS tablet Take 1 tablet by mouth daily with breakfast. 1 hr before breakfast and 2 hours after meds    . LINOLEIC ACID-SUNFLOWER OIL PO Take 1 capsule by mouth daily.    . magnesium oxide (MAG-OX) 400 MG tablet Take 400 mg by mouth in the morning, at noon, and at bedtime.    . metoprolol succinate (TOPROL-XL) 50 MG 24 hr tablet Take 1 tablet (50 mg total) by mouth daily. Take with or immediately following a meal. 30  tablet 1  . nitroGLYCERIN (NITROSTAT) 0.4 MG SL tablet Place 1 tablet (0.4 mg total) under the tongue every 5 (five) minutes as needed for chest pain. 30 tablet 3  . Omega-3 Fatty Acids (FISH OIL) 1000 MG CAPS Take 1,000 mg by mouth daily.    . thyroid (ARMOUR THYROID) 120 MG tablet Take 1 tablet (120 mg total) by mouth daily before breakfast. 90 tablet 0  . Turmeric (QC TUMERIC COMPLEX) 500 MG CAPS Take 500 mg by mouth daily.    . vitamin E 1000 UNIT capsule Take 1,000 Units by mouth daily.    . warfarin (COUMADIN) 5 MG tablet Take 1 tablet (5 mg total) by mouth daily. Refer to most recent anticoagulation note for most accurate dosing instructions. 90 tablet 3   No current facility-administered medications on file prior to visit.    Cardiovascular studies:  Chest Xray 03/10/2020: Interval development of multiple pulmonary nodules within the right lung, likely infectious or inflammatory. These could be better assessed with CT examination if indicated.  CXR 02/01/2020: No active cardiopulmonary disease.  Cardiomegaly unchanged.   Echocardiogram 03/02/2020:  Moderately depressed LV systolic function with visual EF 35-40%.  Hypokinetic global wall motion. Left ventricle cavity is normal in size.  Unable to evaluate diastolic function due to atrial fibrillation.   Normal  LAP.  Left atrial cavity is moderately dilated.  Right ventricle cavity is normal in size. Gross mildly reduced right  ventricular function.  Mild (Grade I) mitral regurgitation.  Moderate tricuspid regurgitation.  Compared to prior study dated 06/28/2019; LVEF was 45-50% and now 35-40%  otherwise no significant changes.   EKG 02/07/2020: Atrial fibrillation 99 bpm Low voltage precordial leads Left anterior fascicular block Cannot exclude old inferior and anterior infarct  Frequent PVC  EKG 02/27/2019: Atypical atrial flutter with variable AV conduction, ventricular rate 96 bpm. Nonspecific ST depression   -Nondiagnostic.   Recent labs: 03/10/2020: Glucose 122, BUN/Cr 26/1.1. EGFR 47. Na/K 137/3.9. Albumin 2.6. Rest of the CMP normal H/H 13.8/43. MCV 97. Platelets 240 BNP 286 Trop HS 18, 19  02/02/2020: Glucose 151, BUN/Cr 43/1.4. EGFR 33. Na/K 138/4.0.  H/H 15/47. MCV 96. Platelets 167 ESR 52, CRP 22, both elevated INR 1.6 TSH 0.9 normal  10/23/2019: Glucose 92, BUN/Cr 46/1.03. EGFR 47. Na/K 144/4.2.  H/H 12.6/38.6. MCV 98. Platelets 152 INR 1.8 TSH 1.4 normal BNP 475 (10/19/2019)  01/2019: Chol 165, TG 67, HDL 48, LDL 104  05/29/2019: BNP 345  04/29/2019: TSH 0.043, free T4 1.5   Review of Systems  Cardiovascular: Negative for chest pain, dyspnea on exertion, leg swelling, palpitations and syncope.        Vitals:   04/10/20 1357 04/10/20 1423  BP: (!) 175/84 (!) 155/82  Pulse: 72 72  Resp:    SpO2: 94%      Body mass index is 30.9 kg/m. Filed Weights   04/10/20 1348  Weight: 158 lb 3.2 oz (71.8 kg)     Objective:   Physical Exam Vitals and nursing note reviewed.  Constitutional:      Appearance: She is well-developed.  Neck:     Vascular: No JVD.  Cardiovascular:     Rate and Rhythm: Normal rate. Rhythm irregularly irregular.     Pulses: Intact distal pulses.     Heart sounds: No murmur heard.     Comments: Bilateral LE varicocities Pulmonary:     Effort: Pulmonary effort is normal.     Breath sounds: Normal breath sounds. No wheezing or rales.  Musculoskeletal:     Right lower leg: No edema.     Left lower leg: No edema.         Assessment & Recommendations:    86 y.o. Caucasian female with hyperthyroidism, controlled hypertension, persistent atrial flutter/fibrillation,appendix carcinoma, admitted with acute on chronic systolic/diastolic heart failure  Chest pain: No recurrence at his time. She wants to pursue conservative management and does not want to be invasive and aggressive management of either possible CAD, or mets from her  adenocarcinoma. She also does not want to take additional daily medications. We mutually agreed on using SL NTG, in stead.   HFrEF: Clinically euvolumic Continue lasix 20 mg daily.  She would prefer not starting any new medications.  Hypertension: BP elevated today. She does not want to add any medication at this time. Recommend regular home monitoring.   Persistent Afib: Goal: 2.0-3.0 Indication: Afib Today's INR: 2.5 Current dose:5 mg daily  New dose: Same  Next INR check: No change   F/u in 3 months  Alzina Golda Esther Hardy, MD [facility] [place] Pager: [phone] Office: [phone]

## 2020-05-14 ENCOUNTER — Ambulatory Visit: Payer: Medicare Other | Admitting: Cardiology

## 2020-05-29 ENCOUNTER — Telehealth: Payer: Self-pay

## 2020-05-29 ENCOUNTER — Encounter: Payer: Self-pay | Admitting: Cardiology

## 2020-05-29 ENCOUNTER — Other Ambulatory Visit: Payer: Self-pay

## 2020-05-29 ENCOUNTER — Ambulatory Visit: Payer: Medicare Other | Admitting: Cardiology

## 2020-05-29 VITALS — BP 146/87 | HR 83 | Temp 97.2°F | Resp 17 | Ht 60.0 in | Wt 149.6 lb

## 2020-05-29 DIAGNOSIS — R911 Solitary pulmonary nodule: Secondary | ICD-10-CM

## 2020-05-29 DIAGNOSIS — I5020 Unspecified systolic (congestive) heart failure: Secondary | ICD-10-CM

## 2020-05-29 DIAGNOSIS — I4821 Permanent atrial fibrillation: Secondary | ICD-10-CM

## 2020-05-29 DIAGNOSIS — Z7901 Long term (current) use of anticoagulants: Secondary | ICD-10-CM

## 2020-05-29 DIAGNOSIS — Z5181 Encounter for therapeutic drug level monitoring: Secondary | ICD-10-CM

## 2020-05-29 DIAGNOSIS — I480 Paroxysmal atrial fibrillation: Secondary | ICD-10-CM

## 2020-05-29 DIAGNOSIS — I5032 Chronic diastolic (congestive) heart failure: Secondary | ICD-10-CM

## 2020-05-29 LAB — POCT INR: INR: 4.5 — AB (ref 2.0–3.0)

## 2020-05-29 NOTE — Telephone Encounter (Signed)
error 

## 2020-05-29 NOTE — Progress Notes (Signed)
Patient referred by [name], MD for atrial fibrillation  Subjective:   Ashley Dunn, female    DOB: 1925-09-15, 87 y.o.   MRN: [id number]   Chief Complaint  Patient presents with  . Shortness of Breath  . Follow-up    HPI  87 y.o. Caucasian female with hyperthyroidism, controlled hypertension, persistent atrial flutter/fibrillation,chronic systolic/diastolic heart failure, appendical carcinomas/pappendectomy (2021)  Patient has had orthopnea, as well as random episodes of shortness of breath occurring a trest. Shortness of breath is not significantly worse with walking. She has pain in shoulders, back, chest pain.   Patient had known COVID exposure 3 weeks ago. She developed shortness of breath and congestion, but tested negative a week later.   I reviewed CT chest from 02/2020 again with the patient and daughter, details below.    Current Outpatient Medications on File Prior to Visit  Medication Sig Dispense Refill  . acetaminophen (TYLENOL) 500 MG tablet Take 2 tablets (1,000 mg total) by mouth every 6 (six) hours as needed for mild pain or fever.    . Ascorbic Acid (VITAMIN C) 100 MG tablet Take 100 mg by mouth daily.    . B Complex Vitamins (VITAMIN-B COMPLEX PO) Take by mouth daily.    . Calcium 150 MG TABS Take 150 mg by mouth daily.    Marland Kitchen CALCIUM PO Take 1 tablet by mouth in the morning, at noon, in the evening, and at bedtime.    . Cholecalciferol (VITAMIN D3) 10 MCG (400 UNIT) CAPS Take 400 Units by mouth daily.    . furosemide (LASIX) 20 MG tablet Take 1 tablet (20 mg total) by mouth daily. 30 tablet 1  . Garlic 025 MG TABS Take 100 mg by mouth daily.    Marland Kitchen lactobacillus acidophilus (BACID) TABS tablet Take 1 tablet by mouth daily with breakfast. 1 hr before breakfast and 2 hours after meds    . LINOLEIC ACID-SUNFLOWER OIL PO Take 1 capsule by mouth daily.    . magnesium oxide (MAG-OX) 400 MG tablet Take 400 mg by mouth in the morning, at noon, and at  bedtime.    . metoprolol succinate (TOPROL-XL) 50 MG 24 hr tablet Take 1 tablet (50 mg total) by mouth daily. Take with or immediately following a meal. 30 tablet 1  . nitroGLYCERIN (NITROSTAT) 0.4 MG SL tablet Place 1 tablet (0.4 mg total) under the tongue every 5 (five) minutes as needed for chest pain. 30 tablet 3  . Omega-3 Fatty Acids (FISH OIL) 1000 MG CAPS Take 1,000 mg by mouth daily.    Marland Kitchen thyroid (ARMOUR THYROID) 120 MG tablet Take 1 tablet (120 mg total) by mouth daily before breakfast. 90 tablet 0  . Turmeric 500 MG CAPS Take 500 mg by mouth daily.    . vitamin E 1000 UNIT capsule Take 1,000 Units by mouth daily.    Marland Kitchen warfarin (COUMADIN) 5 MG tablet Take 1 tablet (5 mg total) by mouth daily. Refer to most recent anticoagulation note for most accurate dosing instructions. 90 tablet 3   No current facility-administered medications on file prior to visit.    Cardiovascular studies:  Chest Xray 03/10/2020: Interval development of multiple pulmonary nodules within the right lung, likely infectious or inflammatory. These could be better assessed with CT examination if indicated.  CXR 02/01/2020: No active cardiopulmonary disease.  Cardiomegaly unchanged.  CTA chest 02/01/2020: Chest CTA impression: 1. No acute cardiopulmonary disease. Specifically, no evidence of thoracic aortic aneurysm or dissection on  this nongated examination. No evidence of central pulmonary embolism. 2. Cardiomegaly with small pericardial effusion, increased in size compared to the 10/19/2019 examination - further evaluation cardiac echo could be performed as clinically indicated. 3. Coronary artery calcifications. Aortic Atherosclerosis (ICD10-I70.0). 4. Enlargement right lower lobe sub pleural masslike opacity currently measuring 3.3 cm, previously 2.6 cm, when compared to abdominal CT 05/2019. As malignancy is not excluded on the basis of this examination, further evaluation with PET-CT could performed  as clinically indicated. 5. Redemonstrated nonspecific prominent though non pathologically mediastinal and bilateral hilar lymph nodes, unchanged to slightly improved compared to the 10/19/2018 examination.  Abdomen and pelvic CTA impression:  1. Large amount of mixed calcified and noncalcified atherosclerotic plaque throughout tortuous but normal caliber abdominal aorta, not resulting in hemodynamically significant stenosis. 2. Post appendectomy and removal of percutaneous drainage catheter with residual/recurrent approximately 4.9 cm apparent fluid collection/phlegmon within the right lower abdominal quadrant and interval increase in size of presumably reactive lymph node within the root of the right lower quadrant abdominal mesentery, currently measuring 1.7 cm, previously 1.2 cm. No evidence of enteric obstruction. 3. Cholelithiasis with distension of the gallbladder but without gallbladder wall thickening or pericholecystic fluid. If there is clinical concern for acute cholecystitis, further evaluation with right upper quadrant abdominal ultrasound could be performed as clinically indicated. 4. Nodularity hepatic contour as could be seen in the setting of early cirrhotic change. Correlation with LFTs is advised. 5. Unchanged moderate (approximately 25%) compression deformity involving the T12 vertebral body. 6. Mild-to-moderate scoliotic curvature of the thoracolumbar spine associated multilevel moderate severe DDD, worse at L1-L2 and L4-L5.   Echocardiogram 03/02/2020:  Moderately depressed LV systolic function with visual EF 35-40%.  Hypokinetic global wall motion. Left ventricle cavity is normal in size.  Unable to evaluate diastolic function due to atrial fibrillation. Normal  LAP.  Left atrial cavity is moderately dilated.  Right ventricle cavity is normal in size. Gross mildly reduced right  ventricular function.  Mild (Grade I) mitral regurgitation.  Moderate  tricuspid regurgitation.  Compared to prior study dated 06/28/2019; LVEF was 45-50% and now 35-40%  otherwise no significant changes.   EKG 02/07/2020: Atrial fibrillation 99 bpm Low voltage precordial leads Left anterior fascicular block Cannot exclude old inferior and anterior infarct  Frequent PVC  EKG 02/27/2019: Atypical atrial flutter with variable AV conduction, ventricular rate 96 bpm. Nonspecific ST depression  -Nondiagnostic.   Recent labs: 03/10/2020: Glucose 122, BUN/Cr 26/1.1. EGFR 47. Na/K 137/3.9. Albumin 2.6. Rest of the CMP normal H/H 13.8/43. MCV 97. Platelets 240 BNP 286 Trop HS 18, 19  02/02/2020: Glucose 151, BUN/Cr 43/1.4. EGFR 33. Na/K 138/4.0.  H/H 15/47. MCV 96. Platelets 167 ESR 52, CRP 22, both elevated INR 1.6 TSH 0.9 normal  10/23/2019: Glucose 92, BUN/Cr 46/1.03. EGFR 47. Na/K 144/4.2.  H/H 12.6/38.6. MCV 98. Platelets 152 INR 1.8 TSH 1.4 normal BNP 475 (10/19/2019)  01/2019: Chol 165, TG 67, HDL 48, LDL 104  05/29/2019: BNP 345  04/29/2019: TSH 0.043, free T4 1.5   Review of Systems  Cardiovascular: Positive for orthopnea. Negative for chest pain, dyspnea on exertion, leg swelling, palpitations and syncope.  Respiratory: Positive for shortness of breath.         Vitals:   05/29/20 1100  BP: (!) 146/87  Pulse: 83  Resp: 17  Temp: (!) 97.2 F (36.2 C)  SpO2: 93%    Body mass index is 29.22 kg/m. Filed Weights   05/29/20 1100  Weight: 149 lb  9.6 oz (67.9 kg)     Objective:   Physical Exam Vitals and nursing note reviewed.  Constitutional:      General: She is not in acute distress. Neck:     Vascular: No JVD.  Cardiovascular:     Rate and Rhythm: Normal rate and regular rhythm.     Heart sounds: Normal heart sounds. No murmur heard.   Pulmonary:     Effort: Pulmonary effort is normal.     Breath sounds: Normal breath sounds. No wheezing or rales.  Musculoskeletal:     Right lower leg: No edema.     Left lower  leg: No edema.         Assessment & Recommendations:    87 y.o. Caucasian female with hyperthyroidism, controlled hypertension, persistent atrial flutter/fibrillation,chronic systolic/diastolic heart failure, appendical carcinomas/pappendectomy (2021)  Shortness of breath: Likely multifactorial, including HFpEF-albeit without any significant physical exam signs of heart failure- as well as pulmonary nodules. I have increased her lasix to 40 mg. I had a long conversation with patient and her daughter. In the past, she has opted to not pursue further workup for these pulmonary nodules, knowing the possibility of malignancy. On the same lines., she had opted to be DNR. However, with worsening symptoms, she is in two minds. I have encouraged patient and daughter to have further discussion re: her wishes. In the meantime, I have referred her for pulmonary evaluation  Chest pain: No specific angina symptoms at this time.  HFpEF: Increase lasix to 40 mg daily.   Hypertension: BP mildly elevated. Hopefully increasing lasix dose would help.   Persistent Afib: Goal: 2.0-3.0 Indication: Afib Today's INR: 4.5 Current dose: 5 mg daily  New dose: Hold today. 2.5 mg tomorrow, then 5 mg daily Next INR check: 1 week  H/o COVID exposure: 3 weeks ago. Antigen test negative two weeks ago, No further test since then. I don't think testing at this point would add value.   F/u in 4 weeks  Time spent: 45 min  Spi[place], MD [facility] [place] Pager: [phone] Office: [phone]

## 2020-05-29 NOTE — Patient Instructions (Signed)
INR above goal. HOLD today and take 2.5 mg tomorrow, then continue weekly dose of 5 mg everyday. Recheck INR in 1 weeks

## 2020-06-02 ENCOUNTER — Other Ambulatory Visit: Payer: Self-pay

## 2020-06-02 MED ORDER — FUROSEMIDE 40 MG PO TABS: 40.0000 mg | ORAL_TABLET | Freq: Every day | ORAL | 1 refills | Status: AC

## 2020-06-05 ENCOUNTER — Ambulatory Visit: Payer: Medicare Other | Admitting: Pharmacist

## 2020-06-05 ENCOUNTER — Other Ambulatory Visit: Payer: Self-pay

## 2020-06-05 DIAGNOSIS — Z5181 Encounter for therapeutic drug level monitoring: Secondary | ICD-10-CM

## 2020-06-05 DIAGNOSIS — I480 Paroxysmal atrial fibrillation: Secondary | ICD-10-CM

## 2020-06-05 DIAGNOSIS — Z7901 Long term (current) use of anticoagulants: Secondary | ICD-10-CM

## 2020-06-05 LAB — POCT INR: INR: 3.1 — AB (ref 2.0–3.0)

## 2020-06-05 NOTE — Progress Notes (Signed)
Anticoagulation Management Ashley Dunn is a 87 y.o. female who reports to the clinic for monitoring of warfarin treatment.    Indication: atrial fibrillation CHA2DS2 Vasc Score 5 (age >45, Female, CHF hx, HTN hx), HAS-BLED 1 (Age >32)   Duration: indefinite Supervising physician: [facility] Visit History:  Patient does not report signs/symptoms of bleeding or thromboembolism   Other recent changes: No changes in diet, medications, lifestyle. Reports to remain consistent with changes in her diet and limiting salt intake. Reports that SOB have improved since lasix dose  Anticoagulation Episode Summary    Current INR goal:  2.0-3.0  TTR:  55.2 % (1 y)  Next INR check:  06/25/2020  INR from last check:  3.1 (06/05/2020)  Weekly max warfarin dose:    Target end date:  Indefinite  INR check location:    Preferred lab:    Send INR reminders to:     Indications   Atrial fibrillation (HCC) [I48.91] Monitoring for long-term anticoagulant use [Z51.81 Z79.01]       Comments:          No Known Allergies  Current Outpatient Medications:  .  acetaminophen (TYLENOL) 500 MG tablet, Take 2 tablets (1,000 mg total) by mouth every 6 (six) hours as needed for mild pain or fever., Disp: , Rfl:  .  Ascorbic Acid (VITAMIN C) 100 MG tablet, Take 100 mg by mouth daily., Disp: , Rfl:  .  B Complex Vitamins (VITAMIN-B COMPLEX PO), Take by mouth daily., Disp: , Rfl:  .  Calcium 150 MG TABS, Take 150 mg by mouth daily., Disp: , Rfl:  .  CALCIUM PO, Take 1 tablet by mouth in the morning, at noon, in the evening, and at bedtime., Disp: , Rfl:  .  Cholecalciferol (VITAMIN D3) 10 MCG (400 UNIT) CAPS, Take 400 Units by mouth daily., Disp: , Rfl:  .  furosemide (LASIX) 40 MG tablet, Take 1 tablet (40 mg total) by mouth daily., Disp: 90 tablet, Rfl: 1 .  Garlic 867 MG TABS, Take 100 mg by mouth daily., Disp: , Rfl:  .  lactobacillus acidophilus (BACID) TABS tablet, Take 1 tablet  by mouth daily with breakfast. 1 hr before breakfast and 2 hours after meds, Disp: , Rfl:  .  LINOLEIC ACID-SUNFLOWER OIL PO, Take 1 capsule by mouth daily., Disp: , Rfl:  .  magnesium oxide (MAG-OX) 400 MG tablet, Take 400 mg by mouth in the morning, at noon, and at bedtime., Disp: , Rfl:  .  metoprolol succinate (TOPROL-XL) 50 MG 24 hr tablet, Take 1 tablet (50 mg total) by mouth daily. Take with or immediately following a meal., Disp: 30 tablet, Rfl: 1 .  nitroGLYCERIN (NITROSTAT) 0.4 MG SL tablet, Place 1 tablet (0.4 mg total) under the tongue every 5 (five) minutes as needed for chest pain., Disp: 30 tablet, Rfl: 3 .  Omega-3 Fatty Acids (FISH OIL) 1000 MG CAPS, Take 1,000 mg by mouth daily., Disp: , Rfl:  .  thyroid (ARMOUR THYROID) 120 MG tablet, Take 1 tablet (120 mg total) by mouth daily before breakfast., Disp: 90 tablet, Rfl: 0 .  Turmeric 500 MG CAPS, Take 500 mg by mouth daily., Disp: , Rfl:  .  vitamin E 1000 UNIT capsule, Take 1,000 Units by mouth daily., Disp: , Rfl:  .  Vitamin E 400 units TABS, Take 400 Units by mouth daily., Disp: , Rfl:  .  warfarin (COUMADIN) 5 MG tablet, Take 1 tablet (5 mg total)  by mouth daily. Refer to most recent anticoagulation note for most accurate dosing instructions., Disp: 90 tablet, Rfl: 3 Past Medical History:  Diagnosis Date  . Arthritis   . Atrial fibrillation ([place])   . Cataract   . CHF (congestive heart failure) ([place])   . Hypertension   . Osteoporosis   . Thyroid disease    ASSESSMENT  Recent Results: The most recent result is correlated with 32.5 mg per week:  Lab Results  Component Value Date   INR 3.1 (A) 06/05/2020   INR 4.5 (A) 05/29/2020   INR 2.5 04/10/2020    Anticoagulation Dosing: Description   INR above goal. Decrease weekly dose to 2.5 mg every Fri and 5 mg everyday. Recheck INR in 3 weeks      INR today: Supratherapeutic, slightly supratheraputic following recent dose adjustments. Pt continues to have poor  appetite and thus having lower than previous Vit K goals. Denies any complains of bleeding or bruising symptoms. Denies any other relevant changes in her diet, medications, or lifestyle. Will decrease weekly weekly dose and continue close monitoring to ensure INR returns to be therapeutic.   PLAN Weekly dose was decreased by 7.1% to 32.5 mg/week. Decrease weekly dose to 2.5 mg every Fri and 5 mg all other days. Recheck in 3 weeks  Patient Instructions  INR above goal. Decrease weekly dose to 2.5 mg every Fri and 5 mg everyday. Recheck INR in 3 weeks   Patient advised to contact clinic or seek medical attention if signs/symptoms of bleeding or thromboembolism occur.  Patient verbalized understanding by repeating back information and was advised to contact me if further medication-related questions arise.   Follow-up Return in about 20 days (around 06/25/2020).  [name], PharmD  15 minutes spent face-to-face with the patient during the encounter. 50% of time spent on education, including signs/sx bleeding and clotting, as well as food and drug interactions with warfarin. 50% of time was spent on fingerprick POC INR sample collection,processing, results determination, and documentation

## 2020-06-05 NOTE — Patient Instructions (Signed)
INR above goal. Decrease weekly dose to 2.5 mg every Fri and 5 mg everyday. Recheck INR in 3 weeks

## 2020-06-25 ENCOUNTER — Other Ambulatory Visit: Payer: Self-pay

## 2020-06-25 ENCOUNTER — Ambulatory Visit: Payer: Medicare Other | Admitting: Pharmacist

## 2020-06-25 DIAGNOSIS — Z5181 Encounter for therapeutic drug level monitoring: Secondary | ICD-10-CM

## 2020-06-25 DIAGNOSIS — I480 Paroxysmal atrial fibrillation: Secondary | ICD-10-CM

## 2020-06-25 DIAGNOSIS — Z7901 Long term (current) use of anticoagulants: Secondary | ICD-10-CM

## 2020-06-25 LAB — POCT INR: INR: 2.3 (ref 2.0–3.0)

## 2020-06-25 NOTE — Progress Notes (Signed)
Anticoagulation Management Ashley Dunn is a 87 y.o. female who reports to the clinic for monitoring of warfarin treatment.    Indication: atrial fibrillation CHA2DS2 Vasc Score 5 (age >47, Female, CHF hx, HTN hx), HAS-BLED 1 (Age >54)   Duration: indefinite Supervising physician: [facility] Visit History:  Patient does not report signs/symptoms of bleeding or thromboembolism   Other recent changes: No changes in diet, medications, lifestyle. Reports to remain consistent with changes in her diet and limiting salt intake. Reports that SOB have improved since lasix dose  Anticoagulation Episode Summary    Current INR goal:  2.0-3.0  TTR:  56.9 % (1.1 y)  Next INR check:  07/17/2020  INR from last check:  2.3 (06/25/2020)  Weekly max warfarin dose:    Target end date:  Indefinite  INR check location:    Preferred lab:    Send INR reminders to:     Indications   Atrial fibrillation (HCC) [I48.91] Monitoring for long-term anticoagulant use [Z51.81 Z79.01]       Comments:          No Known Allergies  Current Outpatient Medications:  .  acetaminophen (TYLENOL) 500 MG tablet, Take 2 tablets (1,000 mg total) by mouth every 6 (six) hours as needed for mild pain or fever., Disp: , Rfl:  .  Ascorbic Acid (VITAMIN C) 100 MG tablet, Take 100 mg by mouth daily., Disp: , Rfl:  .  B Complex Vitamins (VITAMIN-B COMPLEX PO), Take by mouth daily., Disp: , Rfl:  .  Calcium 150 MG TABS, Take 150 mg by mouth daily., Disp: , Rfl:  .  CALCIUM PO, Take 1 tablet by mouth in the morning, at noon, in the evening, and at bedtime., Disp: , Rfl:  .  Cholecalciferol (VITAMIN D3) 10 MCG (400 UNIT) CAPS, Take 400 Units by mouth daily., Disp: , Rfl:  .  furosemide (LASIX) 40 MG tablet, Take 1 tablet (40 mg total) by mouth daily., Disp: 90 tablet, Rfl: 1 .  Garlic 932 MG TABS, Take 100 mg by mouth daily., Disp: , Rfl:  .  lactobacillus acidophilus (BACID) TABS tablet, Take 1  tablet by mouth daily with breakfast. 1 hr before breakfast and 2 hours after meds, Disp: , Rfl:  .  LINOLEIC ACID-SUNFLOWER OIL PO, Take 1 capsule by mouth daily., Disp: , Rfl:  .  magnesium oxide (MAG-OX) 400 MG tablet, Take 400 mg by mouth in the morning, at noon, and at bedtime., Disp: , Rfl:  .  metoprolol succinate (TOPROL-XL) 50 MG 24 hr tablet, Take 1 tablet (50 mg total) by mouth daily. Take with or immediately following a meal., Disp: 30 tablet, Rfl: 1 .  nitroGLYCERIN (NITROSTAT) 0.4 MG SL tablet, Place 1 tablet (0.4 mg total) under the tongue every 5 (five) minutes as needed for chest pain., Disp: 30 tablet, Rfl: 3 .  Omega-3 Fatty Acids (FISH OIL) 1000 MG CAPS, Take 1,000 mg by mouth daily., Disp: , Rfl:  .  thyroid (ARMOUR THYROID) 120 MG tablet, Take 1 tablet (120 mg total) by mouth daily before breakfast., Disp: 90 tablet, Rfl: 0 .  Turmeric 500 MG CAPS, Take 500 mg by mouth daily., Disp: , Rfl:  .  vitamin E 1000 UNIT capsule, Take 1,000 Units by mouth daily., Disp: , Rfl:  .  Vitamin E 400 units TABS, Take 400 Units by mouth daily., Disp: , Rfl:  .  warfarin (COUMADIN) 5 MG tablet, Take 1 tablet (5 mg total)  by mouth daily. Refer to most recent anticoagulation note for most accurate dosing instructions., Disp: 90 tablet, Rfl: 3 Past Medical History:  Diagnosis Date  . Arthritis   . Atrial fibrillation ([place])   . Cataract   . CHF (congestive heart failure) ([place])   . Hypertension   . Osteoporosis   . Thyroid disease    ASSESSMENT  Recent Results: The most recent result is correlated with 32.5 mg per week:  Lab Results  Component Value Date   INR 2.3 06/25/2020   INR 3.1 (A) 06/05/2020   INR 4.5 (A) 05/29/2020    Anticoagulation Dosing: Description   INR at goal. Continue current weekly dose of 2.5 mg every Fri and 5 mg everyday. Recheck INR in 3 weeks      INR today: Therapeutic, following recent weekly dose decrease. Denies any complains of bleeding or bruising  symptoms. Denies any other relevant changes in her diet, medications, or lifestyle. Will continue current weekly dose and close monitoring.   PLAN Weekly dose was unchanged by 0% to 32.5 mg/week. Continue current weekly dose of 2.5 mg every Fri and 5 mg all other days. Recheck in 3 weeks  Patient Instructions  INR at goal. Continue current weekly dose of 2.5 mg every Fri and 5 mg everyday. Recheck INR in 3 weeks   Patient advised to contact clinic or seek medical attention if signs/symptoms of bleeding or thromboembolism occur.  Patient verbalized understanding by repeating back information and was advised to contact me if further medication-related questions arise.   Follow-up Return in about 22 days (around 07/17/2020).  [name], PharmD  15 minutes spent face-to-face with the patient during the encounter. 50% of time spent on education, including signs/sx bleeding and clotting, as well as food and drug interactions with warfarin. 50% of time was spent on fingerprick POC INR sample collection,processing, results determination, and documentation

## 2020-06-25 NOTE — Patient Instructions (Addendum)
INR at goal. Continue current weekly dose of 2.5 mg every Fri and 5 mg everyday. Recheck INR in 3 weeks

## 2020-07-16 ENCOUNTER — Ambulatory Visit: Payer: Medicare Other | Admitting: Cardiology

## 2020-07-17 ENCOUNTER — Other Ambulatory Visit: Payer: Self-pay

## 2020-07-17 ENCOUNTER — Ambulatory Visit: Payer: Medicare Other | Admitting: Cardiology

## 2020-07-17 ENCOUNTER — Encounter: Payer: Self-pay | Admitting: Cardiology

## 2020-07-17 VITALS — BP 138/75 | HR 88 | Temp 98.6°F | Resp 16 | Ht 60.0 in | Wt 159.8 lb

## 2020-07-17 DIAGNOSIS — I480 Paroxysmal atrial fibrillation: Secondary | ICD-10-CM

## 2020-07-17 DIAGNOSIS — Z5181 Encounter for therapeutic drug level monitoring: Secondary | ICD-10-CM

## 2020-07-17 DIAGNOSIS — I4821 Permanent atrial fibrillation: Secondary | ICD-10-CM

## 2020-07-17 DIAGNOSIS — Z7901 Long term (current) use of anticoagulants: Secondary | ICD-10-CM

## 2020-07-17 LAB — POCT INR: INR: 1.9 — AB (ref 2.0–3.0)

## 2020-07-17 NOTE — Progress Notes (Signed)
Patient referred by [name], MD for atrial fibrillation  Subjective:   Ashley Dunn, female    DOB: 1925-06-29, 87 y.o.   MRN: [id number]   Chief Complaint  Patient presents with  . Permanent atrial fibrillation   . Shortness of Breath  . Follow-up    3 months    HPI  87 y.o. Caucasian female with hyperthyroidism, controlled hypertension, persistent atrial flutter/fibrillation,chronic systolic/diastolic heart failure, appendical carcinomas/pappendectomy (2021)  Patient is here with her daughter today.  She has had improvement in her shortness of breath symptoms.  Negative has completely resolved.  Patient is moving back to [place] to be closer to her daughter's children.  She does not have a cardiologist designated there yet.   Current Outpatient Medications on File Prior to Visit  Medication Sig Dispense Refill  . acetaminophen (TYLENOL) 500 MG tablet Take 2 tablets (1,000 mg total) by mouth every 6 (six) hours as needed for mild pain or fever.    . Ascorbic Acid (VITAMIN C) 100 MG tablet Take 100 mg by mouth daily.    . B Complex Vitamins (VITAMIN-B COMPLEX PO) Take by mouth daily.    . Calcium 150 MG TABS Take 150 mg by mouth daily.    Marland Kitchen CALCIUM PO Take 1 tablet by mouth in the morning, at noon, in the evening, and at bedtime.    . Cholecalciferol (VITAMIN D3) 10 MCG (400 UNIT) CAPS Take 400 Units by mouth daily.    . furosemide (LASIX) 40 MG tablet Take 1 tablet (40 mg total) by mouth daily. 90 tablet 1  . Garlic 622 MG TABS Take 100 mg by mouth daily.    Marland Kitchen lactobacillus acidophilus (BACID) TABS tablet Take 1 tablet by mouth daily with breakfast. 1 hr before breakfast and 2 hours after meds    . LINOLEIC ACID-SUNFLOWER OIL PO Take 1 capsule by mouth daily.    . magnesium oxide (MAG-OX) 400 MG tablet Take 400 mg by mouth in the morning, at noon, and at bedtime.    . metoprolol succinate (TOPROL-XL) 50 MG 24 hr tablet Take 1 tablet (50 mg total) by mouth daily.  Take with or immediately following a meal. 30 tablet 1  . nitroGLYCERIN (NITROSTAT) 0.4 MG SL tablet Place 1 tablet (0.4 mg total) under the tongue every 5 (five) minutes as needed for chest pain. 30 tablet 3  . Omega-3 Fatty Acids (FISH OIL) 1000 MG CAPS Take 1,000 mg by mouth daily.    Marland Kitchen thyroid (ARMOUR THYROID) 120 MG tablet Take 1 tablet (120 mg total) by mouth daily before breakfast. 90 tablet 0  . Turmeric 500 MG CAPS Take 500 mg by mouth daily.    . vitamin E 1000 UNIT capsule Take 1,000 Units by mouth daily.    . Vitamin E 400 units TABS Take 400 Units by mouth daily.    Marland Kitchen warfarin (COUMADIN) 5 MG tablet Take 1 tablet (5 mg total) by mouth daily. Refer to most recent anticoagulation note for most accurate dosing instructions. 90 tablet 3   No current facility-administered medications on file prior to visit.    Cardiovascular studies:  Chest Xray 03/10/2020: Interval development of multiple pulmonary nodules within the right lung, likely infectious or inflammatory. These could be better assessed with CT examination if indicated.  CXR 02/01/2020: No active cardiopulmonary disease.  Cardiomegaly unchanged.  CTA chest 02/01/2020: Chest CTA impression: 1. No acute cardiopulmonary disease. Specifically, no evidence of thoracic aortic aneurysm or dissection on  this nongated examination. No evidence of central pulmonary embolism. 2. Cardiomegaly with small pericardial effusion, increased in size compared to the 10/19/2019 examination - further evaluation cardiac echo could be performed as clinically indicated. 3. Coronary artery calcifications. Aortic Atherosclerosis (ICD10-I70.0). 4. Enlargement right lower lobe sub pleural masslike opacity currently measuring 3.3 cm, previously 2.6 cm, when compared to abdominal CT 05/2019. As malignancy is not excluded on the basis of this examination, further evaluation with PET-CT could performed as clinically indicated. 5. Redemonstrated  nonspecific prominent though non pathologically mediastinal and bilateral hilar lymph nodes, unchanged to slightly improved compared to the 10/19/2018 examination.  Abdomen and pelvic CTA impression:  1. Large amount of mixed calcified and noncalcified atherosclerotic plaque throughout tortuous but normal caliber abdominal aorta, not resulting in hemodynamically significant stenosis. 2. Post appendectomy and removal of percutaneous drainage catheter with residual/recurrent approximately 4.9 cm apparent fluid collection/phlegmon within the right lower abdominal quadrant and interval increase in size of presumably reactive lymph node within the root of the right lower quadrant abdominal mesentery, currently measuring 1.7 cm, previously 1.2 cm. No evidence of enteric obstruction. 3. Cholelithiasis with distension of the gallbladder but without gallbladder wall thickening or pericholecystic fluid. If there is clinical concern for acute cholecystitis, further evaluation with right upper quadrant abdominal ultrasound could be performed as clinically indicated. 4. Nodularity hepatic contour as could be seen in the setting of early cirrhotic change. Correlation with LFTs is advised. 5. Unchanged moderate (approximately 25%) compression deformity involving the T12 vertebral body. 6. Mild-to-moderate scoliotic curvature of the thoracolumbar spine associated multilevel moderate severe DDD, worse at L1-L2 and L4-L5.   Echocardiogram 03/02/2020:  Moderately depressed LV systolic function with visual EF 35-40%.  Hypokinetic global wall motion. Left ventricle cavity is normal in size.  Unable to evaluate diastolic function due to atrial fibrillation. Normal  LAP.  Left atrial cavity is moderately dilated.  Right ventricle cavity is normal in size. Gross mildly reduced right  ventricular function.  Mild (Grade I) mitral regurgitation.  Moderate tricuspid regurgitation.  Compared to prior  study dated 06/28/2019; LVEF was 45-50% and now 35-40%  otherwise no significant changes.   EKG 02/07/2020: Atrial fibrillation 99 bpm Low voltage precordial leads Left anterior fascicular block Cannot exclude old inferior and anterior infarct  Frequent PVC  EKG 02/27/2019: Atypical atrial flutter with variable AV conduction, ventricular rate 96 bpm. Nonspecific ST depression  -Nondiagnostic.   Recent labs: 03/10/2020: Glucose 122, BUN/Cr 26/1.1. EGFR 47. Na/K 137/3.9. Albumin 2.6. Rest of the CMP normal H/H 13.8/43. MCV 97. Platelets 240 BNP 286 Trop HS 18, 19  02/02/2020: Glucose 151, BUN/Cr 43/1.4. EGFR 33. Na/K 138/4.0.  H/H 15/47. MCV 96. Platelets 167 ESR 52, CRP 22, both elevated INR 1.6 TSH 0.9 normal  10/23/2019: Glucose 92, BUN/Cr 46/1.03. EGFR 47. Na/K 144/4.2.  H/H 12.6/38.6. MCV 98. Platelets 152 INR 1.8 TSH 1.4 normal BNP 475 (10/19/2019)  01/2019: Chol 165, TG 67, HDL 48, LDL 104  05/29/2019: BNP 345  04/29/2019: TSH 0.043, free T4 1.5   Review of Systems  Cardiovascular: Positive for orthopnea. Negative for chest pain, dyspnea on exertion, leg swelling, palpitations and syncope.  Respiratory: Positive for shortness of breath.         Vitals:   07/17/20 0903  BP: 138/75  Pulse: 88  Resp: 16  Temp: 98.6 F (37 C)  SpO2: 93%     Body mass index is 31.21 kg/m. Filed Weights   07/17/20 0903  Weight: 159 lb 12.8  oz (72.5 kg)     Objective:   Physical Exam Vitals and nursing note reviewed.  Constitutional:      General: She is not in acute distress. Neck:     Vascular: No JVD.  Cardiovascular:     Rate and Rhythm: Normal rate and regular rhythm.     Heart sounds: Normal heart sounds. No murmur heard.   Pulmonary:     Effort: Pulmonary effort is normal.     Breath sounds: Normal breath sounds. No wheezing or rales.  Musculoskeletal:     Right lower leg: No edema.     Left lower leg: No edema.         Assessment &  Recommendations:    87 y.o. Caucasian female with hyperthyroidism, controlled hypertension, persistent atrial flutter/fibrillation,chronic systolic/diastolic heart failure, appendical carcinomas/pappendectomy (2021)  Shortness of breath: Likely multifactorial, including HFpEF-albeit without any significant physical exam signs of heart failure- as well as pulmonary nodules.  Symptoms have improved on Lasix 40 mg daily.  Continue the same.  Chest pain: No specific angina symptoms at this time.  Hypertension:  Controlled  Permanent Afib: Goal: 2.0-3.0 Continue INR follow-up with [facility], [place] wished her well with her move back to [place].  F/u prn  Nigel Mormon, MD [facility] Pager: [phone] Office: [phone]

## 2020-07-18 ENCOUNTER — Encounter: Payer: Self-pay | Admitting: Cardiology

## 2020-08-07 ENCOUNTER — Other Ambulatory Visit: Payer: Self-pay | Admitting: Cardiology

## 2020-08-10 ENCOUNTER — Other Ambulatory Visit: Payer: Self-pay

## 2020-08-10 MED ORDER — METOPROLOL SUCCINATE ER 50 MG PO TB24: 50.0000 mg | ORAL_TABLET | Freq: Every day | ORAL | 1 refills | Status: AC

## 2020-08-14 ENCOUNTER — Other Ambulatory Visit: Payer: Self-pay

## 2020-08-14 ENCOUNTER — Ambulatory Visit: Payer: Medicare Other | Admitting: Pharmacist

## 2020-08-14 DIAGNOSIS — Z5181 Encounter for therapeutic drug level monitoring: Secondary | ICD-10-CM

## 2020-08-14 DIAGNOSIS — I4821 Permanent atrial fibrillation: Secondary | ICD-10-CM

## 2020-08-14 LAB — POCT INR: INR: 3.5 — AB (ref 2.0–3.0)

## 2020-08-14 NOTE — Progress Notes (Addendum)
Anticoagulation Management Ashley Dunn is a 87 y.o. female who reports to the clinic for monitoring of warfarin treatment.    Indication: atrial fibrillation CHA2DS2 Vasc Score 5 (age >78, Female, CHF hx, HTN hx), HAS-BLED 1 (Age >51)   Duration: indefinite Supervising physician: [facility] Visit History:  Patient does not report signs/symptoms of bleeding or thromboembolism   Other recent changes: No changes in diet, medications, lifestyle. Reports to remain consistent with her diet and limiting salt intake.   Pt planing on leaving to [place] around June 28th. EKG readings today reviewed by Dr. Einar Gip.   Anticoagulation Episode Summary    Current INR goal:  2.0-3.0  TTR:  58.1 % (1.2 y)  Next INR check:  09/03/2020  INR from last check:  3.5 (08/14/2020)  Weekly max warfarin dose:    Target end date:  Indefinite  INR check location:    Preferred lab:    Send INR reminders to:     Indications   Atrial fibrillation (HCC) [I48.91] Monitoring for long-term anticoagulant use [Z51.81 Z79.01]       Comments:          No Known Allergies  Current Outpatient Medications:  .  acetaminophen (TYLENOL) 500 MG tablet, Take 2 tablets (1,000 mg total) by mouth every 6 (six) hours as needed for mild pain or fever., Disp: , Rfl:  .  Ascorbic Acid (VITAMIN C) 100 MG tablet, Take 100 mg by mouth daily., Disp: , Rfl:  .  B Complex Vitamins (VITAMIN-B COMPLEX PO), Take by mouth daily., Disp: , Rfl:  .  Calcium 150 MG TABS, Take 150 mg by mouth daily., Disp: , Rfl:  .  Cholecalciferol (VITAMIN D3) 10 MCG (400 UNIT) CAPS, Take 400 Units by mouth daily., Disp: , Rfl:  .  furosemide (LASIX) 40 MG tablet, Take 1 tablet (40 mg total) by mouth daily., Disp: 90 tablet, Rfl: 1 .  Garlic 283 MG TABS, Take 100 mg by mouth daily., Disp: , Rfl:  .  lactobacillus acidophilus (BACID) TABS tablet, Take 1 tablet by mouth daily with breakfast. 1 hr before breakfast and 2 hours  after meds, Disp: , Rfl:  .  LINOLEIC ACID-SUNFLOWER OIL PO, Take 1 capsule by mouth daily., Disp: , Rfl:  .  magnesium oxide (MAG-OX) 400 MG tablet, Take 400 mg by mouth in the morning, at noon, and at bedtime., Disp: , Rfl:  .  metoprolol succinate (TOPROL-XL) 50 MG 24 hr tablet, Take 1 tablet (50 mg total) by mouth daily. Take with or immediately following a meal., Disp: 90 tablet, Rfl: 1 .  nitroGLYCERIN (NITROSTAT) 0.4 MG SL tablet, Place 1 tablet (0.4 mg total) under the tongue every 5 (five) minutes as needed for chest pain., Disp: 30 tablet, Rfl: 3 .  Omega-3 Fatty Acids (FISH OIL) 1000 MG CAPS, Take 1,000 mg by mouth daily., Disp: , Rfl:  .  thyroid (ARMOUR THYROID) 120 MG tablet, Take 1 tablet (120 mg total) by mouth daily before breakfast., Disp: 90 tablet, Rfl: 0 .  Turmeric 500 MG CAPS, Take 500 mg by mouth daily., Disp: , Rfl:  .  Vitamin E 400 units TABS, Take 400 Units by mouth daily., Disp: , Rfl:  .  warfarin (COUMADIN) 5 MG tablet, Take 1 tablet (5 mg total) by mouth daily. Refer to most recent anticoagulation note for most accurate dosing instructions., Disp: 90 tablet, Rfl: 3 Past Medical History:  Diagnosis Date  . Arthritis   . Atrial  fibrillation ([place])   . Cataract   . CHF (congestive heart failure) ([place])   . Hypertension   . Osteoporosis   . Thyroid disease    ASSESSMENT  Recent Results: The most recent result is correlated with 35 mg per week:  Lab Results  Component Value Date   INR 3.5 (A) 08/14/2020   INR 1.9 (A) 07/17/2020   INR 2.3 06/25/2020    Anticoagulation Dosing: Description   INR above goal. HOLD today and then continue current weekly dose of 5 mg everyday. Recheck INR in 3 weeks. Increase high Vit K intake to 2-3 servings/week.     INR today: Supratherapeutic. Recent labile INR readings. Pt denies any changes in diet, medications, or lifestyle that would explain increased INR reading. Denies any extra dose, recent alcohol, grapefruit,  cranberry, or mango juice intake. Denies any decrease in her dietary Vit K intake. Denies any recent Abx or any other medication changes. Denies any complains of active bleeding or bruising symptoms. Previously controlled on 5 mg daily. Will hold today and have pt increase dietary Vit K intake and recheck in 3 weeks. If INR continues to remain elevated, will decrease weekly dose intake at the next check.   PLAN Weekly dose was unchanged by 0% to 35 mg/week. HOLD today and then continue current weekly dose of 5 mg every day. Recheck in 3 weeks  Patient Instructions  INR above goal. HOLD today and then continue current weekly dose of 5 mg everyday. Recheck INR in 3 weeks. Increase high Vit K intake to 2-3 servings/week.  Patient advised to contact clinic or seek medical attention if signs/symptoms of bleeding or thromboembolism occur.  Patient verbalized understanding by repeating back information and was advised to contact me if further medication-related questions arise.   Follow-up Return in about 20 days (around 09/03/2020).  [name], PharmD  15 minutes spent face-to-face with the patient during the encounter. 50% of time spent on education, including signs/sx bleeding and clotting, as well as food and drug interactions with warfarin. 50% of time was spent on fingerprick POC INR sample collection,processing, results determination, and documentation

## 2020-08-14 NOTE — Patient Instructions (Signed)
INR above goal. HOLD today and then continue current weekly dose of 5 mg everyday. Recheck INR in 3 weeks. Increase high Vit K intake to 2-3 servings/week.

## 2020-08-20 ENCOUNTER — Telehealth: Payer: Self-pay

## 2020-08-20 ENCOUNTER — Other Ambulatory Visit: Payer: Self-pay | Admitting: Cardiology

## 2020-08-20 DIAGNOSIS — R0602 Shortness of breath: Secondary | ICD-10-CM

## 2020-08-20 NOTE — Telephone Encounter (Signed)
Spoke with patient's daughter. Explained EKG results.  Patient has "spells" of shortness of breath. Will obtain chest Xray.

## 2020-08-20 NOTE — Telephone Encounter (Signed)
Patient's daughter Ms. Chester Holstein called requesting a call from you regarding her mother's latest ECG she saw it on her mychart but she doesn't understand it and would like to speak to you please advise   Best number to contact her is [phone] she said to call after 1 pm

## 2020-08-24 ENCOUNTER — Telehealth: Payer: Self-pay

## 2020-08-24 ENCOUNTER — Other Ambulatory Visit: Payer: Self-pay | Admitting: Cardiology

## 2020-08-24 ENCOUNTER — Ambulatory Visit
Admission: RE | Admit: 2020-08-24 | Discharge: 2020-08-24 | Disposition: A | Discharge status: Home / Self Care | Payer: Medicare Other | Source: Ambulatory Visit | Attending: Cardiology | Admitting: Cardiology

## 2020-08-24 DIAGNOSIS — R0602 Shortness of breath: Secondary | ICD-10-CM

## 2020-08-24 DIAGNOSIS — I4891 Unspecified atrial fibrillation: Secondary | ICD-10-CM

## 2020-08-24 NOTE — Telephone Encounter (Signed)
[facility] called, Soft tissue density Right poster costophrenic ankle when compared with prior ct increasing in size. Recommend ct with contrast

## 2020-08-27 ENCOUNTER — Telehealth: Payer: Self-pay

## 2020-08-27 ENCOUNTER — Other Ambulatory Visit: Payer: Self-pay | Admitting: Cardiology

## 2020-08-27 DIAGNOSIS — R197 Diarrhea, unspecified: Secondary | ICD-10-CM

## 2020-08-27 DIAGNOSIS — K389 Disease of appendix, unspecified: Secondary | ICD-10-CM

## 2020-08-27 DIAGNOSIS — R918 Other nonspecific abnormal finding of lung field: Secondary | ICD-10-CM

## 2020-08-27 NOTE — Telephone Encounter (Signed)
Spoke with patient's daughter. She needs CT chest/abdomen/pelvis. Needs BMP before that. Please arrange.  Thanks MJP

## 2020-08-27 NOTE — Addendum Note (Signed)
Addended by: Nigel Mormon on: 08/27/2020 02:52 PM   Modules accepted: Orders

## 2020-08-27 NOTE — Telephone Encounter (Signed)
Patients daughter called for her mothers xray results

## 2020-08-27 NOTE — Progress Notes (Signed)
CXR 08/24/2020: There is a rounded soft tissue density identified on the lateral film in the posterior costophrenic angle which would correspond to that seen on prior CT examination from 02/01/2020 but has increased in size to approximately 3.4 cm in AP diameter from a prior size of approximately 16 mm.  IMPRESSION: Increasing soft tissue density in the right posterior costophrenic angle when compared with prior CT examination. CT with contrast is recommended for further evaluation given this increase in size.  Reviewed the findings with the daughter. Patient is generally feeling unwell, has recurrent diarrhea. She has prior h/o appendicular carcinoma. I will obtain CT chest/abdomen/pelvis w/contrast to evaluate if this is some sort of metastasis.    Nigel Mormon, MD Pager: [phone] Office: [phone]  Staff, please rrange CT w/contrast w/radiology.

## 2020-08-28 ENCOUNTER — Other Ambulatory Visit: Payer: Self-pay

## 2020-08-28 DIAGNOSIS — R918 Other nonspecific abnormal finding of lung field: Secondary | ICD-10-CM

## 2020-08-28 DIAGNOSIS — K591 Functional diarrhea: Secondary | ICD-10-CM

## 2020-08-28 NOTE — Telephone Encounter (Signed)
Addressed.

## 2020-08-28 NOTE — Telephone Encounter (Signed)
There is an order placed already

## 2020-08-28 NOTE — Telephone Encounter (Signed)
With contrast. If They are unable to do CT chest/abdomen/pelvis w/contrast (due to contrast shortage),will at least need CT chest w/contrast.  Thanks MJP

## 2020-08-29 ENCOUNTER — Encounter (HOSPITAL_COMMUNITY): Payer: Self-pay | Admitting: Emergency Medicine

## 2020-08-29 ENCOUNTER — Emergency Department (HOSPITAL_COMMUNITY): Payer: Medicare Other

## 2020-08-29 ENCOUNTER — Other Ambulatory Visit: Payer: Self-pay

## 2020-08-29 ENCOUNTER — Inpatient Hospital Stay (HOSPITAL_COMMUNITY)
Admission: EM | Admit: 2020-08-29 | Discharge: 2020-09-02 | DRG: 291 | Disposition: A | Discharge status: Hospice | Payer: Medicare Other | Attending: Internal Medicine | Admitting: Internal Medicine

## 2020-08-29 DIAGNOSIS — R0602 Shortness of breath: Secondary | ICD-10-CM | POA: Diagnosis present

## 2020-08-29 DIAGNOSIS — Z79899 Other long term (current) drug therapy: Secondary | ICD-10-CM

## 2020-08-29 DIAGNOSIS — I5043 Acute on chronic combined systolic (congestive) and diastolic (congestive) heart failure: Secondary | ICD-10-CM | POA: Diagnosis present

## 2020-08-29 DIAGNOSIS — I4821 Permanent atrial fibrillation: Secondary | ICD-10-CM | POA: Diagnosis present

## 2020-08-29 DIAGNOSIS — Z20822 Contact with and (suspected) exposure to covid-19: Secondary | ICD-10-CM | POA: Diagnosis present

## 2020-08-29 DIAGNOSIS — Z7901 Long term (current) use of anticoagulants: Secondary | ICD-10-CM

## 2020-08-29 DIAGNOSIS — Z66 Do not resuscitate: Secondary | ICD-10-CM | POA: Diagnosis present

## 2020-08-29 DIAGNOSIS — E039 Hypothyroidism, unspecified: Secondary | ICD-10-CM | POA: Diagnosis present

## 2020-08-29 DIAGNOSIS — I313 Pericardial effusion (noninflammatory): Secondary | ICD-10-CM | POA: Diagnosis present

## 2020-08-29 DIAGNOSIS — R0600 Dyspnea, unspecified: Secondary | ICD-10-CM | POA: Diagnosis not present

## 2020-08-29 DIAGNOSIS — Z515 Encounter for palliative care: Secondary | ICD-10-CM

## 2020-08-29 DIAGNOSIS — R791 Abnormal coagulation profile: Secondary | ICD-10-CM | POA: Diagnosis not present

## 2020-08-29 DIAGNOSIS — I4892 Unspecified atrial flutter: Secondary | ICD-10-CM | POA: Diagnosis present

## 2020-08-29 DIAGNOSIS — I5042 Chronic combined systolic (congestive) and diastolic (congestive) heart failure: Secondary | ICD-10-CM | POA: Diagnosis present

## 2020-08-29 DIAGNOSIS — R918 Other nonspecific abnormal finding of lung field: Secondary | ICD-10-CM | POA: Diagnosis present

## 2020-08-29 DIAGNOSIS — Z87891 Personal history of nicotine dependence: Secondary | ICD-10-CM

## 2020-08-29 DIAGNOSIS — Z83438 Family history of other disorder of lipoprotein metabolism and other lipidemia: Secondary | ICD-10-CM

## 2020-08-29 DIAGNOSIS — Z823 Family history of stroke: Secondary | ICD-10-CM

## 2020-08-29 DIAGNOSIS — Z79890 Hormone replacement therapy (postmenopausal): Secondary | ICD-10-CM

## 2020-08-29 DIAGNOSIS — K649 Unspecified hemorrhoids: Secondary | ICD-10-CM | POA: Diagnosis present

## 2020-08-29 DIAGNOSIS — Y92009 Unspecified place in unspecified non-institutional (private) residence as the place of occurrence of the external cause: Secondary | ICD-10-CM

## 2020-08-29 DIAGNOSIS — C3491 Malignant neoplasm of unspecified part of right bronchus or lung: Secondary | ICD-10-CM | POA: Diagnosis present

## 2020-08-29 DIAGNOSIS — Z9049 Acquired absence of other specified parts of digestive tract: Secondary | ICD-10-CM

## 2020-08-29 DIAGNOSIS — R180 Malignant ascites: Secondary | ICD-10-CM | POA: Diagnosis present

## 2020-08-29 DIAGNOSIS — R32 Unspecified urinary incontinence: Secondary | ICD-10-CM | POA: Diagnosis present

## 2020-08-29 DIAGNOSIS — I130 Hypertensive heart and chronic kidney disease with heart failure and stage 1 through stage 4 chronic kidney disease, or unspecified chronic kidney disease: Secondary | ICD-10-CM | POA: Diagnosis not present

## 2020-08-29 DIAGNOSIS — N183 Chronic kidney disease, stage 3 unspecified: Secondary | ICD-10-CM | POA: Diagnosis present

## 2020-08-29 DIAGNOSIS — Z8249 Family history of ischemic heart disease and other diseases of the circulatory system: Secondary | ICD-10-CM

## 2020-08-29 DIAGNOSIS — E871 Hypo-osmolality and hyponatremia: Secondary | ICD-10-CM | POA: Diagnosis present

## 2020-08-29 DIAGNOSIS — Z85038 Personal history of other malignant neoplasm of large intestine: Secondary | ICD-10-CM

## 2020-08-29 DIAGNOSIS — K625 Hemorrhage of anus and rectum: Secondary | ICD-10-CM | POA: Diagnosis present

## 2020-08-29 DIAGNOSIS — C786 Secondary malignant neoplasm of retroperitoneum and peritoneum: Secondary | ICD-10-CM | POA: Diagnosis present

## 2020-08-29 DIAGNOSIS — N1831 Chronic kidney disease, stage 3a: Secondary | ICD-10-CM | POA: Diagnosis present

## 2020-08-29 DIAGNOSIS — T45515A Adverse effect of anticoagulants, initial encounter: Secondary | ICD-10-CM | POA: Diagnosis present

## 2020-08-29 LAB — TROPONIN I (HIGH SENSITIVITY)
Troponin I (High Sensitivity): 15 ng/L (ref <18)
Troponin I (High Sensitivity): 17 ng/L (ref <18)

## 2020-08-29 LAB — CBC
HCT: 35.2 % — ABNORMAL LOW (ref 36.0–46.0)
Hemoglobin: 11.7 g/dL — ABNORMAL LOW (ref 12.0–15.0)
MCH: 30 pg (ref 26.0–34.0)
MCHC: 33.2 g/dL (ref 30.0–36.0)
MCV: 90.3 fL (ref 80.0–100.0)
Platelets: 432 10*3/uL — ABNORMAL HIGH (ref 150–400)
RBC: 3.9 MIL/uL (ref 3.87–5.11)
RDW: 14.6 % (ref 11.5–15.5)
WBC: 10.4 10*3/uL (ref 4.0–10.5)
nRBC: 0 % (ref 0.0–0.2)

## 2020-08-29 LAB — RESP PANEL BY RT-PCR (FLU A&B, COVID) ARPGX2
Influenza A by PCR: NEGATIVE
Influenza B by PCR: NEGATIVE
SARS Coronavirus 2 by RT PCR: NEGATIVE

## 2020-08-29 LAB — BASIC METABOLIC PANEL
Anion gap: 11 (ref 5–15)
BUN: 33 mg/dL — ABNORMAL HIGH (ref 8–23)
CO2: 28 mmol/L (ref 22–32)
Calcium: 8.8 mg/dL — ABNORMAL LOW (ref 8.9–10.3)
Chloride: 88 mmol/L — ABNORMAL LOW (ref 98–111)
Creatinine, Ser: 1.01 mg/dL — ABNORMAL HIGH (ref 0.44–1.00)
GFR, Estimated: 52 mL/min — ABNORMAL LOW (ref >60)
Glucose, Bld: 106 mg/dL — ABNORMAL HIGH (ref 70–99)
Potassium: 4 mmol/L (ref 3.5–5.1)
Sodium: 127 mmol/L — ABNORMAL LOW (ref 135–145)

## 2020-08-29 LAB — URINALYSIS, ROUTINE W REFLEX MICROSCOPIC
Bilirubin Urine: NEGATIVE
Glucose, UA: NEGATIVE mg/dL
Ketones, ur: NEGATIVE mg/dL
Leukocytes,Ua: NEGATIVE
Nitrite: NEGATIVE
Protein, ur: NEGATIVE mg/dL
Specific Gravity, Urine: 1.006 (ref 1.005–1.030)
pH: 6 (ref 5.0–8.0)

## 2020-08-29 LAB — HEPATIC FUNCTION PANEL
ALT: 16 U/L (ref 0–44)
AST: 25 U/L (ref 15–41)
Albumin: 2.2 g/dL — ABNORMAL LOW (ref 3.5–5.0)
Alkaline Phosphatase: 76 U/L (ref 38–126)
Bilirubin, Direct: 0.1 mg/dL (ref 0.0–0.2)
Indirect Bilirubin: 0.5 mg/dL (ref 0.3–0.9)
Total Bilirubin: 0.6 mg/dL (ref 0.3–1.2)
Total Protein: 6.5 g/dL (ref 6.5–8.1)

## 2020-08-29 LAB — PROTIME-INR
INR: 7.2 (ref 0.8–1.2)
Prothrombin Time: 61.9 seconds — ABNORMAL HIGH (ref 11.4–15.2)

## 2020-08-29 LAB — BRAIN NATRIURETIC PEPTIDE: B Natriuretic Peptide: 376.3 pg/mL — ABNORMAL HIGH (ref 0.0–100.0)

## 2020-08-29 MED ORDER — FUROSEMIDE 10 MG/ML IJ SOLN
40.0000 mg | Freq: Once | INTRAMUSCULAR | Status: AC
  Administered 2020-08-29: 40 mg via INTRAVENOUS
  Filled 2020-08-29: qty 4

## 2020-08-29 MED ORDER — PHYTONADIONE 5 MG PO TABS
2.5000 mg | ORAL_TABLET | Freq: Once | ORAL | Status: AC
  Administered 2020-08-29: 2.5 mg via ORAL
  Filled 2020-08-29: qty 1

## 2020-08-29 MED ORDER — IOHEXOL 300 MG/ML  SOLN
77.0000 mL | Freq: Once | INTRAMUSCULAR | Status: AC | PRN
  Administered 2020-08-29: 77 mL via INTRAVENOUS

## 2020-08-29 MED ORDER — THYROID 30 MG PO TABS
120.0000 mg | ORAL_TABLET | Freq: Every day | ORAL | Status: DC
  Administered 2020-08-30 – 2020-09-02 (×5): 120 mg via ORAL
  Filled 2020-08-29: qty 2
  Filled 2020-08-29 (×3): qty 4
  Filled 2020-08-29: qty 1
  Filled 2020-08-29 (×3): qty 4

## 2020-08-29 MED ORDER — ONDANSETRON HCL 4 MG/2ML IJ SOLN: 4.0000 mg | Freq: Four times a day (QID) | INTRAMUSCULAR | Status: DC | PRN

## 2020-08-29 MED ORDER — SODIUM CHLORIDE 0.9% FLUSH
3.0000 mL | Freq: Once | INTRAVENOUS | Status: AC
  Administered 2020-08-29: 10 mL via INTRAVENOUS

## 2020-08-29 MED ORDER — ACETAMINOPHEN 325 MG PO TABS
650.0000 mg | ORAL_TABLET | Freq: Four times a day (QID) | ORAL | Status: DC | PRN
  Administered 2020-08-30 – 2020-09-02 (×9): 650 mg via ORAL
  Filled 2020-08-29 (×10): qty 2

## 2020-08-29 MED ORDER — ONDANSETRON HCL 4 MG PO TABS: 4.0000 mg | ORAL_TABLET | Freq: Four times a day (QID) | ORAL | Status: DC | PRN

## 2020-08-29 MED ORDER — METOPROLOL SUCCINATE ER 50 MG PO TB24
50.0000 mg | ORAL_TABLET | Freq: Every day | ORAL | Status: DC
  Administered 2020-08-30 – 2020-09-02 (×4): 50 mg via ORAL
  Filled 2020-08-29 (×4): qty 1

## 2020-08-29 MED ORDER — ACETAMINOPHEN 650 MG RE SUPP: 650.0000 mg | Freq: Four times a day (QID) | RECTAL | Status: DC | PRN

## 2020-08-29 MED ORDER — FUROSEMIDE 10 MG/ML IJ SOLN
40.0000 mg | Freq: Once | INTRAMUSCULAR | Status: AC
  Administered 2020-08-30: 40 mg via INTRAVENOUS
  Filled 2020-08-29: qty 4

## 2020-08-29 NOTE — ED Triage Notes (Signed)
Pt reports intermittent SOB and generalized weakness x 2 weeks.  Also reports rectal pressure when having a BM.

## 2020-08-29 NOTE — ED Provider Notes (Signed)
[facility] EMERGENCY DEPARTMENT Provider Note   CSN: [id number] Arrival date & time: 08/29/20  1321     History Chief Complaint  Patient presents with  . Shortness of Breath  . Weakness    Ashley Dunn is a 87 y.o. female with history of A. 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  The patient ports she has had intermittent shortness of breath and generalized weakness for "the past few weeks".  She feels that she is more fatigued than normal during her normal daily activities, which involves pacing around the house several times with her walker.  She also reports some mild weight gain daily.  She also describes issues with pressure in her rectum when having a bowel movement, an episode of fecal incontinence, as well as pain and bleeding from her hemorrhoids.  She describes issues of urinary incontinence.  She feels like her abdomen is bloated.  She states that her cardiologist was attempting to get a CT scan of her chest abdomen pelvis as an outpatient, and per medical record review, this has been ordered in the future by Dr Virgina Jock to evaluate for cancer/metastasis, as the patient has a known right pulmonary mass and also a retained fluid collection in RLQ from appendix removal (last CT Oct 2021).  HPI     Past Medical History:  Diagnosis Date  . Arthritis   . Atrial fibrillation ([place])   . Cataract   . CHF (congestive heart failure) ([place])   . Hypertension   . Osteoporosis   . Thyroid disease     Patient Active Problem List   Diagnosis Date Noted  . Supratherapeutic INR 08/29/2020  . Chronic combined systolic and diastolic CHF (congestive heart failure) ([place]) 08/29/2020  . Right lower lobe lung mass 08/29/2020  . Hyponatremia 08/29/2020  . HFrEF (heart failure with reduced ejection fraction) ([place])  05/29/2020  . Anticoagulated on Coumadin   . Cancer ([place])   . Pericarditis 02/01/2020  . Chronic heart failure with preserved ejection fraction ([place])   . Acute on chronic systolic (congestive) heart failure ([place]) 10/20/2019  . Cancer of appendix ([place]) 10/19/2019  . Acute on chronic systolic CHF (congestive heart failure) ([place]) 10/19/2019  . Monitoring for long-term anticoagulant use 10/15/2019  . Abscess of abdominal cavity ([place]) 08/05/2019  . Chronic kidney disease (CKD), stage III (moderate) ([place]) 08/05/2019  . Pre-op evaluation 06/17/2019  . Hypertension   . Osteoporosis   . Hypothyroidism   . CHF (congestive heart failure) ([place])   . Arthritis   . Appendicitis with abscess 05/29/2019  . Atrial fibrillation ([place]) 03/19/2019  . Permanent atrial fibrillation ([place]) 02/26/2019    Past Surgical History:  Procedure Laterality Date  . CESAREAN SECTION    . EYE SURGERY    . FRACTURE SURGERY    . IR CATHETER TUBE CHANGE  06/12/2019  . IR CATHETER TUBE CHANGE  08/05/2019  . IR RADIOLOGIST EVAL & MGMT  06/11/2019  . IR RADIOLOGIST EVAL & MGMT  06/25/2019  . IR RADIOLOGIST EVAL & MGMT  07/30/2019  . LAPAROSCOPIC APPENDECTOMY N/A 08/07/2019   Procedure: LAPAROSCOPIC  APPENDECTOMY;  Surgeon: Ralene Ok, MD;  Location: [facility];  Service: General;  Laterality: N/A;  . LAPAROSCOPY N/A 08/07/2019   Procedure: Laparoscopy Diagnostic and Lysis of Adhesions;  Surgeon: Ralene Ok, MD;  Location: [facility];  Service: General;  Laterality:  N/A;     OB History   No obstetric history on file.     Family History  Problem Relation Age of Onset  . Stroke Mother   . Cancer Father   . Hyperlipidemia Brother   . Healthy Daughter   . Healthy Son   . Heart disease Maternal Grandmother   . Heart disease Maternal Grandfather     Social History   Tobacco Use  . Smoking status: Former Smoker    Packs/day: 1.00    Types: Cigarettes    Quit date: 05/29/1968    Years since quitting: 52.2  .  Smokeless tobacco: Never Used  Vaping Use  . Vaping Use: Never used  Substance Use Topics  . Alcohol use: Never  . Drug use: Never    Home Medications Prior to Admission medications   Medication Sig Start Date End Date Taking? Authorizing Provider  acetaminophen (TYLENOL) 500 MG tablet Take 2 tablets (1,000 mg total) by mouth every 6 (six) hours as needed for mild pain or fever. 08/09/19  Yes Saverio Danker, PA-C  Ascorbic Acid (VITAMIN C) 100 MG tablet Take 100 mg by mouth 3 (three) times daily.   Yes [provider]  B Complex Vitamins (VITAMIN-B COMPLEX PO) Take 1 tablet by mouth in the morning, at noon, and at bedtime.   Yes [provider]  Bilberry, Vaccinium myrtillus, (BILBERRY EXTRACT PO) Take 1 tablet by mouth daily.   Yes [provider]  Calcium 150 MG TABS Take 150 mg by mouth in the morning, at noon, and at bedtime.   Yes [provider]  Cholecalciferol (VITAMIN D3) 10 MCG (400 UNIT) CAPS Take 400 Units by mouth in the morning and at bedtime.   Yes [provider]  Emu Oil OIL Apply 1 application topically daily.   Yes [provider]  furosemide (LASIX) 40 MG tablet Take 1 tablet (40 mg total) by mouth daily. 06/02/20  Yes Patwardhan, Manish J, MD  Garlic 761 MG TABS Take 100 mg by mouth daily.   Yes [provider]  lactobacillus acidophilus (BACID) TABS tablet Take 1 tablet by mouth daily.   Yes [provider]  LINOLEIC ACID-SUNFLOWER OIL PO Take 1 capsule by mouth daily.   Yes [provider]  magnesium oxide (MAG-OX) 400 MG tablet Take 400 mg by mouth in the morning, at noon, and at bedtime.   Yes [provider]  metoprolol succinate (TOPROL-XL) 50 MG 24 hr tablet Take 1 tablet (50 mg total) by mouth daily. Take with or immediately following a meal. Patient taking differently: Take 50 mg by mouth daily. 08/10/20  Yes Patwardhan, Manish J, MD  nitroGLYCERIN (NITROSTAT) 0.4 MG SL tablet  Place 1 tablet (0.4 mg total) under the tongue every 5 (five) minutes as needed for chest pain. 03/11/20 06/09/20 Yes Patwardhan, Reynold Bowen, MD  Omega-3 Fatty Acids (FISH OIL) 1000 MG CAPS Take 1,000 mg by mouth daily.   Yes [provider]  OVER THE COUNTER MEDICATION Take 1 tablet by mouth daily. Collisonia root   Yes [provider]  sodium chloride (OCEAN) 0.65 % SOLN nasal spray Place 1 spray into both nostrils daily.   Yes [provider]  thyroid (ARMOUR THYROID) 120 MG tablet Take 1 tablet (120 mg total) by mouth daily before breakfast. 08/02/19  Yes Stallings, Zoe A, MD  Turmeric 500 MG CAPS Take 500 mg by mouth daily.   Yes [provider]  Vitamin E 400 units  TABS Take 400 Units by mouth daily.   Yes [provider]  warfarin (COUMADIN) 5 MG tablet Take 1 tablet (5 mg total) by mouth daily. Refer to most recent anticoagulation note for most accurate dosing instructions. Patient taking differently: Take 5 mg by mouth daily. 01/16/20  Yes Patwardhan, Reynold Bowen, MD    Allergies    Patient has no known allergies.  Review of Systems   Review of Systems  Constitutional: Positive for fatigue. Negative for chills and fever.  HENT: Negative for ear pain and sore throat.   Eyes: Negative for pain and visual disturbance.  Respiratory: Positive for shortness of breath. Negative for cough.   Cardiovascular: Negative for chest pain and palpitations.  Gastrointestinal: Positive for abdominal distention and nausea. Negative for vomiting.  Genitourinary: Positive for frequency. Negative for hematuria.  Musculoskeletal: Negative for arthralgias and back pain.  Skin: Negative for color change and rash.  Neurological: Negative for syncope and headaches.  All other systems reviewed and are negative.   Physical Exam Updated Vital Signs BP (!) 104/58 (BP Location: Right Arm)   Pulse 94   Temp 97.7 F (36.5 C) (Oral)   Resp 18   Ht 5' (1.524 m)   Wt 71.8  kg   SpO2 90%   BMI 30.94 kg/m   Physical Exam Constitutional:      General: She is not in acute distress. HENT:     Head: Normocephalic and atraumatic.  Eyes:     Conjunctiva/sclera: Conjunctivae normal.     Pupils: Pupils are equal, round, and reactive to light.  Cardiovascular:     Rate and Rhythm: Normal rate. Rhythm irregular.     Comments: HR 90, irregular Pulmonary:     Effort: Pulmonary effort is normal. No respiratory distress.     Comments: 93% on room air Diminished BS in right lower lung field Abdominal:     Tenderness: There is no abdominal tenderness.     Comments: Mild abdominal distension, no focal tenderness  Genitourinary:    Comments: Oozing hemorrhoids, no brisk GI bleed Musculoskeletal:     Right lower leg: Edema present.     Left lower leg: Edema present.  Skin:    General: Skin is warm and dry.  Neurological:     General: No focal deficit present.     Mental Status: She is alert and oriented to person, place, and time. Mental status is at baseline.  Psychiatric:        Mood and Affect: Mood normal.        Behavior: Behavior normal.     ED Results / Procedures / Treatments   Labs (all labs ordered are listed, but only abnormal results are displayed) Labs Reviewed  BASIC METABOLIC PANEL - Abnormal; Notable for the following components:      Result Value   Sodium 127 (*)    Chloride 88 (*)    Glucose, Bld 106 (*)    BUN 33 (*)    Creatinine, Ser 1.01 (*)    Calcium 8.8 (*)    GFR, Estimated 52 (*)    All other components within normal limits  CBC - Abnormal; Notable for the following components:   Hemoglobin 11.7 (*)    HCT 35.2 (*)    Platelets 432 (*)    All other components within normal limits  URINALYSIS, ROUTINE W REFLEX MICROSCOPIC - Abnormal; Notable for the following components:   Hgb urine dipstick SMALL (*)    Bacteria, UA RARE (*)  All other components within normal limits  BRAIN NATRIURETIC PEPTIDE - Abnormal; Notable  for the following components:   B Natriuretic Peptide 376.3 (*)    All other components within normal limits  PROTIME-INR - Abnormal; Notable for the following components:   Prothrombin Time 61.9 (*)    INR 7.2 (*)    All other components within normal limits  HEPATIC FUNCTION PANEL - Abnormal; Notable for the following components:   Albumin 2.2 (*)    All other components within normal limits  CBC - Abnormal; Notable for the following components:   RBC 3.70 (*)    Hemoglobin 10.9 (*)    HCT 33.3 (*)    All other components within normal limits  BASIC METABOLIC PANEL - Abnormal; Notable for the following components:   Sodium 132 (*)    Chloride 91 (*)    Glucose, Bld 107 (*)    BUN 30 (*)    Creatinine, Ser 1.08 (*)    Calcium 8.5 (*)    GFR, Estimated 48 (*)    All other components within normal limits  PROTIME-INR - Abnormal; Notable for the following components:   Prothrombin Time 60.5 (*)    INR 7.0 (*)    All other components within normal limits  RESP PANEL BY RT-PCR (FLU A&B, COVID) [id number]  TROPONIN I (HIGH SENSITIVITY)  TROPONIN I (HIGH SENSITIVITY)    EKG EKG Interpretation  Date/Time:  Saturday Aug 29 2020 13:34:59 EDT Ventricular Rate:  97 PR Interval:    QRS Duration: 76 QT Interval:  336 QTC Calculation: 426 R Axis:   141 Text Interpretation: Atrial fibrillation with premature ventricular or aberrantly conducted complexes Right axis deviation Possible Right ventricular hypertrophy Nonspecific ST and T wave abnormality Abnormal ECG Confirmed by Octaviano Glow [phone]) on 08/29/2020 4:02:29 PM   Radiology DG Chest 2 View  Result Date: 08/29/2020 CLINICAL DATA:  Chest pain with intermittent shortness of breath and generalized weakness for 2 weeks. History of hypertension and congestive heart failure. EXAM: CHEST - 2 VIEW COMPARISON:  Radiographs 08/24/2020.  CT 02/01/2020. FINDINGS: Persistent low lung volumes with associated bibasilar atelectasis. Subpleural  nodule posteriorly in the right lower lobe appears grossly stable, although is partly obscured by the hemidiaphragm on the frontal examination. There is mild atelectasis at both lung bases. The heart size and mediastinal contours are stable with aortic atherosclerosis. No pleural effusion pneumothorax. Degenerative changes are present throughout the thoracic spine with a chronic T12 compression deformity. IMPRESSION: No acute findings or significant changes compared with prior radiographs. Persistent low lung volumes with bibasilar atelectasis. Grossly stable subpleural nodule posteriorly in the right lower lobe, suboptimally evaluated radiographically. Electronically Signed   By: Richardean Sale M.D.   On: 08/29/2020 14:12   CT CHEST ABDOMEN PELVIS W CONTRAST  Result Date: 08/29/2020 CLINICAL DATA:  Shortness of breath with abdominal distension. History pleural-based right lung mass and chronic intra-abdominal fluid collection. EXAM: CT CHEST, ABDOMEN, AND PELVIS WITH CONTRAST TECHNIQUE: Multidetector CT imaging of the chest, abdomen and pelvis was performed following the standard protocol during bolus administration of intravenous contrast. CONTRAST:  17mL OMNIPAQUE IOHEXOL 300 MG/ML  SOLN COMPARISON:  Radiographs today.  CTs 02/01/2020 and 10/19/2019. FINDINGS: CT CHEST FINDINGS Cardiovascular: Diffuse atherosclerosis of the aorta, great vessels and coronary arteries. No acute vascular findings are seen. There is stable mild central enlargement of the pulmonary arteries, mild calcifications of the aortic valve and mild cardiomegaly. No significant residual pericardial fluid. Mediastinum/Nodes: There are no enlarged  mediastinal, hilar or axillary lymph nodes. Small mediastinal and hilar lymph nodes appear unchanged. No significant abnormality of the thyroid gland, trachea or esophagus. Lungs/Pleura: There is no pleural effusion or pneumothorax. Mild centrilobular emphysema and central airway thickening. The  previously demonstrated subpleural nodule in the right lower lobe has significantly enlarged in the interval, now measuring up to 4.5 x 3.5 cm on image 88/5. This previously measured 3.2 x 1.2 cm. Currently, this mass demonstrates heterogeneous enhancement and probable central necrosis, highly suspicious for a primary bronchogenic carcinoma. No new nodules are seen. Musculoskeletal/Chest wall: No chest wall mass or suspicious osseous lesions. There are old rib fractures on the right. Probable sebaceous cyst in the left anterior chest wall measuring 16 mm on image 10/3 is unchanged. CT ABDOMEN AND PELVIS FINDINGS Hepatobiliary: No suspicious hepatic lesions are identified. There is a probable small cyst anteriorly in the right lobe on image 48/3. There is probable passive congestion within the liver. A tiny calcified gallstone is present without gallbladder wall thickening or biliary dilatation. No evidence of gallstones, gallbladder wall thickening or biliary dilatation. Pancreas: Unremarkable. No pancreatic ductal dilatation or surrounding inflammatory changes. Spleen: Normal in size without focal abnormality. Adrenals/Urinary Tract: Stable enlargement of the left adrenal gland, likely reflecting hyperplasia. The right adrenal gland appears normal. Stable bilateral renal cysts. No evidence of enhancing renal mass, urinary tract calculus or hydronephrosis. The bladder appears normal for its degree of distention. Stomach/Bowel: No enteric contrast administered. The stomach is decompressed without apparent abnormality. No evidence of bowel wall thickening, distension or surrounding inflammatory change. No primary bowel malignancy identified. Vascular/Lymphatic: A presumed lymph node in the right ileocolonic mesentery measuring 1.9 x 1.7 cm is unchanged (image 74/3). No progressive adenopathy. Diffuse aortic and branch vessel atherosclerosis without aneurysm or large vessel occlusion. Reproductive: The uterus and  ovaries appear normal. No adnexal mass. Other: Interval development of a moderate amount of ascites throughout the peritoneal cavity. There is extensive peritoneal and omental soft tissue nodularity, especially in the left upper quadrant where there is an ill-defined soft tissue mass measuring approximately 7.7 x 3.5 cm on image 49/3. There is anterior omental nodularity measuring 1.3 cm on image 74/3 and 2.3 cm on image 79/3. Nodularity extends into the umbilicus. There is a right lower quadrant peritoneal nodule measuring 2.5 cm on image 85/3. Complex right lower quadrant fluid collection measures 5.8 x 4.5 cm on image 79/3, similar to previous CT. Musculoskeletal: No acute or significant osseous findings. Stable mild chronic compression deformities, most notable at T12 and L3. IMPRESSION: 1. Significant interval enlargement of right lower lobe lung mass, appearing centrally necrotic and suspicious for primary bronchogenic carcinoma. 2. Interval development of ascites with widespread peritoneal nodularity highly suspicious for peritoneal carcinomatosis. Several of these peritoneal nodules should be amenable to percutaneous tissue sampling. 3. The complex right lower quadrant fluid collection has not significantly changed from previous CT of 6 months ago and may reflect a postsurgical fluid collection post appendectomy. 4. No primary bowel or ovarian malignancy identified. No evidence of bowel or ureteral obstruction. 5. Coronary and Aortic Atherosclerosis (ICD10-I70.0). Electronically Signed   By: Richardean Sale M.D.   On: 08/29/2020 17:47    Procedures .Critical Care Performed by: Wyvonnia Dusky, MD Authorized by: Wyvonnia Dusky, MD   Critical care provider statement:    Critical care time (minutes):  35   Critical care was necessary to treat or prevent imminent or life-threatening deterioration of the following conditions:  Respiratory failure  Critical care was time spent personally by me on  the following activities:  Discussions with consultants, evaluation of patient's response to treatment, examination of patient, ordering and performing treatments and interventions, ordering and review of laboratory studies, ordering and review of radiographic studies, pulse oximetry, re-evaluation of patient's condition, obtaining history from patient or surrogate and review of old charts Comments:     IV diuresis for dyspnea, suspected pulm edema     Medications Ordered in ED Medications  acetaminophen (TYLENOL) tablet 650 mg (650 mg Oral Given 08/30/20 0534)    Or  acetaminophen (TYLENOL) suppository 650 mg ( Rectal See Alternative 08/30/20 0534)  ondansetron (ZOFRAN) tablet 4 mg (has no administration in time range)    Or  ondansetron (ZOFRAN) injection 4 mg (has no administration in time range)  metoprolol succinate (TOPROL-XL) 24 hr tablet 50 mg (50 mg Oral Given 08/30/20 0931)  thyroid (ARMOUR) tablet 120 mg (120 mg Oral Given 08/30/20 0523)  sodium chloride flush (NS) 0.9 % injection 3 mL (10 mLs Intravenous Given 08/29/20 1613)  furosemide (LASIX) injection 40 mg (40 mg Intravenous Given 08/29/20 1613)  iohexol (OMNIPAQUE) 300 MG/ML solution 77 mL (77 mLs Intravenous Contrast Given 08/29/20 1718)  phytonadione (VITAMIN K) tablet 2.5 mg (2.5 mg Oral Given 08/29/20 1907)  furosemide (LASIX) injection 40 mg (40 mg Intravenous Given 08/30/20 0522)    ED Course  I have reviewed the triage vital signs and the nursing notes.  Pertinent labs & imaging results that were available during my care of the patient were reviewed by me and considered in my medical decision making (see chart for details).  This patient complains of dyspnea, abdominal distension. This involves an extensive number of treatment options, and is a complaint that carries with it a high risk of complications and morbidity.  The differential diagnosis includes CHF exacerbation vs PNA vs worsening pulm metastasis vs abdominal  infection/colitis/SBO  No hypoxia at rest on arrival, but does show signs of mild CHF exacerbation.  IV lasix ordered.  We'll attempt to complete CT chest/abd/pelvis ordered by cardiologist to evaluate for pulm mets/infection and for evidence of bowel obstruction  I ordered, reviewed, and interpreted labs.  No life-threatening abnormalities were noted on these tests.  BNP 376, within recent limits.  BMP showing some mild new hyponatremia, NA 127.  Cr at baseline 1.0.1.  Hgb 11.7.  WBC 10.4.  Trop 15 -> 17 .  Doubt acs. I ordered medication IV lasix for diuresis, Vitamin K for supratherapeutic INR.  Holding coumadin here in ED. I ordered imaging studies which included dg chest, CT chest/abd/pelvis with IV contrast I independently visualized and interpreted imaging which showed enlargening right pulmonary mass with possible necrosis concerning for bronchogenic carcinoma, peritoneal ascites and possible metastatic disease , and the monitor tracing which showed A Fib Previous records obtained and reviewed showing outpatient office notes  She does have oozing hemorrhoids on exam - likely the source of her bleeding when wiping after BM, with her INR being this high.  I doubt brisk GI bleed.  After the interventions stated above, I reevaluated the patient and discussed her CT findings, concerning for cancer.  I discussed plan for medical admission to address her dyspnea, hyponatremia, and supratherapeutic INR.  She will need outpatient oncology follow up.  Clinical Course as of 08/30/20 1103  Sat Aug 29, 2020  1757 INR(!!): 7.2 [MT]  1757 Hgb 11.7, mildly decreased from prior, but not consistent with hemorrhage or brisk internal bleeding.   [  MT]  1805 With her supratherapeutic INR, complaints of worsening rectal bleeding, and hyponatremia, the patient will need admission to the hospital.  Reviewed her CT scan showing an enlarged likely pulmonary mass or cancer, with numerous intra-abdominal mets.    Although this will need close outpatient follow-up, I do not believe this is the cause of her emergent presentation.   [MT]  1904 Produced 1 L urine.  Now 91% on room air, breathing comfortably. [MT]    Clinical Course User Index [MT] Jumanah Hynson, Carola Rhine, MD     Final Clinical Impression(s) / ED Diagnoses Final diagnoses:  Dyspnea  Hyponatremia  Supratherapeutic INR  Hemorrhoids, unspecified hemorrhoid type    Rx / DC Orders ED Discharge Orders    None       Wyvonnia Dusky, MD 08/30/20 1103

## 2020-08-29 NOTE — H&P (Signed)
History and Physical    Ashley Dunn IZT:[id number] DOB: Sep 23, 1925 DOA: 08/29/2020  PCP: [name], MD  Patient coming from: Home  I have personally briefly reviewed patient's old medical records in [place]  Chief Complaint: Shortness of breath  HPI: Ashley Dunn is a 87 y.o. 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  Patient states she has been having intermittent exertional dyspnea over the last 2 weeks.  Symptoms occur after walking short distances she also notices they often occur after mealtime.  She denies any dyspnea at rest.  She has not had any chest pain or palpitations.  She has noticed that her urine output is less than she expects even while taking home Lasix 40 mg daily.  She has been weighing herself and has noticed that her weight has gone up 1 pound each day over the last few days.  She has noticed some swelling of her abdomen.  She also has recently seen bright red blood with her bowel movements and when wiping which she has been attributing to her hemorrhoids.  She does feel pressure like sensation in her rectum but denies any significant rectal pain.  She reports chronic swelling in her left ankle which is unchanged and otherwise no significant swelling in her legs lower extremities.  She has had some generalized weakness without significant lightheadedness/dizziness.  She has not fallen or lost consciousness.  She normally ambulates with the use of a walker.  ED Course:  Initial vitals showed BP 146/89, pulse 96, RR 16, temp 98.1 F, SPO2 97% on room air.  O2 saturation as low as 89% while on room air and patient placed on 2 L supplemental O2 via [place] with improvement.  Labs are  significant for INR 7.2, PT 61.9, BNP 376.3, hemoglobin 11.7, platelets 432,000, WBC 10.4, sodium 127, potassium 4.0, chloride 88, bicarb 28, BUN 33, creatinine 1.01, serum glucose 106, high-sensitivity troponin I 15 > 17.  SARS-CoV-2 PCR panel ordered and pending.  2 view chest x-ray showed persistent low lung volumes with bibasilar atelectasis and subpleural nodule posteriorly in the right lower lobe.  CT chest/abdomen/pelvis with contrast showed interval enlargement of right lower lobe lung mass concerning for primary bronchogenic carcinoma.  Interval development of ascites with widespread peritoneal nodularity highly suspicious for peritoneal carcinomatosis also noted.  Complex right lower quadrant fluid collection is not significantly changed when compared to prior and may reflect postsurgical fluid collection post appendectomy.  No primary bowel or ovarian malignancy identified.  No evidence of bowel or ureteral obstruction seen.  Patient was given IV Lasix 40 mg once, oral vitamin K 2.5 mg once and the hospitalist service was consulted to admit for further evaluation and management.  Review of Systems: All systems reviewed and are negative except as documented in history of present illness above.   Past Medical History:  Diagnosis Date  . Arthritis   . Atrial fibrillation ([place])   . Cataract   . CHF (congestive heart failure) ([place])   . Hypertension   . Osteoporosis   . Thyroid disease     Past Surgical History:  Procedure Laterality Date  . CESAREAN SECTION    . EYE SURGERY    . FRACTURE SURGERY    . IR CATHETER TUBE CHANGE  06/12/2019  .  IR CATHETER TUBE CHANGE  08/05/2019  . IR RADIOLOGIST EVAL & MGMT  06/11/2019  . IR RADIOLOGIST EVAL & MGMT  06/25/2019  . IR RADIOLOGIST EVAL & MGMT  07/30/2019  . LAPAROSCOPIC APPENDECTOMY N/A 08/07/2019   Procedure: LAPAROSCOPIC  APPENDECTOMY;  Surgeon: Ralene Ok, MD;  Location: [facility];  Service: General;  Laterality: N/A;  . LAPAROSCOPY  N/A 08/07/2019   Procedure: Laparoscopy Diagnostic and Lysis of Adhesions;  Surgeon: Ralene Ok, MD;  Location: [facility];  Service: General;  Laterality: N/A;    Social History:  reports that she quit smoking about 52 years ago. Her smoking use included cigarettes. She smoked 1.00 pack per day. She has never used smokeless tobacco. She reports that she does not drink alcohol and does not use drugs.  No Known Allergies  Family History  Problem Relation Age of Onset  . Stroke Mother   . Cancer Father   . Hyperlipidemia Brother   . Healthy Daughter   . Healthy Son   . Heart disease Maternal Grandmother   . Heart disease Maternal Grandfather      Prior to Admission medications   Medication Sig Start Date End Date Taking? Authorizing Provider  acetaminophen (TYLENOL) 500 MG tablet Take 2 tablets (1,000 mg total) by mouth every 6 (six) hours as needed for mild pain or fever. 08/09/19   Saverio Danker, PA-C  Ascorbic Acid (VITAMIN C) 100 MG tablet Take 100 mg by mouth daily.    [provider]  B Complex Vitamins (VITAMIN-B COMPLEX PO) Take by mouth daily.    [provider]  Calcium 150 MG TABS Take 150 mg by mouth daily.    [provider]  Cholecalciferol (VITAMIN D3) 10 MCG (400 UNIT) CAPS Take 400 Units by mouth daily.    [provider]  furosemide (LASIX) 40 MG tablet Take 1 tablet (40 mg total) by mouth daily. 06/02/20   Patwardhan, Reynold Bowen, MD  Garlic 130 MG TABS Take 100 mg by mouth daily.    [provider]  lactobacillus acidophilus (BACID) TABS tablet Take 1 tablet by mouth daily with breakfast. 1 hr before breakfast and 2 hours after meds    [provider]  LINOLEIC ACID-SUNFLOWER OIL PO Take 1 capsule by mouth daily.    [provider]  magnesium oxide (MAG-OX) 400 MG tablet Take 400 mg by mouth in the morning, at noon, and at bedtime.    [provider]  metoprolol succinate (TOPROL-XL) 50 MG 24 hr  tablet Take 1 tablet (50 mg total) by mouth daily. Take with or immediately following a meal. 08/10/20   Patwardhan, Manish J, MD  nitroGLYCERIN (NITROSTAT) 0.4 MG SL tablet Place 1 tablet (0.4 mg total) under the tongue every 5 (five) minutes as needed for chest pain. 03/11/20 06/09/20  Patwardhan, Reynold Bowen, MD  Omega-3 Fatty Acids (FISH OIL) 1000 MG CAPS Take 1,000 mg by mouth daily.    [provider]  thyroid (ARMOUR THYROID) 120 MG tablet Take 1 tablet (120 mg total) by mouth daily before breakfast. 08/02/19   Forrest Moron, MD  Turmeric 500 MG CAPS Take 500 mg by mouth daily.    [provider]  Vitamin E 400 units TABS Take 400 Units by mouth daily.    [provider]  warfarin (COUMADIN) 5 MG tablet Take 1 tablet (5 mg total) by mouth daily. Refer to most recent anticoagulation note for most accurate dosing instructions. 01/16/20   Patwardhan, [employer]  J, MD    Physical Exam: Vitals:   08/29/20 1618 08/29/20 1715 08/29/20 1800 08/29/20 1900  BP:  114/72 127/72 124/73  Pulse:  90 90 87  Resp:  16 16 16   Temp:      TempSrc:      SpO2: 100% 100% 99% 99%   Constitutional: Resting supine in bed, NAD, calm, comfortable Eyes: PERRL, lids and conjunctivae normal ENMT: Mucous membranes are moist. Posterior pharynx clear of any exudate or lesions.Normal dentition. Neck: normal, supple, no masses. Respiratory: clear to auscultation bilaterally, no wheezing, no crackles. Normal respiratory effort. No accessory muscle use.  Cardiovascular: Irregularly irregular, no murmurs / rubs / gallops.  Slight nonpitting swelling of the left ankle, no edema right leg. 2+ pedal pulses. Abdomen: Slightly distended but soft abdomen with no tenderness. No hepatosplenomegaly. Bowel sounds positive.  Musculoskeletal: no clubbing / cyanosis. No joint deformity upper and lower extremities. Good ROM, no contractures. Normal muscle tone.  Skin: no rashes, lesions, ulcers. No  induration Neurologic: CN 2-12 grossly intact. Sensation intact. Strength 5/5 in all 4.  Psychiatric: Normal judgment and insight. Alert and oriented x 3. Normal mood.   Labs on Admission: I have personally reviewed following labs and imaging studies  CBC: Recent Labs  Lab 08/29/20 1339  WBC 10.4  HGB 11.7*  HCT 35.2*  MCV 90.3  PLT 979*   Basic Metabolic Panel: Recent Labs  Lab 08/29/20 1339  NA 127*  K 4.0  CL 88*  CO2 28  GLUCOSE 106*  BUN 33*  CREATININE 1.01*  CALCIUM 8.8*   GFR: CrCl cannot be calculated (Unknown ideal weight.). Liver Function Tests: No results for input(s): AST, ALT, ALKPHOS, BILITOT, PROT, ALBUMIN in the last 168 hours. No results for input(s): LIPASE, AMYLASE in the last 168 hours. No results for input(s): AMMONIA in the last 168 hours. Coagulation Profile: Recent Labs  Lab 08/29/20 1630  INR 7.2*   Cardiac Enzymes: No results for input(s): CKTOTAL, CKMB, CKMBINDEX, TROPONINI in the last 168 hours. BNP (last 3 results) No results for input(s): PROBNP in the last 8760 hours. HbA1C: No results for input(s): HGBA1C in the last 72 hours. CBG: No results for input(s): GLUCAP in the last 168 hours. Lipid Profile: No results for input(s): CHOL, HDL, LDLCALC, TRIG, CHOLHDL, LDLDIRECT in the last 72 hours. Thyroid Function Tests: No results for input(s): TSH, T4TOTAL, FREET4, T3FREE, THYROIDAB in the last 72 hours. Anemia Panel: No results for input(s): VITAMINB12, FOLATE, FERRITIN, TIBC, IRON, RETICCTPCT in the last 72 hours. Urine analysis:    Component Value Date/Time   COLORURINE YELLOW 08/29/2020 1335   APPEARANCEUR CLEAR 08/29/2020 1335   LABSPEC 1.006 08/29/2020 1335   PHURINE 6.0 08/29/2020 1335   GLUCOSEU NEGATIVE 08/29/2020 1335   HGBUR SMALL (A) 08/29/2020 1335   BILIRUBINUR NEGATIVE 08/29/2020 1335   KETONESUR NEGATIVE 08/29/2020 1335   PROTEINUR NEGATIVE 08/29/2020 1335   NITRITE NEGATIVE 08/29/2020 1335   LEUKOCYTESUR  NEGATIVE 08/29/2020 1335    Radiological Exams on Admission: DG Chest 2 View  Result Date: 08/29/2020 CLINICAL DATA:  Chest pain with intermittent shortness of breath and generalized weakness for 2 weeks. History of hypertension and congestive heart failure. EXAM: CHEST - 2 VIEW COMPARISON:  Radiographs 08/24/2020.  CT 02/01/2020. FINDINGS: Persistent low lung volumes with associated bibasilar atelectasis. Subpleural nodule posteriorly in the right lower lobe appears grossly stable, although is partly obscured by the hemidiaphragm on the frontal examination. There is mild atelectasis at both lung bases. The  heart size and mediastinal contours are stable with aortic atherosclerosis. No pleural effusion pneumothorax. Degenerative changes are present throughout the thoracic spine with a chronic T12 compression deformity. IMPRESSION: No acute findings or significant changes compared with prior radiographs. Persistent low lung volumes with bibasilar atelectasis. Grossly stable subpleural nodule posteriorly in the right lower lobe, suboptimally evaluated radiographically. Electronically Signed   By: Richardean Sale M.D.   On: 08/29/2020 14:12   CT CHEST ABDOMEN PELVIS W CONTRAST  Result Date: 08/29/2020 CLINICAL DATA:  Shortness of breath with abdominal distension. History pleural-based right lung mass and chronic intra-abdominal fluid collection. EXAM: CT CHEST, ABDOMEN, AND PELVIS WITH CONTRAST TECHNIQUE: Multidetector CT imaging of the chest, abdomen and pelvis was performed following the standard protocol during bolus administration of intravenous contrast. CONTRAST:  84mL OMNIPAQUE IOHEXOL 300 MG/ML  SOLN COMPARISON:  Radiographs today.  CTs 02/01/2020 and 10/19/2019. FINDINGS: CT CHEST FINDINGS Cardiovascular: Diffuse atherosclerosis of the aorta, great vessels and coronary arteries. No acute vascular findings are seen. There is stable mild central enlargement of the pulmonary arteries, mild calcifications  of the aortic valve and mild cardiomegaly. No significant residual pericardial fluid. Mediastinum/Nodes: There are no enlarged mediastinal, hilar or axillary lymph nodes. Small mediastinal and hilar lymph nodes appear unchanged. No significant abnormality of the thyroid gland, trachea or esophagus. Lungs/Pleura: There is no pleural effusion or pneumothorax. Mild centrilobular emphysema and central airway thickening. The previously demonstrated subpleural nodule in the right lower lobe has significantly enlarged in the interval, now measuring up to 4.5 x 3.5 cm on image 88/5. This previously measured 3.2 x 1.2 cm. Currently, this mass demonstrates heterogeneous enhancement and probable central necrosis, highly suspicious for a primary bronchogenic carcinoma. No new nodules are seen. Musculoskeletal/Chest wall: No chest wall mass or suspicious osseous lesions. There are old rib fractures on the right. Probable sebaceous cyst in the left anterior chest wall measuring 16 mm on image 10/3 is unchanged. CT ABDOMEN AND PELVIS FINDINGS Hepatobiliary: No suspicious hepatic lesions are identified. There is a probable small cyst anteriorly in the right lobe on image 48/3. There is probable passive congestion within the liver. A tiny calcified gallstone is present without gallbladder wall thickening or biliary dilatation. No evidence of gallstones, gallbladder wall thickening or biliary dilatation. Pancreas: Unremarkable. No pancreatic ductal dilatation or surrounding inflammatory changes. Spleen: Normal in size without focal abnormality. Adrenals/Urinary Tract: Stable enlargement of the left adrenal gland, likely reflecting hyperplasia. The right adrenal gland appears normal. Stable bilateral renal cysts. No evidence of enhancing renal mass, urinary tract calculus or hydronephrosis. The bladder appears normal for its degree of distention. Stomach/Bowel: No enteric contrast administered. The stomach is decompressed without  apparent abnormality. No evidence of bowel wall thickening, distension or surrounding inflammatory change. No primary bowel malignancy identified. Vascular/Lymphatic: A presumed lymph node in the right ileocolonic mesentery measuring 1.9 x 1.7 cm is unchanged (image 74/3). No progressive adenopathy. Diffuse aortic and branch vessel atherosclerosis without aneurysm or large vessel occlusion. Reproductive: The uterus and ovaries appear normal. No adnexal mass. Other: Interval development of a moderate amount of ascites throughout the peritoneal cavity. There is extensive peritoneal and omental soft tissue nodularity, especially in the left upper quadrant where there is an ill-defined soft tissue mass measuring approximately 7.7 x 3.5 cm on image 49/3. There is anterior omental nodularity measuring 1.3 cm on image 74/3 and 2.3 cm on image 79/3. Nodularity extends into the umbilicus. There is a right lower quadrant peritoneal nodule measuring 2.5 cm  on image 85/3. Complex right lower quadrant fluid collection measures 5.8 x 4.5 cm on image 79/3, similar to previous CT. Musculoskeletal: No acute or significant osseous findings. Stable mild chronic compression deformities, most notable at T12 and L3. IMPRESSION: 1. Significant interval enlargement of right lower lobe lung mass, appearing centrally necrotic and suspicious for primary bronchogenic carcinoma. 2. Interval development of ascites with widespread peritoneal nodularity highly suspicious for peritoneal carcinomatosis. Several of these peritoneal nodules should be amenable to percutaneous tissue sampling. 3. The complex right lower quadrant fluid collection has not significantly changed from previous CT of 6 months ago and may reflect a postsurgical fluid collection post appendectomy. 4. No primary bowel or ovarian malignancy identified. No evidence of bowel or ureteral obstruction. 5. Coronary and Aortic Atherosclerosis (ICD10-I70.0). Electronically Signed   By:  Richardean Sale M.D.   On: 08/29/2020 17:47    EKG: Personally reviewed. Atrial fibrillation with PVCs.  Not significantly changed when compared to prior.  Assessment/Plan Principal Problem:   Supratherapeutic INR Active Problems:   Permanent atrial fibrillation (HCC)   Hypothyroidism   Chronic kidney disease (CKD), stage III (moderate) (HCC)   Chronic combined systolic and diastolic CHF (congestive heart failure) ([facility]CC)   Right lower lobe lung mass   Hyponatremia   Ashley Dunn is a 87 y.o. 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  Received oral vitamin K 2.5 mg once in the ED.  Hold Coumadin and repeat INR in a.m.  Currently rate controlled, continue Toprol-XL.  Exertional dyspnea: Likely multifactorial from lung mass and abdominal ascites.  Received IV Lasix 40 mg once with approximately 1 L urine output.  Will give additional dose of IV Lasix 40 mg in the morning and monitor progress.  Rectal bleeding: Suspect hemorrhoidal bleeding in setting of supratherapeutic INR.  Hemoglobin currently stable.  Holding Coumadin as above.  Right lower lobe lung mass concerning for primary bronchogenic carcinoma with peritoneal carcinomatosis: Seen on CT chest/abdomen/pelvis.  Findings discussed with patient.  Further work-up can likely be pursued on an outpatient basis pending improvement in INR.  Hyponatremia: Mild and likely related to hypervolemia with ascites.  Receiving IV Lasix.  Repeat labs in AM.  Chronic combined systolic and diastolic CHF: Receiving IV Lasix as above for ascites but otherwise does not  appear to be decompensated from CHF standpoint.  Continue Toprol-XL, monitor strict I/O's and daily weights.  CKD stage IIIa: Monitor with diuresis.  Hypothyroidism: Continue [place].  DVT prophylaxis: SCDs Code Status: DNR, confirmed with patient Family Communication: Discussed with patient, she has discussed with family Disposition Plan: From home and likely discharge home pending clinical progress Consults called: None Level of care: Telemetry Cardiac Admission status:  Status is: Observation  The patient remains OBS appropriate and will d/c before 2 midnights.  Dispo: The patient is from: Home              Anticipated d/c is to: Home              Patient currently is not medically stable to d/c.   Difficult to place patient No  Zada Finders MD [facility]  If 7PM-7AM, please contact night-coverage [email or web address]  08/29/2020, 7:29 PM

## 2020-08-29 NOTE — ED Provider Notes (Signed)
MSE was initiated and I personally evaluated the patient and placed orders (if any) at  1:32 PM on Aug 29, 2020.  The patient appears stable so that the remainder of the MSE may be completed by another provider.   Chief Complaint: sob, weakness   HPI:   Patient states that she started having shortness of breath over the last 2 weeks in addition to generalized weakness.  Denies any pain anywhere.  History of CHF, a fib.  Patient is on Coumadin.   ROS: sob, weakness    Physical Exam:  Gen:                Awake, no distress  HEENT:          Atraumatic  Resp:              Normal effort  Cardiac:          Normal rate  Abd:                Nondistended, nontender  MSK:              Moves extremities without difficulty  Neuro:            Speech clear,EOMS intact      Initiation of care has begun. The patient has been counseled on the process, plan, and necessity for staying for the completion/evaluation, and the remainder of the medical screening examination    Alfredia Client, PA-C 08/29/20 1334    Daleen Bo, MD 08/29/20 2036

## 2020-08-30 DIAGNOSIS — R32 Unspecified urinary incontinence: Secondary | ICD-10-CM | POA: Diagnosis present

## 2020-08-30 DIAGNOSIS — Z79899 Other long term (current) drug therapy: Secondary | ICD-10-CM | POA: Diagnosis not present

## 2020-08-30 DIAGNOSIS — Z7901 Long term (current) use of anticoagulants: Secondary | ICD-10-CM | POA: Diagnosis not present

## 2020-08-30 DIAGNOSIS — Z515 Encounter for palliative care: Secondary | ICD-10-CM

## 2020-08-30 DIAGNOSIS — N183 Chronic kidney disease, stage 3 unspecified: Secondary | ICD-10-CM | POA: Diagnosis not present

## 2020-08-30 DIAGNOSIS — I313 Pericardial effusion (noninflammatory): Secondary | ICD-10-CM | POA: Diagnosis present

## 2020-08-30 DIAGNOSIS — I4821 Permanent atrial fibrillation: Secondary | ICD-10-CM | POA: Diagnosis present

## 2020-08-30 DIAGNOSIS — Z7189 Other specified counseling: Secondary | ICD-10-CM | POA: Diagnosis not present

## 2020-08-30 DIAGNOSIS — Z20822 Contact with and (suspected) exposure to covid-19: Secondary | ICD-10-CM | POA: Diagnosis present

## 2020-08-30 DIAGNOSIS — E871 Hypo-osmolality and hyponatremia: Secondary | ICD-10-CM | POA: Diagnosis present

## 2020-08-30 DIAGNOSIS — R791 Abnormal coagulation profile: Secondary | ICD-10-CM | POA: Diagnosis present

## 2020-08-30 DIAGNOSIS — E039 Hypothyroidism, unspecified: Secondary | ICD-10-CM | POA: Diagnosis present

## 2020-08-30 DIAGNOSIS — I5043 Acute on chronic combined systolic (congestive) and diastolic (congestive) heart failure: Secondary | ICD-10-CM | POA: Diagnosis present

## 2020-08-30 DIAGNOSIS — C3491 Malignant neoplasm of unspecified part of right bronchus or lung: Secondary | ICD-10-CM | POA: Diagnosis present

## 2020-08-30 DIAGNOSIS — I130 Hypertensive heart and chronic kidney disease with heart failure and stage 1 through stage 4 chronic kidney disease, or unspecified chronic kidney disease: Secondary | ICD-10-CM | POA: Diagnosis present

## 2020-08-30 DIAGNOSIS — K625 Hemorrhage of anus and rectum: Secondary | ICD-10-CM | POA: Diagnosis present

## 2020-08-30 DIAGNOSIS — R0600 Dyspnea, unspecified: Secondary | ICD-10-CM | POA: Diagnosis present

## 2020-08-30 DIAGNOSIS — R0602 Shortness of breath: Secondary | ICD-10-CM | POA: Diagnosis present

## 2020-08-30 DIAGNOSIS — C786 Secondary malignant neoplasm of retroperitoneum and peritoneum: Secondary | ICD-10-CM | POA: Diagnosis present

## 2020-08-30 DIAGNOSIS — R180 Malignant ascites: Secondary | ICD-10-CM | POA: Diagnosis present

## 2020-08-30 DIAGNOSIS — Z9049 Acquired absence of other specified parts of digestive tract: Secondary | ICD-10-CM | POA: Diagnosis not present

## 2020-08-30 DIAGNOSIS — I5042 Chronic combined systolic (congestive) and diastolic (congestive) heart failure: Secondary | ICD-10-CM

## 2020-08-30 DIAGNOSIS — Y92009 Unspecified place in unspecified non-institutional (private) residence as the place of occurrence of the external cause: Secondary | ICD-10-CM | POA: Diagnosis not present

## 2020-08-30 DIAGNOSIS — I4892 Unspecified atrial flutter: Secondary | ICD-10-CM | POA: Diagnosis present

## 2020-08-30 DIAGNOSIS — Z66 Do not resuscitate: Secondary | ICD-10-CM | POA: Diagnosis present

## 2020-08-30 DIAGNOSIS — Z85038 Personal history of other malignant neoplasm of large intestine: Secondary | ICD-10-CM | POA: Diagnosis not present

## 2020-08-30 DIAGNOSIS — N1831 Chronic kidney disease, stage 3a: Secondary | ICD-10-CM | POA: Diagnosis present

## 2020-08-30 DIAGNOSIS — Z823 Family history of stroke: Secondary | ICD-10-CM | POA: Diagnosis not present

## 2020-08-30 DIAGNOSIS — Z8249 Family history of ischemic heart disease and other diseases of the circulatory system: Secondary | ICD-10-CM | POA: Diagnosis not present

## 2020-08-30 DIAGNOSIS — K649 Unspecified hemorrhoids: Secondary | ICD-10-CM | POA: Diagnosis present

## 2020-08-30 LAB — BASIC METABOLIC PANEL
Anion gap: 9 (ref 5–15)
BUN: 30 mg/dL — ABNORMAL HIGH (ref 8–23)
CO2: 32 mmol/L (ref 22–32)
Calcium: 8.5 mg/dL — ABNORMAL LOW (ref 8.9–10.3)
Chloride: 91 mmol/L — ABNORMAL LOW (ref 98–111)
Creatinine, Ser: 1.08 mg/dL — ABNORMAL HIGH (ref 0.44–1.00)
GFR, Estimated: 48 mL/min — ABNORMAL LOW (ref >60)
Glucose, Bld: 107 mg/dL — ABNORMAL HIGH (ref 70–99)
Potassium: 3.5 mmol/L (ref 3.5–5.1)
Sodium: 132 mmol/L — ABNORMAL LOW (ref 135–145)

## 2020-08-30 LAB — CBC
HCT: 33.3 % — ABNORMAL LOW (ref 36.0–46.0)
Hemoglobin: 10.9 g/dL — ABNORMAL LOW (ref 12.0–15.0)
MCH: 29.5 pg (ref 26.0–34.0)
MCHC: 32.7 g/dL (ref 30.0–36.0)
MCV: 90 fL (ref 80.0–100.0)
Platelets: 394 10*3/uL (ref 150–400)
RBC: 3.7 MIL/uL — ABNORMAL LOW (ref 3.87–5.11)
RDW: 14.6 % (ref 11.5–15.5)
WBC: 9.2 10*3/uL (ref 4.0–10.5)
nRBC: 0 % (ref 0.0–0.2)

## 2020-08-30 LAB — PROTIME-INR
INR: 7 (ref 0.8–1.2)
INR: 3.8 — ABNORMAL HIGH (ref 0.8–1.2)
Prothrombin Time: 60.5 seconds — ABNORMAL HIGH (ref 11.4–15.2)
Prothrombin Time: 37.8 seconds — ABNORMAL HIGH (ref 11.4–15.2)

## 2020-08-30 MED ORDER — FUROSEMIDE 10 MG/ML IJ SOLN
40.0000 mg | Freq: Every day | INTRAMUSCULAR | Status: DC
  Administered 2020-08-31 – 2020-09-01 (×2): 40 mg via INTRAVENOUS
  Filled 2020-08-30 (×2): qty 4

## 2020-08-30 NOTE — Consult Note (Addendum)
Palliative Medicine Inpatient Consult Note  Reason for consult:  "goals of care, metastatic cancer"  HPI:  Per intake H&P --> Ashley Dunn is a 87 y.o. 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  Late of care was asked to get involved to aid in goals of care conversations in the setting of significant metastatic cancer.  Clinical Assessment/Goals of Care:  *Please note that this is a verbal dictation therefore any spelling or grammatical errors are due to the "[place]" system interpretation.  I have reviewed medical records including EPIC notes, labs and imaging, received report from bedside RN, assessed the patient who is in no distress and was appropriate in conversation   I met with Ashley Dunn to further discuss diagnosis prognosis, GOC, EOL wishes, disposition and options.   I introduced Palliative Medicine as specialized medical care for people living with serious illness. It focuses on providing relief from the symptoms and stress of a serious illness. The goal is to improve quality of life for both the patient and the family.  Ashley Dunn shares with me that she is from [name] originally.  She moved here 2 years ago to be closer to her daughter Ashley Dunn.  She is a widow and had been married for 73 years.  She has 5 children, 16 grandchildren and 4 great-grandchildren.  She used to work as a Primary school teacher. For hobbies she enjoys shopping she is a woman of faith and practices within [employer]ism.  Prior to admission Ashley Dunn shares with me that she has been less ambulatory.  She used to walk about 15 loops around her neighborhood daily that has been reduced over time to 5 loops.  She expresses that  mobilizing is harder and she feels more short of breath with it.  She has been able to perform basic activities of daily living for herself.  She utilizes a cane for walking.  [place] shares with me that she knows that she had her appendix out last year and unfortunately cancer was identified at that time.  A detailed discussion was had today regarding advanced directives -patient does not have any on file though she does share with me that her daughter Ashley Dunn would be her decision maker if for any reason she were incapacitated.    Concepts specific to code status, artifical feeding and hydration, continued IV antibiotics and rehospitalization was had.  Needed is DO NOT RESUSCITATE DO NOT INTUBATE CODE STATUS.  She would not wish for heroic measures to sustain life.    The difference between a aggressive medical intervention path  and a palliative comfort care path for this patient at this time was had.  I shared with Ashley Dunn that based upon her laboratory results, radiographic findings and physical decline it would be very reasonable at this time to consider hospice care for her metastatic cancer.  She expresses to me that she does not want to make that decision now and will want to further understand what her treatment options are.  I I Shared with Ashley Dunn that if there are no treatment options the best measure we have to maintain appropriate symptom management would be hospice and would aid to support her best at her end-of-life process.    This conversation appeared overwhelming to Ashley Dunn.  We decided to  have further conversations when her daughter arrived later this afternoon.  Discussed the importance of continued conversation with family and their  medical providers regarding overall plan of care and treatment options, ensuring decisions are within the context of the patients values and GOCs. ______________________________________________________________________ Addendum:  Met this afternoon with  Ashley Dunn at bedside.  It appeared that Ashley Dunn was in a better headspace to further discuss her terminal prognosis of bronchogenic carcinoma.  Reviewed that she has metastatic disease and I worry that any options for treatment would cause her tremendous amount of physical discord without tremendous benefit.  Ashley Dunn shares with me that she would not want treatment as she is "87 years old and has lived a good life".  We discussed what home hospice would look like.  Both Ashley Dunn and her daughter inquire as to whether moved to [place] would be feasible given the rapidity of the cancer spread.  I shared that if Ashley Dunn wants to move I would recommend doing that sooner than later and then advance coordinating with the hospice company closer to their home in [place].  It seems that the move in and of itself may be placed on hold.  Ashley Dunn and her daughter are interested in speaking to one of the local hospice agencies.  Ashley Dunn shares that they live in the [place] area and [facility] would likely be a good fit for them.  I shared with him that I will coordinate a meeting to help clarify what services could be provided in the home.  Decision Maker: Ashley Dunn (Spouse) [phone]  SUMMARY OF RECOMMENDATIONS   DNAR/DNI  Appreciate transitions of care team coordinating with hospice of the [facility] for meeting  Anticipate Ashley Dunn will discharge home with hospice services though she and her daughter would like to first meet the agency and gather more information from them pertaining to the services they are able to provide  Spiritual - Patient requests [facility] for [facility]  Palliative care will continue to aid in supporting this patient  Code Status/Advance Care Planning: DNAR/DNI  Palliative Prophylaxis:   Care, mobility,  Psycho-social/Spiritual:   Desire for further Chaplaincy support: Yes  Additional Recommendations:  Education on metastatic disease  processes   Prognosis:  Prognosis is very poor limited likely to months in the setting of adenocarcinoma.  Discharge Planning:  Discharge likely home possibly with hospice care.  Vitals:   08/30/20 0549 08/30/20 0720  BP: 131/70 (!) 104/58  Pulse: 100 94  Resp: 18 18  Temp: 97.9 F (36.6 C) 97.7 F (36.5 C)  SpO2:  90%    Intake/Output Summary (Last 24 hours) at 08/30/2020 1328 Last data filed at 08/30/2020 1000 Gross per 24 hour  Intake 530 ml  Output 1150 ml  Net -620 ml   Last Weight  Most recent update: 08/30/2020  5:51 AM   Weight  71.8 kg (158 lb 6.4 oz)           Gen: Elderly Caucasian female in no acute distress HEENT: moist mucous membranes CV: Irregular rate and rhythm PULM: On 2 L nasal cannula ABD: soft/nontender EXT: No edema Neuro: Alert and oriented x3  PPS: 50%   This conversation/these recommendations were discussed with patient primary care team, Dr. Karleen Hampshire  Time In: 1230 Time Out: 1400 Total Time: 90 Greater than 50%  of this time was spent counseling and coordinating care related to the above assessment and plan.  [place] Palliative Medicine Team Team  Cell Phone: [phone] Please utilize secure chat with additional questions, if there is no response within 30 minutes please call the above phone number  Palliative Medicine Team providers are available by phone from 7am to 7pm daily and can be reached through the team cell phone.  Should this patient require assistance outside of these hours, please call the patient's attending physician.

## 2020-08-30 NOTE — Progress Notes (Signed)
PROGRESS NOTE    Lorilei Horan  ZYS:[id number] DOB: February 29, 1926 DOA: 08/29/2020 PCP: [name], MD    Chief Complaint  Patient presents with  . Shortness of Breath  . Weakness    Brief Narrative: Edita Weyenberg is a 87 y.o. 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   CT chest/abdomen/pelvis with contrast showed interval enlargement of right lower lobe lung mass concerning for primary bronchogenic carcinoma.  Interval development of ascites with widespread peritoneal nodularity highly suspicious for peritoneal carcinomatosis also noted.    Discussed the results with the patient and the daughter over the phone.  In view of her advanced age and multiple comorbidities, palliative care consulted for goals of care discussion.  Further management include either biopsy of the peritoneal LN and outpatient follow up with oncology vs home with hospice without any treatment.  The patient and daughter are currently thinking about options.  Meanwhile. She was found to have supra therapeutic INR , was give vitamin K last night.  Assessment & Plan:   Principal Problem:   Supratherapeutic INR Active Problems:   Permanent atrial fibrillation (HCC)   Hypothyroidism   Chronic kidney disease (CKD), stage III (moderate) (HCC)   Chronic combined systolic and diastolic CHF (congestive heart failure) (HCC)   Right lower lobe lung mass   Hyponatremia   Atrial fibrillation with RVR:  - rate controlled.  - on coumadin for anti coagulation, supra therapeutic INR.  - vit K given repeat labs tonight.  - coumadin on hold.   Exertional dyspnea  Multifactorial probably from chronic combined systolic and diastolic heart failure and  bronchogenic ca vs ascites.  Continue with lasix.  Family will let us know about the plans regarding bronchogenic ca.     Hypothyroidism: Resume synthroid.   Mild rectal bleeding : Possibly from ? Supra therapeutic INR.  None since admission.  Monitor hemoglobin.      DVT prophylaxis: coumadin  Code Status: DNR.  Family Communication: discussed with daughter over the phone.  Disposition:   Status is: Observation  The patient will require care spanning > 2 midnights and should be moved to inpatient because: Ongoing diagnostic testing needed not appropriate for outpatient work up and IV treatments appropriate due to intensity of illness or inability to take PO  Dispo: The patient is from: Home              Anticipated d/c is to: Home              Patient currently is not medically stable to d/c.   Difficult to place patient No       Consultants:   Palliative care.    Procedures:  CTA and Pelvis.    Antimicrobials: none.    Subjective: She reports feeling better than yesterday, wants to get some sleep.   Objective: Vitals:   08/29/20 2100 08/29/20 2143 08/30/20 0549 08/30/20 0720  BP: 124/70 115/83 131/70 (!) 104/58  Pulse: 89 96 100 94  Resp: 20 18 18 18   Temp: 97.8 F (36.6 C) 99.2 F (37.3 C) 97.9 F (36.6 C) 97.7 F (36.5 C)  TempSrc: Oral Oral Oral Oral  SpO2: 98% 91%  90%  Weight:  72.3 kg 71.8 kg   Height:  5' (1.524 m)  Intake/Output Summary (Last 24 hours) at 08/30/2020 1828 Last data filed at 08/30/2020 1000 Gross per 24 hour  Intake 520 ml  Output 1150 ml  Net -630 ml   Filed Weights   08/29/20 2143 08/30/20 0549  Weight: 72.3 kg 71.8 kg    Examination:  General exam: Appears calm and comfortable  Respiratory system: diminished at bases, on 2 lit of [place] oxygen.  Cardiovascular system: S1 & S2 heard, RRR. No JVD, murmurs,. No pedal edema. Gastrointestinal system: Abdomen is soft, ,mildly distended.  Central nervous system:  Alert and oriented. No focal neurological deficits. Extremities: Symmetric 5 x 5 power. Skin: No rashes, lesions or ulcers Psychiatry:  Mood & affect appropriate.     Data Reviewed: I have personally reviewed following labs and imaging studies  CBC: Recent Labs  Lab 08/29/20 1339 08/30/20 0128  WBC 10.4 9.2  HGB 11.7* 10.9*  HCT 35.2* 33.3*  MCV 90.3 90.0  PLT 432* 161    Basic Metabolic Panel: Recent Labs  Lab 08/29/20 1339 08/30/20 0128  NA 127* 132*  K 4.0 3.5  CL 88* 91*  CO2 28 32  GLUCOSE 106* 107*  BUN 33* 30*  CREATININE 1.01* 1.08*  CALCIUM 8.8* 8.5*    GFR: Estimated Creatinine Clearance: 29.4 mL/min (A) (by C-G formula based on SCr of 1.08 mg/dL (H)).  Liver Function Tests: Recent Labs  Lab 08/29/20 1630  AST 25  ALT 16  ALKPHOS 76  BILITOT 0.6  PROT 6.5  ALBUMIN 2.2*    CBG: No results for input(s): GLUCAP in the last 168 hours.   Recent Results (from the past 240 hour(s))  Resp Panel by RT-PCR (Flu A&B, Covid) Nasopharyngeal Swab     Status: None   Collection Time: 08/29/20  9:04 PM   Specimen: Nasopharyngeal Swab; Nasopharyngeal(NP) swabs in vial transport medium  Result Value Ref Range Status   SARS Coronavirus 2 by RT PCR NEGATIVE NEGATIVE Final    Comment: (NOTE) SARS-CoV-2 target nucleic acids are NOT DETECTED.  The SARS-CoV-2 RNA is generally detectable in upper respiratory specimens during the acute phase of infection. The lowest concentration of SARS-CoV-2 viral copies this assay can detect is 138 copies/mL. A negative result does not preclude SARS-Cov-2 infection and should not be used as the sole basis for treatment or other patient management decisions. A negative result may occur with  improper specimen collection/handling, submission of specimen other than nasopharyngeal swab, presence of viral mutation(s) within the areas targeted by this assay, and inadequate number of viral copies(<138 copies/mL). A negative result  must be combined with clinical observations, patient history, and epidemiological information. The expected result is Negative.  Fact Sheet for Patients:  EntrepreneurPulse.com.au  Fact Sheet for Healthcare Providers:  IncredibleEmployment.be  This test is no t yet approved or cleared by the [place] FDA and  has been authorized for detection and/or diagnosis of SARS-CoV-2 by FDA under an Emergency Use Authorization (EUA). This EUA will remain  in effect (meaning this test can be used) for the duration of the COVID-19 declaration under Section 564(b)(1) of the Act, 21 U.S.C.section 360bbb-3(b)(1), unless the authorization is terminated  or revoked sooner.       Influenza A by PCR NEGATIVE NEGATIVE Final   Influenza B by PCR NEGATIVE NEGATIVE Final    Comment: (NOTE) The Xpert Xpress SARS-CoV-2/FLU/RSV plus assay is intended as an aid in the diagnosis of influenza from Nasopharyngeal swab specimens and should not be used as a sole basis for  treatment. Nasal washings and aspirates are unacceptable for Xpert Xpress SARS-CoV-2/FLU/RSV testing.  Fact Sheet for Patients: EntrepreneurPulse.com.au  Fact Sheet for Healthcare Providers: IncredibleEmployment.be  This test is not yet approved or cleared by the [place] FDA and has been authorized for detection and/or diagnosis of SARS-CoV-2 by FDA under an Emergency Use Authorization (EUA). This EUA will remain in effect (meaning this test can be used) for the duration of the COVID-19 declaration under Section 564(b)(1) of the Act, 21 U.S.C. section 360bbb-3(b)(1), unless the authorization is terminated or revoked.  Performed at [facility], [place] [address], [place], [place] [address]          Radiology Studies: DG Chest 2 View  Result Date: 08/29/2020 CLINICAL DATA:  Chest pain with intermittent shortness of breath and generalized weakness  for 2 weeks. History of hypertension and congestive heart failure. EXAM: CHEST - 2 VIEW COMPARISON:  Radiographs 08/24/2020.  CT 02/01/2020. FINDINGS: Persistent low lung volumes with associated bibasilar atelectasis. Subpleural nodule posteriorly in the right lower lobe appears grossly stable, although is partly obscured by the hemidiaphragm on the frontal examination. There is mild atelectasis at both lung bases. The heart size and mediastinal contours are stable with aortic atherosclerosis. No pleural effusion pneumothorax. Degenerative changes are present throughout the thoracic spine with a chronic T12 compression deformity. IMPRESSION: No acute findings or significant changes compared with prior radiographs. Persistent low lung volumes with bibasilar atelectasis. Grossly stable subpleural nodule posteriorly in the right lower lobe, suboptimally evaluated radiographically. Electronically Signed   By: Richardean Sale M.D.   On: 08/29/2020 14:12   CT CHEST ABDOMEN PELVIS W CONTRAST  Result Date: 08/29/2020 CLINICAL DATA:  Shortness of breath with abdominal distension. History pleural-based right lung mass and chronic intra-abdominal fluid collection. EXAM: CT CHEST, ABDOMEN, AND PELVIS WITH CONTRAST TECHNIQUE: Multidetector CT imaging of the chest, abdomen and pelvis was performed following the standard protocol during bolus administration of intravenous contrast. CONTRAST:  51mL OMNIPAQUE IOHEXOL 300 MG/ML  SOLN COMPARISON:  Radiographs today.  CTs 02/01/2020 and 10/19/2019. FINDINGS: CT CHEST FINDINGS Cardiovascular: Diffuse atherosclerosis of the aorta, great vessels and coronary arteries. No acute vascular findings are seen. There is stable mild central enlargement of the pulmonary arteries, mild calcifications of the aortic valve and mild cardiomegaly. No significant residual pericardial fluid. Mediastinum/Nodes: There are no enlarged mediastinal, hilar or axillary lymph nodes. Small mediastinal and hilar  lymph nodes appear unchanged. No significant abnormality of the thyroid gland, trachea or esophagus. Lungs/Pleura: There is no pleural effusion or pneumothorax. Mild centrilobular emphysema and central airway thickening. The previously demonstrated subpleural nodule in the right lower lobe has significantly enlarged in the interval, now measuring up to 4.5 x 3.5 cm on image 88/5. This previously measured 3.2 x 1.2 cm. Currently, this mass demonstrates heterogeneous enhancement and probable central necrosis, highly suspicious for a primary bronchogenic carcinoma. No new nodules are seen. Musculoskeletal/Chest wall: No chest wall mass or suspicious osseous lesions. There are old rib fractures on the right. Probable sebaceous cyst in the left anterior chest wall measuring 16 mm on image 10/3 is unchanged. CT ABDOMEN AND PELVIS FINDINGS Hepatobiliary: No suspicious hepatic lesions are identified. There is a probable small cyst anteriorly in the right lobe on image 48/3. There is probable passive congestion within the liver. A tiny calcified gallstone is present without gallbladder wall thickening or biliary dilatation. No evidence of gallstones, gallbladder wall thickening or biliary dilatation. Pancreas: Unremarkable. No pancreatic ductal dilatation or surrounding  inflammatory changes. Spleen: Normal in size without focal abnormality. Adrenals/Urinary Tract: Stable enlargement of the left adrenal gland, likely reflecting hyperplasia. The right adrenal gland appears normal. Stable bilateral renal cysts. No evidence of enhancing renal mass, urinary tract calculus or hydronephrosis. The bladder appears normal for its degree of distention. Stomach/Bowel: No enteric contrast administered. The stomach is decompressed without apparent abnormality. No evidence of bowel wall thickening, distension or surrounding inflammatory change. No primary bowel malignancy identified. Vascular/Lymphatic: A presumed lymph node in the right  ileocolonic mesentery measuring 1.9 x 1.7 cm is unchanged (image 74/3). No progressive adenopathy. Diffuse aortic and branch vessel atherosclerosis without aneurysm or large vessel occlusion. Reproductive: The uterus and ovaries appear normal. No adnexal mass. Other: Interval development of a moderate amount of ascites throughout the peritoneal cavity. There is extensive peritoneal and omental soft tissue nodularity, especially in the left upper quadrant where there is an ill-defined soft tissue mass measuring approximately 7.7 x 3.5 cm on image 49/3. There is anterior omental nodularity measuring 1.3 cm on image 74/3 and 2.3 cm on image 79/3. Nodularity extends into the umbilicus. There is a right lower quadrant peritoneal nodule measuring 2.5 cm on image 85/3. Complex right lower quadrant fluid collection measures 5.8 x 4.5 cm on image 79/3, similar to previous CT. Musculoskeletal: No acute or significant osseous findings. Stable mild chronic compression deformities, most notable at T12 and L3. IMPRESSION: 1. Significant interval enlargement of right lower lobe lung mass, appearing centrally necrotic and suspicious for primary bronchogenic carcinoma. 2. Interval development of ascites with widespread peritoneal nodularity highly suspicious for peritoneal carcinomatosis. Several of these peritoneal nodules should be amenable to percutaneous tissue sampling. 3. The complex right lower quadrant fluid collection has not significantly changed from previous CT of 6 months ago and may reflect a postsurgical fluid collection post appendectomy. 4. No primary bowel or ovarian malignancy identified. No evidence of bowel or ureteral obstruction. 5. Coronary and Aortic Atherosclerosis (ICD10-I70.0). Electronically Signed   By: Richardean Sale M.D.   On: 08/29/2020 17:47        Scheduled Meds: . metoprolol succinate  50 mg Oral Daily  . thyroid  120 mg Oral QAC breakfast   Continuous Infusions:   LOS: 0 days         Hosie Poisson, MD [facility]   To contact the attending provider between 7A-7P or the covering provider during after hours 7P-7A, please log into the web site [email or web address] and access using universal [place] password for that web site. If you do not have the password, please call the hospital operator.  08/30/2020, 6:28 PM

## 2020-08-31 DIAGNOSIS — E039 Hypothyroidism, unspecified: Secondary | ICD-10-CM

## 2020-08-31 DIAGNOSIS — E871 Hypo-osmolality and hyponatremia: Secondary | ICD-10-CM

## 2020-08-31 LAB — CBC WITH DIFFERENTIAL/PLATELET
Abs Immature Granulocytes: 0.07 10*3/uL (ref 0.00–0.07)
Basophils Absolute: 0 10*3/uL (ref 0.0–0.1)
Basophils Relative: 0 %
Eosinophils Absolute: 0.1 10*3/uL (ref 0.0–0.5)
Eosinophils Relative: 1 %
HCT: 35.8 % — ABNORMAL LOW (ref 36.0–46.0)
Hemoglobin: 11.9 g/dL — ABNORMAL LOW (ref 12.0–15.0)
Immature Granulocytes: 1 %
Lymphocytes Relative: 9 %
Lymphs Abs: 1 10*3/uL (ref 0.7–4.0)
MCH: 29.9 pg (ref 26.0–34.0)
MCHC: 33.2 g/dL (ref 30.0–36.0)
MCV: 89.9 fL (ref 80.0–100.0)
Monocytes Absolute: 1.2 10*3/uL — ABNORMAL HIGH (ref 0.1–1.0)
Monocytes Relative: 12 %
Neutro Abs: 8.2 10*3/uL — ABNORMAL HIGH (ref 1.7–7.7)
Neutrophils Relative %: 77 %
Platelets: 380 10*3/uL (ref 150–400)
RBC: 3.98 MIL/uL (ref 3.87–5.11)
RDW: 14.7 % (ref 11.5–15.5)
WBC: 10.5 10*3/uL (ref 4.0–10.5)
nRBC: 0 % (ref 0.0–0.2)

## 2020-08-31 LAB — BASIC METABOLIC PANEL
Anion gap: 10 (ref 5–15)
BUN: 41 mg/dL — ABNORMAL HIGH (ref 8–23)
CO2: 29 mmol/L (ref 22–32)
Calcium: 8.8 mg/dL — ABNORMAL LOW (ref 8.9–10.3)
Chloride: 91 mmol/L — ABNORMAL LOW (ref 98–111)
Creatinine, Ser: 1.22 mg/dL — ABNORMAL HIGH (ref 0.44–1.00)
GFR, Estimated: 42 mL/min — ABNORMAL LOW (ref >60)
Glucose, Bld: 114 mg/dL — ABNORMAL HIGH (ref 70–99)
Potassium: 4 mmol/L (ref 3.5–5.1)
Sodium: 130 mmol/L — ABNORMAL LOW (ref 135–145)

## 2020-08-31 LAB — PROTIME-INR
INR: 2.9 — ABNORMAL HIGH (ref 0.8–1.2)
Prothrombin Time: 30.3 seconds — ABNORMAL HIGH (ref 11.4–15.2)

## 2020-08-31 NOTE — Progress Notes (Signed)
PROGRESS NOTE    Ashley Dunn  IDP:[id number] DOB: [birth year] DOA: 08/29/2020 PCP: [name], MD    Chief Complaint  Patient presents with  . Shortness of Breath  . Weakness    Brief Narrative: Ashley Dunn is a 87 y.o. 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   CT chest/abdomen/pelvis with contrast showed interval enlargement of right lower lobe lung mass concerning for primary bronchogenic carcinoma.  Interval development of ascites with widespread peritoneal nodularity highly suspicious for peritoneal carcinomatosis also noted.    Discussed the results with the patient and the daughter over the phone.  In view of her advanced age and multiple comorbidities, palliative care consulted for goals of care discussion.  Further management include either biopsy of the peritoneal LN and outpatient follow up with oncology vs home with hospice without any treatment.  The patient and daughter are currently thinking about options.  Meanwhile. She was found to have supra therapeutic INR , was give vitamin K last night.  Repeat INR has improved.  Assessment & Plan:   Principal Problem:   Supratherapeutic INR Active Problems:   Permanent atrial fibrillation (HCC)   Hypothyroidism   Chronic kidney disease (CKD), stage III (moderate) (HCC)   Chronic combined systolic and diastolic CHF (congestive heart failure) (HCC)   Right lower lobe lung mass   Hyponatremia   SOB (shortness of breath)   Atrial fibrillation with RVR:  - rate controlled.  - on coumadin for anti coagulation, therapeutic INR today.  - restart the coumadin.   Exertional dyspnea  Multifactorial probably from chronic combined systolic and  diastolic heart failure and bronchogenic ca vs ascites. Pt currently on 2 lit of [place] oxygen.  Patient and daughter is talking to the hospice and plan to decide soon.  Lasix held for mild worsening of creatinine.     Hypothyroidism: Resume synthroid.   Mild rectal bleeding : Possibly from ? Supra therapeutic INR.  None since admission.  Hemoglobin stable around 11.    Stage 3 a CKD;  Slight worsening of creatinine from lasix.  Monitor creatinine   Hyponatremia:  Probably from fluid overload.        DVT prophylaxis: coumadin  Code Status: DNR.  Family Communication: discussed with daughter over the phone.  Disposition:   Status is: Observation  The patient will require care spanning > 2 midnights and should be moved to inpatient because: Ongoing diagnostic testing needed not appropriate for outpatient work up and IV treatments appropriate due to intensity of illness or inability to take PO  Dispo: The patient is from: Home              Anticipated d/c is to: Home              Patient currently is not medically stable to d/c.   Difficult to place patient No       Consultants:   Palliative care.    Procedures:  CTA and Pelvis.    Antimicrobials: none.    Subjective: No new complaints.   Objective: Vitals:   08/30/20 1952 08/31/20 0422 08/31/20 0852 08/31/20 1008  BP: 115/63 109/77 114/66 122/63  Pulse: 80 84 87 82  Resp: 20 18 18    Temp: 98.2 F (36.8 C) 97.9 F (36.6 C)  97.8 F (36.6 C)   TempSrc: Oral Oral Oral   SpO2: 97% 97% 96%   Weight:      Height:        Intake/Output Summary (Last 24 hours) at 08/31/2020 1321 Last data filed at 08/31/2020 1200 Gross per 24 hour  Intake 120 ml  Output 600 ml  Net -480 ml   Filed Weights   08/29/20 2143 08/30/20 0549  Weight: 72.3 kg 71.8 kg    Examination:  General exam: Elderly lady, not in any kind of distress appears comfortable Respiratory system: Air entry fair, no wheezing or  rhonchi Cardiovascular system: S1-S2 heard regular rate rhythm, no JVD Gastrointestinal system: Abdomen is soft, nontender bowel sounds normal Central nervous system: Alert and oriented Extremities: No cyanosis Skin: No rashes seen Psychiatry: Mood is appropriate    Data Reviewed: I have personally reviewed following labs and imaging studies  CBC: Recent Labs  Lab 08/29/20 1339 08/30/20 0128 08/31/20 0645  WBC 10.4 9.2 10.5  NEUTROABS  --   --  8.2*  HGB 11.7* 10.9* 11.9*  HCT 35.2* 33.3* 35.8*  MCV 90.3 90.0 89.9  PLT 432* 394 185    Basic Metabolic Panel: Recent Labs  Lab 08/29/20 1339 08/30/20 0128 08/31/20 0645  NA 127* 132* 130*  K 4.0 3.5 4.0  CL 88* 91* 91*  CO2 28 32 29  GLUCOSE 106* 107* 114*  BUN 33* 30* 41*  CREATININE 1.01* 1.08* 1.22*  CALCIUM 8.8* 8.5* 8.8*    GFR: Estimated Creatinine Clearance: 26.1 mL/min (A) (by C-G formula based on SCr of 1.22 mg/dL (H)).  Liver Function Tests: Recent Labs  Lab 08/29/20 1630  AST 25  ALT 16  ALKPHOS 76  BILITOT 0.6  PROT 6.5  ALBUMIN 2.2*    CBG: No results for input(s): GLUCAP in the last 168 hours.   Recent Results (from the past 240 hour(s))  Resp Panel by RT-PCR (Flu A&B, Covid) Nasopharyngeal Swab     Status: None   Collection Time: 08/29/20  9:04 PM   Specimen: Nasopharyngeal Swab; Nasopharyngeal(NP) swabs in vial transport medium  Result Value Ref Range Status   SARS Coronavirus 2 by RT PCR NEGATIVE NEGATIVE Final    Comment: (NOTE) SARS-CoV-2 target nucleic acids are NOT DETECTED.  The SARS-CoV-2 RNA is generally detectable in upper respiratory specimens during the acute phase of infection. The lowest concentration of SARS-CoV-2 viral copies this assay can detect is 138 copies/mL. A negative result does not preclude SARS-Cov-2 infection and should not be used as the sole basis for treatment or other patient management decisions. A negative result may occur with  improper specimen  collection/handling, submission of specimen other than nasopharyngeal swab, presence of viral mutation(s) within the areas targeted by this assay, and inadequate number of viral copies(<138 copies/mL). A negative result must be combined with clinical observations, patient history, and epidemiological information. The expected result is Negative.  Fact Sheet for Patients:  EntrepreneurPulse.com.au  Fact Sheet for Healthcare Providers:  IncredibleEmployment.be  This test is no t yet approved or cleared by the [place] FDA and  has been authorized for detection and/or diagnosis of SARS-CoV-2 by FDA under an Emergency Use Authorization (EUA). This EUA will remain  in effect (meaning this test can be used) for the duration of the COVID-19 declaration under Section 564(b)(1) of the Act, 21 U.S.C.section 360bbb-3(b)(1), unless the authorization is terminated  or revoked sooner.       Influenza A by PCR NEGATIVE NEGATIVE  Final   Influenza B by PCR NEGATIVE NEGATIVE Final    Comment: (NOTE) The Xpert Xpress SARS-CoV-2/FLU/RSV plus assay is intended as an aid in the diagnosis of influenza from Nasopharyngeal swab specimens and should not be used as a sole basis for treatment. Nasal washings and aspirates are unacceptable for Xpert Xpress SARS-CoV-2/FLU/RSV testing.  Fact Sheet for Patients: EntrepreneurPulse.com.au  Fact Sheet for Healthcare Providers: IncredibleEmployment.be  This test is not yet approved or cleared by the [place] FDA and has been authorized for detection and/or diagnosis of SARS-CoV-2 by FDA under an Emergency Use Authorization (EUA). This EUA will remain in effect (meaning this test can be used) for the duration of the COVID-19 declaration under Section 564(b)(1) of the Act, 21 U.S.C. section 360bbb-3(b)(1), unless the authorization is terminated or revoked.  Performed at [facility], [place] [address], [place], [place] [address]          Radiology Studies: DG Chest 2 View  Result Date: 08/29/2020 CLINICAL DATA:  Chest pain with intermittent shortness of breath and generalized weakness for 2 weeks. History of hypertension and congestive heart failure. EXAM: CHEST - 2 VIEW COMPARISON:  Radiographs 08/24/2020.  CT 02/01/2020. FINDINGS: Persistent low lung volumes with associated bibasilar atelectasis. Subpleural nodule posteriorly in the right lower lobe appears grossly stable, although is partly obscured by the hemidiaphragm on the frontal examination. There is mild atelectasis at both lung bases. The heart size and mediastinal contours are stable with aortic atherosclerosis. No pleural effusion pneumothorax. Degenerative changes are present throughout the thoracic spine with a chronic T12 compression deformity. IMPRESSION: No acute findings or significant changes compared with prior radiographs. Persistent low lung volumes with bibasilar atelectasis. Grossly stable subpleural nodule posteriorly in the right lower lobe, suboptimally evaluated radiographically. Electronically Signed   By: Richardean Sale M.D.   On: 08/29/2020 14:12   CT CHEST ABDOMEN PELVIS W CONTRAST  Result Date: 08/29/2020 CLINICAL DATA:  Shortness of breath with abdominal distension. History pleural-based right lung mass and chronic intra-abdominal fluid collection. EXAM: CT CHEST, ABDOMEN, AND PELVIS WITH CONTRAST TECHNIQUE: Multidetector CT imaging of the chest, abdomen and pelvis was performed following the standard protocol during bolus administration of intravenous contrast. CONTRAST:  36mL OMNIPAQUE IOHEXOL 300 MG/ML  SOLN COMPARISON:  Radiographs today.  CTs 02/01/2020 and 10/19/2019. FINDINGS: CT CHEST FINDINGS Cardiovascular: Diffuse atherosclerosis of the aorta, great vessels and coronary arteries. No acute vascular findings are seen. There is stable mild central enlargement of the pulmonary  arteries, mild calcifications of the aortic valve and mild cardiomegaly. No significant residual pericardial fluid. Mediastinum/Nodes: There are no enlarged mediastinal, hilar or axillary lymph nodes. Small mediastinal and hilar lymph nodes appear unchanged. No significant abnormality of the thyroid gland, trachea or esophagus. Lungs/Pleura: There is no pleural effusion or pneumothorax. Mild centrilobular emphysema and central airway thickening. The previously demonstrated subpleural nodule in the right lower lobe has significantly enlarged in the interval, now measuring up to 4.5 x 3.5 cm on image 88/5. This previously measured 3.2 x 1.2 cm. Currently, this mass demonstrates heterogeneous enhancement and probable central necrosis, highly suspicious for a primary bronchogenic carcinoma. No new nodules are seen. Musculoskeletal/Chest wall: No chest wall mass or suspicious osseous lesions. There are old rib fractures on the right. Probable sebaceous cyst in the left anterior chest wall measuring 16 mm on image 10/3 is unchanged. CT ABDOMEN AND PELVIS FINDINGS Hepatobiliary: No suspicious hepatic lesions are identified. There is a probable small cyst anteriorly in  the right lobe on image 48/3. There is probable passive congestion within the liver. A tiny calcified gallstone is present without gallbladder wall thickening or biliary dilatation. No evidence of gallstones, gallbladder wall thickening or biliary dilatation. Pancreas: Unremarkable. No pancreatic ductal dilatation or surrounding inflammatory changes. Spleen: Normal in size without focal abnormality. Adrenals/Urinary Tract: Stable enlargement of the left adrenal gland, likely reflecting hyperplasia. The right adrenal gland appears normal. Stable bilateral renal cysts. No evidence of enhancing renal mass, urinary tract calculus or hydronephrosis. The bladder appears normal for its degree of distention. Stomach/Bowel: No enteric contrast administered. The  stomach is decompressed without apparent abnormality. No evidence of bowel wall thickening, distension or surrounding inflammatory change. No primary bowel malignancy identified. Vascular/Lymphatic: A presumed lymph node in the right ileocolonic mesentery measuring 1.9 x 1.7 cm is unchanged (image 74/3). No progressive adenopathy. Diffuse aortic and branch vessel atherosclerosis without aneurysm or large vessel occlusion. Reproductive: The uterus and ovaries appear normal. No adnexal mass. Other: Interval development of a moderate amount of ascites throughout the peritoneal cavity. There is extensive peritoneal and omental soft tissue nodularity, especially in the left upper quadrant where there is an ill-defined soft tissue mass measuring approximately 7.7 x 3.5 cm on image 49/3. There is anterior omental nodularity measuring 1.3 cm on image 74/3 and 2.3 cm on image 79/3. Nodularity extends into the umbilicus. There is a right lower quadrant peritoneal nodule measuring 2.5 cm on image 85/3. Complex right lower quadrant fluid collection measures 5.8 x 4.5 cm on image 79/3, similar to previous CT. Musculoskeletal: No acute or significant osseous findings. Stable mild chronic compression deformities, most notable at T12 and L3. IMPRESSION: 1. Significant interval enlargement of right lower lobe lung mass, appearing centrally necrotic and suspicious for primary bronchogenic carcinoma. 2. Interval development of ascites with widespread peritoneal nodularity highly suspicious for peritoneal carcinomatosis. Several of these peritoneal nodules should be amenable to percutaneous tissue sampling. 3. The complex right lower quadrant fluid collection has not significantly changed from previous CT of 6 months ago and may reflect a postsurgical fluid collection post appendectomy. 4. No primary bowel or ovarian malignancy identified. No evidence of bowel or ureteral obstruction. 5. Coronary and Aortic Atherosclerosis  (ICD10-I70.0). Electronically Signed   By: Richardean Sale M.D.   On: 08/29/2020 17:47        Scheduled Meds: . furosemide  40 mg Intravenous Daily  . metoprolol succinate  50 mg Oral Daily  . thyroid  120 mg Oral QAC breakfast   Continuous Infusions:   LOS: 1 day        Hosie Poisson, MD [facility]   To contact the attending provider between 7A-7P or the covering provider during after hours 7P-7A, please log into the web site [email or web address] and access using universal [place] password for that web site. If you do not have the password, please call the hospital operator.  08/31/2020, 1:21 PM

## 2020-08-31 NOTE — Progress Notes (Addendum)
This chaplain responded to PMT consult for spiritual care and honoring the Pt. request for a Catholic priest to serve the [place].  This chaplain left a VM for Father Barnabas Lister with a request for F/U with the PMT. The chaplain updated RN-Amor at the Pt. bedside with the plans.  The chaplain understands from the Pt. discharge may be coming soon.    RN follow up information about Pt. d/c will be helpful in scheduling a priest visit.  The chaplain understands returning to [place] to be present with family is a priority for the Pt. The Pt. easily reflects on her [place] alongside the meaning and responsibilities of family in her story.   The chaplain will F/U with the Pt. and RN with any plans for a priest visit.  **[phone] Father Barnabas Lister anticipates visit for communion today around 3-4pm  **[id number]  This chaplain updated the Pt. RN-Linda of Father Jack's arrival in about 30 minutes.

## 2020-08-31 NOTE — Progress Notes (Signed)
   Followed up with the pt's daughter to discuss hospice options at home and how this would look. We discussed goals, interdisciplinary team approach, and the pt's illness. The daughter [name] stated she will be going to the hospital this afternoon and is hoping that her mother will feel like discussing the plans for d/c and her illness further. She will update me once they have made a decision regarding treatment vs no treatment and comfort approach.   Webb Silversmith RN BSN [facility] [phone]

## 2020-08-31 NOTE — Progress Notes (Signed)
CSW received consult for [place] to call family to help with questions regarding home hospice to assist  patient and family determine dc plan. CSW called [facility] and spoke with [name] who confirmed she spoke with patients daughter and answered questions. Daughter is going to discuss with patient. Plan to be determined. CSW will continue to follow.

## 2020-09-01 LAB — BASIC METABOLIC PANEL
Anion gap: 7 (ref 5–15)
BUN: 46 mg/dL — ABNORMAL HIGH (ref 8–23)
CO2: 32 mmol/L (ref 22–32)
Calcium: 8.3 mg/dL — ABNORMAL LOW (ref 8.9–10.3)
Chloride: 91 mmol/L — ABNORMAL LOW (ref 98–111)
Creatinine, Ser: 1 mg/dL (ref 0.44–1.00)
GFR, Estimated: 53 mL/min — ABNORMAL LOW (ref >60)
Glucose, Bld: 154 mg/dL — ABNORMAL HIGH (ref 70–99)
Potassium: 3.5 mmol/L (ref 3.5–5.1)
Sodium: 130 mmol/L — ABNORMAL LOW (ref 135–145)

## 2020-09-01 LAB — PROTIME-INR
INR: 2.7 — ABNORMAL HIGH (ref 0.8–1.2)
Prothrombin Time: 29 seconds — ABNORMAL HIGH (ref 11.4–15.2)

## 2020-09-01 MED ORDER — WARFARIN SODIUM 2.5 MG PO TABS
2.5000 mg | ORAL_TABLET | Freq: Once | ORAL | Status: AC
  Administered 2020-09-01: 2.5 mg via ORAL
  Filled 2020-09-01: qty 1

## 2020-09-01 MED ORDER — WARFARIN - PHARMACIST DOSING INPATIENT: Freq: Every day | Status: DC

## 2020-09-01 NOTE — Progress Notes (Signed)
PROGRESS NOTE    Ashley Dunn  STM:[id number] DOB: 1925/06/07 DOA: 08/29/2020 PCP: [name], MD    Chief Complaint  Patient presents with  . Shortness of Breath  . Weakness    Brief Narrative: Ashley Dunn is a 87 y.o. 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   CT chest/abdomen/pelvis with contrast showed interval enlargement of right lower lobe lung mass concerning for primary bronchogenic carcinoma.  Interval development of ascites with widespread peritoneal nodularity highly suspicious for peritoneal carcinomatosis also noted.    Discussed the results with the patient and the daughter over the phone.  In view of her advanced age and multiple comorbidities, palliative care consulted for goals of care discussion.  Further management include either biopsy of the peritoneal LN and outpatient follow up with oncology vs home with hospice without any treatment.  The patient and daughter are currently thinking about options. They would like to proceed with home with hospice and the patient does not want any biopsies for work up .  Meanwhile. She was found to have supra therapeutic INR on admission , was give vitamin K and repeat INR is therapeutic.    Pt seen and examined today. She reports not a good night, very disturbed sleep.  Denies any sob or chest pain.  Wants to know when she can go home.    Assessment & Plan:   Principal Problem:   Supratherapeutic INR Active Problems:   Permanent atrial fibrillation (HCC)   Hypothyroidism   Chronic kidney disease (CKD), stage III (moderate) (HCC)   Chronic combined systolic and diastolic CHF (congestive heart failure) (HCC)   Right lower lobe lung mass    Hyponatremia   SOB (shortness of breath)   Atrial fibrillation with RVR:   rate controlled with metoprolol. - on coumadin for anti coagulation, therapeutic INR today.  - restarted the coumadin.   Exertional dyspnea  Multifactorial probably from  Acute on chronic combined systolic and diastolic heart failure and bronchogenic ca vs ascites. Pt currently on 2 lit of [place] oxygen.  Patient and daughter is talking to the  Home hospice and plan to decide soon.  Lasix held for mild worsening of creatinine.  Repeat BMP pending today.    Hypothyroidism: Resume synthroid.   Mild rectal bleeding : Possibly from ? Supra therapeutic INR vs hemorrhoids.  None since admission.  Hemoglobin stable around 11.    Stage 3 a CKD;  Slight worsening of creatinine from lasix.  Monitor creatinine today.   Hyponatremia:  Probably from fluid overload.  Recheck sodium today.       DVT prophylaxis: coumadin  Code Status: DNR.  Family Communication: discussed with daughter over the phone on 09/01/20 Disposition:   Status is: Inpatient.   The patient will require care spanning > 2 midnights and should be moved to inpatient because: Ongoing diagnostic testing needed not appropriate for outpatient work up and IV treatments appropriate due to intensity of illness or inability to take PO  Dispo: The patient is from: Home              Anticipated d/c is to: Home              Patient currently is not medically stable to d/c.   Difficult to place patient  No       Consultants:   Palliative care.    Procedures:  CTA and Pelvis.    Antimicrobials: none.    Subjective: Not feeling good, did not have a good night sleep.  Wants to know what the plan is for discharge.   Objective: Vitals:   08/31/20 1444 08/31/20 2000 09/01/20 0430 09/01/20 0823  BP: 116/63 126/76 116/77 109/73  Pulse: 84 90 88 98  Resp: 18 18 18 18   Temp: (!) 97.4 F (36.3 C) (!) 97.5 F (36.4 C) (!) 97.1 F (36.2 C)  97.7 F (36.5 C)  TempSrc: Oral Oral Axillary Oral  SpO2: 96% 96% 98% 98%  Weight:   73.9 kg   Height:        Intake/Output Summary (Last 24 hours) at 09/01/2020 1200 Last data filed at 09/01/2020 0815 Gross per 24 hour  Intake 600 ml  Output 500 ml  Net 100 ml   Filed Weights   08/29/20 2143 08/30/20 0549 09/01/20 0430  Weight: 72.3 kg 71.8 kg 73.9 kg    Examination:  General exam: Elderly lady alert and appears comfortable not in any kind of distress.  Respiratory system: Air entry fair bilateral, no wheezing or rhonchi on 2 L of nasal cannula oxygen. Cardiovascular system: S1-S2 heard, regular rate rhythm, no pedal edema Gastrointestinal system: Abdomen is soft nontender nondistended bowel sounds normal Central nervous system: Alert and able to answer simple questions Extremities: No cyanosis  skin: No rashes seen Psychiatry: Mood is appropriate    Data Reviewed: I have personally reviewed following labs and imaging studies  CBC: Recent Labs  Lab 08/29/20 1339 08/30/20 0128 08/31/20 0645  WBC 10.4 9.2 10.5  NEUTROABS  --   --  8.2*  HGB 11.7* 10.9* 11.9*  HCT 35.2* 33.3* 35.8*  MCV 90.3 90.0 89.9  PLT 432* 394 701    Basic Metabolic Panel: Recent Labs  Lab 08/29/20 1339 08/30/20 0128 08/31/20 0645  NA 127* 132* 130*  K 4.0 3.5 4.0  CL 88* 91* 91*  CO2 28 32 29  GLUCOSE 106* 107* 114*  BUN 33* 30* 41*  CREATININE 1.01* 1.08* 1.22*  CALCIUM 8.8* 8.5* 8.8*    GFR: Estimated Creatinine Clearance: 26.4 mL/min (A) (by C-G formula based on SCr of 1.22 mg/dL (H)).  Liver Function Tests: Recent Labs  Lab 08/29/20 1630  AST 25  ALT 16  ALKPHOS 76  BILITOT 0.6  PROT 6.5  ALBUMIN 2.2*    CBG: No results for input(s): GLUCAP in the last 168 hours.   Recent Results (from the past 240 hour(s))  Resp Panel by RT-PCR (Flu A&B, Covid) Nasopharyngeal Swab     Status: None   Collection Time: 08/29/20  9:04 PM   Specimen: Nasopharyngeal Swab;  Nasopharyngeal(NP) swabs in vial transport medium  Result Value Ref Range Status   SARS Coronavirus 2 by RT PCR NEGATIVE NEGATIVE Final    Comment: (NOTE) SARS-CoV-2 target nucleic acids are NOT DETECTED.  The SARS-CoV-2 RNA is generally detectable in upper respiratory specimens during the acute phase of infection. The lowest concentration of SARS-CoV-2 viral copies this assay can detect is 138 copies/mL. A negative result does not preclude SARS-Cov-2 infection and should not be used as the sole basis for treatment or other patient management decisions. A negative result may occur with  improper specimen collection/handling, submission of specimen other than nasopharyngeal swab, presence of viral mutation(s) within the areas targeted by this assay, and inadequate number of viral  copies(<138 copies/mL). A negative result must be combined with clinical observations, patient history, and epidemiological information. The expected result is Negative.  Fact Sheet for Patients:  EntrepreneurPulse.com.au  Fact Sheet for Healthcare Providers:  IncredibleEmployment.be  This test is no t yet approved or cleared by the [place] FDA and  has been authorized for detection and/or diagnosis of SARS-CoV-2 by FDA under an Emergency Use Authorization (EUA). This EUA will remain  in effect (meaning this test can be used) for the duration of the COVID-19 declaration under Section 564(b)(1) of the Act, 21 U.S.C.section 360bbb-3(b)(1), unless the authorization is terminated  or revoked sooner.       Influenza A by PCR NEGATIVE NEGATIVE Final   Influenza B by PCR NEGATIVE NEGATIVE Final    Comment: (NOTE) The Xpert Xpress SARS-CoV-2/FLU/RSV plus assay is intended as an aid in the diagnosis of influenza from Nasopharyngeal swab specimens and should not be used as a sole basis for treatment. Nasal washings and aspirates are unacceptable for Xpert Xpress  SARS-CoV-2/FLU/RSV testing.  Fact Sheet for Patients: EntrepreneurPulse.com.au  Fact Sheet for Healthcare Providers: IncredibleEmployment.be  This test is not yet approved or cleared by the [place] FDA and has been authorized for detection and/or diagnosis of SARS-CoV-2 by FDA under an Emergency Use Authorization (EUA). This EUA will remain in effect (meaning this test can be used) for the duration of the COVID-19 declaration under Section 564(b)(1) of the Act, 21 U.S.C. section 360bbb-3(b)(1), unless the authorization is terminated or revoked.  Performed at [facility], [place] [address] [place], [place] [address]          Radiology Studies: No results found.      Scheduled Meds: . metoprolol succinate  50 mg Oral Daily  . thyroid  120 mg Oral QAC breakfast   Continuous Infusions:   LOS: 2 days        Hosie Poisson, MD [facility]   To contact the attending provider between 7A-7P or the covering provider during after hours 7P-7A, please log into the web site [email or web address] and access using universal [place] password for that web site. If you do not have the password, please call the hospital operator.  09/01/2020, 12:00 PM

## 2020-09-01 NOTE — TOC Progression Note (Signed)
Transition of Care [facility]) - Progression Note    Patient Details  Name: Ashley Dunn MRN: [id number] Date of Birth: 1925-07-15  Transition of Care [facility]) CM/SW [place], [place] Phone Number: 09/01/2020, 2:08 PM  Clinical Narrative:      CSW received call from [name] with [name]. [name] confirmed that patients daughter confirmed patient wants to go home with hospice. [name] is going to call CSW once everything is set up in the home. CSW called patients daughter [name] who confirmed plan. CSW informed MD. CSW will continue to follow and assist with discharge planning needs.      Expected Discharge Plan and Services                                                 Social Determinants of Health (SDOH) Interventions    Readmission Risk Interventions Readmission Risk Prevention Plan 10/23/2019  Transportation Screening Complete  PCP or Specialist Appt within 3-5 Days Complete  HRI or [place] Complete  Social Work Consult for [place] Planning/Counseling Complete  Palliative Care Screening Not Applicable  Medication Review Press photographer) Complete

## 2020-09-01 NOTE — Progress Notes (Signed)
ANTICOAGULATION CONSULT NOTE - Initial Consult  Pharmacy Consult for warfarin Indication: atrial fibrillation  No Known Allergies  Patient Measurements: Height: 5' (152.4 cm) Weight: 73.9 kg (162 lb 14.4 oz) IBW/kg (Calculated) : 45.5   Vital Signs: Temp: 97.7 F (36.5 C) (05/10 0823) Temp Source: Oral (05/10 0823) BP: 109/73 (05/10 0823) Pulse Rate: 98 (05/10 0823)  Labs: Recent Labs    08/29/20 1339 08/29/20 1536 08/29/20 1630 08/30/20 0128 08/30/20 1851 08/31/20 0645 09/01/20 0304  HGB 11.7*  --   --  10.9*  --  11.9*  --   HCT 35.2*  --   --  33.3*  --  35.8*  --   PLT 432*  --   --  394  --  380  --   LABPROT  --   --    < > 60.5* 37.8* 30.3* 29.0*  INR  --   --    < > 7.0* 3.8* 2.9* 2.7*  CREATININE 1.01*  --   --  1.08*  --  1.22*  --   TROPONINIHS 15 17  --   --   --   --   --    < > = values in this interval not displayed.    Estimated Creatinine Clearance: 26.4 mL/min (A) (by C-G formula based on SCr of 1.22 mg/dL (H)).   Medical History: Past Medical History:  Diagnosis Date  . Arthritis   . Atrial fibrillation ([place])   . Cataract   . CHF (congestive heart failure) ([place])   . Hypertension   . Osteoporosis   . Thyroid disease     Medications:  Medications Prior to Admission  Medication Sig Dispense Refill Last Dose  . acetaminophen (TYLENOL) 500 MG tablet Take 2 tablets (1,000 mg total) by mouth every 6 (six) hours as needed for mild pain or fever.   08/29/2020 at Unknown time  . Ascorbic Acid (VITAMIN C) 100 MG tablet Take 100 mg by mouth 3 (three) times daily.   08/29/2020 at Unknown time  . B Complex Vitamins (VITAMIN-B COMPLEX PO) Take 1 tablet by mouth in the morning, at noon, and at bedtime.   08/29/2020 at Unknown time  . Bilberry, Vaccinium myrtillus, (BILBERRY EXTRACT PO) Take 1 tablet by mouth daily.   08/29/2020 at Unknown time  . Calcium 150 MG TABS Take 150 mg by mouth in the morning, at noon, and at bedtime.   08/29/2020 at Unknown time  .  Cholecalciferol (VITAMIN D3) 10 MCG (400 UNIT) CAPS Take 400 Units by mouth in the morning and at bedtime.   08/29/2020 at Unknown time  . Emu Oil OIL Apply 1 application topically daily.   08/29/2020 at Unknown time  . furosemide (LASIX) 40 MG tablet Take 1 tablet (40 mg total) by mouth daily. 90 tablet 1 08/29/2020 at Unknown time  . Garlic 122 MG TABS Take 100 mg by mouth daily.   08/29/2020 at Unknown time  . lactobacillus acidophilus (BACID) TABS tablet Take 1 tablet by mouth daily.   08/29/2020 at Unknown time  . LINOLEIC ACID-SUNFLOWER OIL PO Take 1 capsule by mouth daily.   08/29/2020 at Unknown time  . magnesium oxide (MAG-OX) 400 MG tablet Take 400 mg by mouth in the morning, at noon, and at bedtime.   08/29/2020 at Unknown time  . metoprolol succinate (TOPROL-XL) 50 MG 24 hr tablet Take 1 tablet (50 mg total) by mouth daily. Take with or immediately following a meal. (Patient taking differently: Take 50  mg by mouth daily.) 90 tablet 1 08/29/2020 at 0300  . nitroGLYCERIN (NITROSTAT) 0.4 MG SL tablet Place 1 tablet (0.4 mg total) under the tongue every 5 (five) minutes as needed for chest pain. 30 tablet 3 unk  . Omega-3 Fatty Acids (FISH OIL) 1000 MG CAPS Take 1,000 mg by mouth daily.   08/29/2020 at Unknown time  . OVER THE COUNTER MEDICATION Take 1 tablet by mouth daily. Collisonia root   08/29/2020 at Unknown time  . sodium chloride (OCEAN) 0.65 % SOLN nasal spray Place 1 spray into both nostrils daily.   08/29/2020 at Unknown time  . thyroid (ARMOUR THYROID) 120 MG tablet Take 1 tablet (120 mg total) by mouth daily before breakfast. 90 tablet 0 08/29/2020 at Unknown time  . Turmeric 500 MG CAPS Take 500 mg by mouth daily.   08/29/2020 at Unknown time  . Vitamin E 400 units TABS Take 400 Units by mouth daily.   08/29/2020 at Unknown time  . warfarin (COUMADIN) 5 MG tablet Take 1 tablet (5 mg total) by mouth daily. Refer to most recent anticoagulation note for most accurate dosing instructions. (Patient taking  differently: Take 5 mg by mouth daily.) 90 tablet 3 08/28/2020 at 1800   Scheduled:  . metoprolol succinate  50 mg Oral Daily  . thyroid  120 mg Oral QAC breakfast    Assessment: 87 yo female with afib and HF. She was on warfarin PTA for afib and this has been on hold due to a supratherapeutic INR (7.2 on 5/7 and 2.5mg  vitamin K po was given) and rectal bleeding that has now resolved. Pharmacy consulted to dose warfarin. Family is also considering goals of care -INR= 2.7  Home warfarin dose: 5mg /day (last dose taken was reported as 08/28/20)   Goal of Therapy:  INR 2-3 Monitor platelets by anticoagulation protocol: Yes   Plan:   -Warfarin 2.5mg  po today -Daily PT/INR  Hildred Laser, PharmD Clinical Pharmacist **Pharmacist phone directory can now be found on [email or web address] ([email or web address]).  Listed under [place].

## 2020-09-02 LAB — PROTIME-INR
INR: 2.7 — ABNORMAL HIGH (ref 0.8–1.2)
Prothrombin Time: 28.3 seconds — ABNORMAL HIGH (ref 11.4–15.2)

## 2020-09-02 MED ORDER — WARFARIN SODIUM 1 MG PO TABS: 1.0000 mg | ORAL_TABLET | Freq: Every day | ORAL | 0 refills | Status: AC

## 2020-09-02 MED ORDER — NITROGLYCERIN 0.4 MG SL SUBL: 0.4000 mg | SUBLINGUAL_TABLET | SUBLINGUAL | Status: DC | PRN

## 2020-09-02 MED ORDER — WARFARIN SODIUM 1 MG PO TABS: 1.0000 mg | ORAL_TABLET | Freq: Once | ORAL | Status: DC

## 2020-09-02 NOTE — Progress Notes (Signed)
ANTICOAGULATION CONSULT NOTE - Initial Consult  Pharmacy Consult for warfarin Indication: atrial fibrillation  No Known Allergies  Patient Measurements: Height: 5' (152.4 cm) Weight: 72.8 kg (160 lb 9.6 oz) IBW/kg (Calculated) : 45.5   Vital Signs: Temp: 98 F (36.7 C) (05/11 0444) Temp Source: Oral (05/11 0444) BP: 122/74 (05/11 0444) Pulse Rate: 70 (05/11 0444)  Labs: Recent Labs    08/31/20 0645 09/01/20 0304 09/01/20 1123 09/02/20 0200  HGB 11.9*  --   --   --   HCT 35.8*  --   --   --   PLT 380  --   --   --   LABPROT 30.3* 29.0*  --  28.3*  INR 2.9* 2.7*  --  2.7*  CREATININE 1.22*  --  1.00  --     Estimated Creatinine Clearance: 32 mL/min (by C-G formula based on SCr of 1 mg/dL).   Medical History: Past Medical History:  Diagnosis Date  . Arthritis   . Atrial fibrillation ([place])   . Cataract   . CHF (congestive heart failure) ([place])   . Hypertension   . Osteoporosis   . Thyroid disease     Medications:  Medications Prior to Admission  Medication Sig Dispense Refill Last Dose  . acetaminophen (TYLENOL) 500 MG tablet Take 2 tablets (1,000 mg total) by mouth every 6 (six) hours as needed for mild pain or fever.   08/29/2020 at Unknown time  . Ascorbic Acid (VITAMIN C) 100 MG tablet Take 100 mg by mouth 3 (three) times daily.   08/29/2020 at Unknown time  . B Complex Vitamins (VITAMIN-B COMPLEX PO) Take 1 tablet by mouth in the morning, at noon, and at bedtime.   08/29/2020 at Unknown time  . Bilberry, Vaccinium myrtillus, (BILBERRY EXTRACT PO) Take 1 tablet by mouth daily.   08/29/2020 at Unknown time  . Calcium 150 MG TABS Take 150 mg by mouth in the morning, at noon, and at bedtime.   08/29/2020 at Unknown time  . Cholecalciferol (VITAMIN D3) 10 MCG (400 UNIT) CAPS Take 400 Units by mouth in the morning and at bedtime.   08/29/2020 at Unknown time  . Emu Oil OIL Apply 1 application topically daily.   08/29/2020 at Unknown time  . furosemide (LASIX) 40 MG tablet Take  1 tablet (40 mg total) by mouth daily. 90 tablet 1 08/29/2020 at Unknown time  . Garlic 676 MG TABS Take 100 mg by mouth daily.   08/29/2020 at Unknown time  . lactobacillus acidophilus (BACID) TABS tablet Take 1 tablet by mouth daily.   08/29/2020 at Unknown time  . LINOLEIC ACID-SUNFLOWER OIL PO Take 1 capsule by mouth daily.   08/29/2020 at Unknown time  . magnesium oxide (MAG-OX) 400 MG tablet Take 400 mg by mouth in the morning, at noon, and at bedtime.   08/29/2020 at Unknown time  . metoprolol succinate (TOPROL-XL) 50 MG 24 hr tablet Take 1 tablet (50 mg total) by mouth daily. Take with or immediately following a meal. (Patient taking differently: Take 50 mg by mouth daily.) 90 tablet 1 08/29/2020 at 0300  . nitroGLYCERIN (NITROSTAT) 0.4 MG SL tablet Place 1 tablet (0.4 mg total) under the tongue every 5 (five) minutes as needed for chest pain. 30 tablet 3 unk  . Omega-3 Fatty Acids (FISH OIL) 1000 MG CAPS Take 1,000 mg by mouth daily.   08/29/2020 at Unknown time  . OVER THE COUNTER MEDICATION Take 1 tablet by mouth daily. Collisonia root  08/29/2020 at Unknown time  . sodium chloride (OCEAN) 0.65 % SOLN nasal spray Place 1 spray into both nostrils daily.   08/29/2020 at Unknown time  . thyroid (ARMOUR THYROID) 120 MG tablet Take 1 tablet (120 mg total) by mouth daily before breakfast. 90 tablet 0 08/29/2020 at Unknown time  . Turmeric 500 MG CAPS Take 500 mg by mouth daily.   08/29/2020 at Unknown time  . Vitamin E 400 units TABS Take 400 Units by mouth daily.   08/29/2020 at Unknown time  . warfarin (COUMADIN) 5 MG tablet Take 1 tablet (5 mg total) by mouth daily. Refer to most recent anticoagulation note for most accurate dosing instructions. (Patient taking differently: Take 5 mg by mouth daily.) 90 tablet 3 08/28/2020 at 1800   Scheduled:  . metoprolol succinate  50 mg Oral Daily  . thyroid  120 mg Oral QAC breakfast  . Warfarin - Pharmacist Dosing Inpatient   Does not apply q1600    Assessment: 87 yo  female with afib and HF. She was on warfarin PTA for afib and this has been on hold due to a supratherapeutic INR (7.2 on 5/7 and 2.5mg  vitamin K po was given) and rectal bleeding that has now resolved. Pharmacy consulted to dose warfarin. Family is also considering goals of care -INR= 2.7  Home warfarin dose: 5mg /day (last dose taken was reported as 08/28/20)   Goal of Therapy:  INR 2-3 Monitor platelets by anticoagulation protocol: Yes   Plan:   -Warfarin 1mg  po today (lower dose due to continued INR > 2 and warfarin just restarted 5/10) -Daily PT/INR  Hildred Laser, PharmD Clinical Pharmacist **Pharmacist phone directory can now be found on [email or web address] ([email or web address]).  Listed under [place].

## 2020-09-02 NOTE — Discharge Instructions (Signed)
Please obtain INR check on Friday 09/04/20 and bi-weekly on reduce Coumadin dose of 1 mg p.o. daily.

## 2020-09-02 NOTE — Progress Notes (Signed)
Heart Failure Navigator Progress Note  Assessed for Heart & Vascular TOC clinic readiness.  Unfortunately at this time the patient does not meet criteria due to plans to DC with home hospice.   Navigator available for reassessment of patient.   Pricilla Holm, RN, BSN Heart Failure Nurse Navigator [phone]

## 2020-09-02 NOTE — TOC Progression Note (Addendum)
Transition of Care [facility]) - Progression Note    Patient Details  Name: Ashley Dunn MRN: [id number] Date of Birth: Feb 14, 1926  Transition of Care [facility]) CM/SW [place], [place] Phone Number: 09/02/2020, 11:09 AM  Clinical Narrative:     CSW received call from [facility] with [name] who confirmed equipment is set up and ready at home for patient. CSW called patients daughter who confirmed she will pick patient up when ready for dc. Patients daughter confirmed she will bring oxygen tank  for patient to travel back home with. CSW will continue to follow and assist with discharge planning needs.         Expected Discharge Plan and Services                                                 Social Determinants of Health (SDOH) Interventions    Readmission Risk Interventions Readmission Risk Prevention Plan 10/23/2019  Transportation Screening Complete  PCP or Specialist Appt within 3-5 Days Complete  HRI or [place] Complete  Social Work Consult for [place] Planning/Counseling Complete  Palliative Care Screening Not Applicable  Medication Review Press photographer) Complete

## 2020-09-02 NOTE — TOC Transition Note (Addendum)
Transition of Care [facility]) - CM/SW Discharge Note   Patient Details  Name: Ashley Dunn MRN: [id number] Date of Birth: Sep 16, 1925  Transition of Care [facility]) CM/SW Contact:  Trula Ore, [place] Phone Number: 09/02/2020, 11:37 AM   Clinical Narrative:     Patient will DC to: Home with Hospice   Anticipated DC date: 09/02/2020  Family notified: [name]   Transport by: Daughter [name]   ?  Per MD patient ready for DC to home with hospice services . RN, patient, patient's family,Hillary with [facility], notified of DC. DNR signed by MD attached to patients DC packet.Patients daughter [name] to transport patient home.  CSW signing off.          Patient Goals and CMS Choice        Discharge Placement                       Discharge Plan and Services                                     Social Determinants of Health (SDOH) Interventions     Readmission Risk Interventions Readmission Risk Prevention Plan 10/23/2019  Transportation Screening Complete  PCP or Specialist Appt within 3-5 Days Complete  HRI or [place] Complete  Social Work Consult for [place] Planning/Counseling Complete  Palliative Care Screening Not Applicable  Medication Review Press photographer) Complete

## 2020-09-02 NOTE — Discharge Summary (Signed)
Physician Discharge Summary  Ashley Dunn JOA:[id number] DOB: 11/21/1925 DOA: 08/29/2020  PCP: [name], MD  Admit date: 08/29/2020 Discharge date: 09/02/2020  Admitted From: Home Disposition:  Home with hospice  Recommendations for Outpatient Follow-up:  1. Obtain INR check on 09/04/2020 then biweekly 2. Reduce Coumadin to 1 mg p.o. daily  Equipment/Devices: Oxygen  Discharge Condition: Stable but overall prognosis poor CODE STATUS: DNR Diet recommendation: Regular diet  History of present illness:  Ashley Dunn is a 87 year old female with past medical history significant for permanent atrial fibrillation/flutter on Coumadin, chronic combined systolic and diastolic congestive heart failure with last EF 35 to 40% by TTE 03/02/2020, CKD stage IIIa, hypothyroidism, appendiceal adenocarcinoma s/p appendectomy 2021 who presented to the ED with progressive shortness of breath.  Work-up in the ED with CT chest/abdomen/pelvis notable for interval enlargement of right lower lobe lung mass concerning for primary bronchogenic carcinoma.  Also interval development of ascites with widespread peritoneal nodularity suspicious for peritoneal carcinomatosis.  Further patient was noted to have a supratherapeutic INR of 7.2 and was given vitamin K.  Hospitalist service was consulted for further evaluation and management.  Hospital course:    Exertional dyspnea Right lung mass concerning for primary bronchogenic carcinoma Patient presenting to the ED with progressive shortness of breath.  Etiology likely multifactorial with acute on chronic combined systolic and diastolic congestive heart failure with worsening right lower lobe mass concerning for bronchiogenic carcinoma with associated ascites with likely peritoneal carcinomatosis.  Given patient's advanced age, multiple comorbidities; palliative care was consulted for assistance with goals of care.  Discussed with patient and daughter regarding aggressive  work-up to include biopsy, oncology follow-up versus a more comfort approach with hospice.  After further discussion, patient and daughter decided to forego any further aggressive care and focus on comfort.  Hospice was consulted and patient will be discharged with home hospice.  Supratherapeutic INR, POA INR on admission 7.2, patient was given vitamin K and the Coumadin was initially held during hospitalization.  Patient's INR drifted down to 2.7 at time of discharge.  Discussed with daughter, wants to continue with Coumadin therapy outpatient.  Discussed with pharmacist and given her significantly elevated INR, recommend reduce dose of Coumadin to 1 mg p.o. daily.  Recommend INR check on Friday, 09/04/2020 and then biweekly thereafter.  Permanent atrial fibrillation/flutter Chronic combined systolic and diastolic congestive heart failure. Continue metoprolol succinate 50 mg p.o. daily for rate control.  On Coumadin.  Continue furosemide 40 mg p.o. daily.  CKD stage IIIa Creatinine stable, 1.00 at time of discharge.  Rectal bleeding Etiology likely secondary to supratherapeutic INR versus hemorrhoids.  No further breathing since initial presentation.  Hemoglobin stable, 11.9 at time of discharge.  Hypothyroidism: Continue Armour Thyroid 120 mg p.o. daily.  Discharge Diagnoses:  Active Problems:   Permanent atrial fibrillation (HCC)   Hypothyroidism   Chronic kidney disease (CKD), stage III (moderate) (HCC)   Chronic combined systolic and diastolic CHF (congestive heart failure) (HCC)   Right lower lobe lung mass   Hyponatremia   SOB (shortness of breath)    Discharge Instructions  Discharge Instructions    Diet - low sodium heart healthy   Complete by: As directed    Increase activity slowly   Complete by: As directed      Allergies as of 09/02/2020   No Known Allergies     Medication List    TAKE these medications   acetaminophen 500 MG tablet Commonly known as:  TYLENOL  Take 2 tablets (1,000 mg total) by mouth every 6 (six) hours as needed for mild pain or fever.   BILBERRY EXTRACT PO Take 1 tablet by mouth daily.   Calcium 150 MG Tabs Take 150 mg by mouth in the morning, at noon, and at bedtime.   Emu Oil Oil Apply 1 application topically daily.   Fish Oil 1000 MG Caps Take 1,000 mg by mouth daily.   furosemide 40 MG tablet Commonly known as: LASIX Take 1 tablet (40 mg total) by mouth daily.   Garlic 518 MG Tabs Take 100 mg by mouth daily.   lactobacillus acidophilus Tabs tablet Take 1 tablet by mouth daily.   LINOLEIC ACID-SUNFLOWER OIL PO Take 1 capsule by mouth daily.   magnesium oxide 400 MG tablet Commonly known as: MAG-OX Take 400 mg by mouth in the morning, at noon, and at bedtime.   metoprolol succinate 50 MG 24 hr tablet Commonly known as: TOPROL-XL Take 1 tablet (50 mg total) by mouth daily. Take with or immediately following a meal. What changed: additional instructions   nitroGLYCERIN 0.4 MG SL tablet Commonly known as: NITROSTAT Place 1 tablet (0.4 mg total) under the tongue every 5 (five) minutes as needed for chest pain.   OVER THE COUNTER MEDICATION Take 1 tablet by mouth daily. Collisonia root   sodium chloride 0.65 % Soln nasal spray Commonly known as: OCEAN Place 1 spray into both nostrils daily.   thyroid 120 MG tablet Commonly known as: Armour Thyroid Take 1 tablet (120 mg total) by mouth daily before breakfast.   Turmeric 500 MG Caps Take 500 mg by mouth daily.   vitamin C 100 MG tablet Take 100 mg by mouth 3 (three) times daily.   Vitamin D3 10 MCG (400 UNIT) Caps Take 400 Units by mouth in the morning and at bedtime.   Vitamin E 400 units Tabs Take 400 Units by mouth daily.   VITAMIN-B COMPLEX PO Take 1 tablet by mouth in the morning, at noon, and at bedtime.   warfarin 1 MG tablet Commonly known as: Coumadin Take as directed. If you are unsure how to take this medication, talk to  your nurse or doctor. Original instructions: Take 1 tablet (1 mg total) by mouth daily. What changed:   medication strength  how much to take  additional instructions       Follow-up Information    Jacalyn Lefevre Jesse Sans, MD. Schedule an appointment as soon as possible for a visit in 1 week(s).   Specialty: Internal Medicine Contact information: [address] [place] [address] [phone]              No Known Allergies  Consultations:  Palliative care   Procedures/Studies: DG Chest 2 View  Result Date: 08/29/2020 CLINICAL DATA:  Chest pain with intermittent shortness of breath and generalized weakness for 2 weeks. History of hypertension and congestive heart failure. EXAM: CHEST - 2 VIEW COMPARISON:  Radiographs 08/24/2020.  CT 02/01/2020. FINDINGS: Persistent low lung volumes with associated bibasilar atelectasis. Subpleural nodule posteriorly in the right lower lobe appears grossly stable, although is partly obscured by the hemidiaphragm on the frontal examination. There is mild atelectasis at both lung bases. The heart size and mediastinal contours are stable with aortic atherosclerosis. No pleural effusion pneumothorax. Degenerative changes are present throughout the thoracic spine with a chronic T12 compression deformity. IMPRESSION: No acute findings or significant changes compared with prior radiographs. Persistent low lung volumes with bibasilar atelectasis. Grossly stable subpleural  nodule posteriorly in the right lower lobe, suboptimally evaluated radiographically. Electronically Signed   By: Richardean Sale M.D.   On: 08/29/2020 14:12   DG Chest 2 View  Result Date: 08/24/2020 CLINICAL DATA:  Shortness of breath EXAM: CHEST - 2 VIEW COMPARISON:  03/10/2020 FINDINGS: Cardiac shadow remains enlarged. Aortic calcifications are again seen. The patchy rounded airspace opacity seen on the recent chest x-ray are no longer identified and were likely postinflammatory in nature. No  focal infiltrate is seen. There is a rounded soft tissue density identified on the lateral film in the posterior costophrenic angle which would correspond to that seen on prior CT examination from 02/01/2020 but has increased in size to approximately 3.4 cm in AP diameter from a prior size of approximately 16 mm. IMPRESSION: Increasing soft tissue density in the right posterior costophrenic angle when compared with prior CT examination. CT with contrast is recommended for further evaluation given this increase in size. These results will be called to the ordering clinician or representative by the Radiologist Assistant, and communication documented in the PACS or [employer]. Electronically Signed   By: Inez Catalina M.D.   On: 08/24/2020 13:07   CT CHEST ABDOMEN PELVIS W CONTRAST  Result Date: 08/29/2020 CLINICAL DATA:  Shortness of breath with abdominal distension. History pleural-based right lung mass and chronic intra-abdominal fluid collection. EXAM: CT CHEST, ABDOMEN, AND PELVIS WITH CONTRAST TECHNIQUE: Multidetector CT imaging of the chest, abdomen and pelvis was performed following the standard protocol during bolus administration of intravenous contrast. CONTRAST:  16mL OMNIPAQUE IOHEXOL 300 MG/ML  SOLN COMPARISON:  Radiographs today.  CTs 02/01/2020 and 10/19/2019. FINDINGS: CT CHEST FINDINGS Cardiovascular: Diffuse atherosclerosis of the aorta, great vessels and coronary arteries. No acute vascular findings are seen. There is stable mild central enlargement of the pulmonary arteries, mild calcifications of the aortic valve and mild cardiomegaly. No significant residual pericardial fluid. Mediastinum/Nodes: There are no enlarged mediastinal, hilar or axillary lymph nodes. Small mediastinal and hilar lymph nodes appear unchanged. No significant abnormality of the thyroid gland, trachea or esophagus. Lungs/Pleura: There is no pleural effusion or pneumothorax. Mild centrilobular emphysema and central  airway thickening. The previously demonstrated subpleural nodule in the right lower lobe has significantly enlarged in the interval, now measuring up to 4.5 x 3.5 cm on image 88/5. This previously measured 3.2 x 1.2 cm. Currently, this mass demonstrates heterogeneous enhancement and probable central necrosis, highly suspicious for a primary bronchogenic carcinoma. No new nodules are seen. Musculoskeletal/Chest wall: No chest wall mass or suspicious osseous lesions. There are old rib fractures on the right. Probable sebaceous cyst in the left anterior chest wall measuring 16 mm on image 10/3 is unchanged. CT ABDOMEN AND PELVIS FINDINGS Hepatobiliary: No suspicious hepatic lesions are identified. There is a probable small cyst anteriorly in the right lobe on image 48/3. There is probable passive congestion within the liver. A tiny calcified gallstone is present without gallbladder wall thickening or biliary dilatation. No evidence of gallstones, gallbladder wall thickening or biliary dilatation. Pancreas: Unremarkable. No pancreatic ductal dilatation or surrounding inflammatory changes. Spleen: Normal in size without focal abnormality. Adrenals/Urinary Tract: Stable enlargement of the left adrenal gland, likely reflecting hyperplasia. The right adrenal gland appears normal. Stable bilateral renal cysts. No evidence of enhancing renal mass, urinary tract calculus or hydronephrosis. The bladder appears normal for its degree of distention. Stomach/Bowel: No enteric contrast administered. The stomach is decompressed without apparent abnormality. No evidence of bowel wall thickening, distension or surrounding inflammatory  change. No primary bowel malignancy identified. Vascular/Lymphatic: A presumed lymph node in the right ileocolonic mesentery measuring 1.9 x 1.7 cm is unchanged (image 74/3). No progressive adenopathy. Diffuse aortic and branch vessel atherosclerosis without aneurysm or large vessel occlusion.  Reproductive: The uterus and ovaries appear normal. No adnexal mass. Other: Interval development of a moderate amount of ascites throughout the peritoneal cavity. There is extensive peritoneal and omental soft tissue nodularity, especially in the left upper quadrant where there is an ill-defined soft tissue mass measuring approximately 7.7 x 3.5 cm on image 49/3. There is anterior omental nodularity measuring 1.3 cm on image 74/3 and 2.3 cm on image 79/3. Nodularity extends into the umbilicus. There is a right lower quadrant peritoneal nodule measuring 2.5 cm on image 85/3. Complex right lower quadrant fluid collection measures 5.8 x 4.5 cm on image 79/3, similar to previous CT. Musculoskeletal: No acute or significant osseous findings. Stable mild chronic compression deformities, most notable at T12 and L3. IMPRESSION: 1. Significant interval enlargement of right lower lobe lung mass, appearing centrally necrotic and suspicious for primary bronchogenic carcinoma. 2. Interval development of ascites with widespread peritoneal nodularity highly suspicious for peritoneal carcinomatosis. Several of these peritoneal nodules should be amenable to percutaneous tissue sampling. 3. The complex right lower quadrant fluid collection has not significantly changed from previous CT of 6 months ago and may reflect a postsurgical fluid collection post appendectomy. 4. No primary bowel or ovarian malignancy identified. No evidence of bowel or ureteral obstruction. 5. Coronary and Aortic Atherosclerosis (ICD10-I70.0). Electronically Signed   By: Richardean Sale M.D.   On: 08/29/2020 17:47      Subjective: Patient seen and examined at bedside, resting comfortably.  No family present.  Anticipating discharge home with home hospice today.  Discussed plan of care with patient's daughter via telephone this morning; wishes to continue with Coumadin therapy following discharge.  No other questions or concerns at this time.  Patient  currently denies headache, no fever/chills/night sweats, no chest pain, no abdominal pain, no current shortness of breath, no weakness, no cough/congestion.  No acute concerns overnight per nursing staff.  Discharge Exam: Vitals:   09/01/20 2053 09/02/20 0444  BP: 126/70 122/74  Pulse:  70  Resp: 18 18  Temp: 97.8 F (36.6 C) 98 F (36.7 C)  SpO2:  99%   Vitals:   09/01/20 1334 09/01/20 2053 09/02/20 0444 09/02/20 0450  BP: 128/81 126/70 122/74   Pulse: 77  70   Resp: 20 18 18    Temp: 97.9 F (36.6 C) 97.8 F (36.6 C) 98 F (36.7 C)   TempSrc: Oral Oral Oral   SpO2: 100%  99%   Weight:    72.8 kg  Height:        General: Pt is alert, awake, not in acute distress, elderly in appearance Cardiovascular: RRR, S1/S2 +, no rubs, no gallops Respiratory: CTA bilaterally, no wheezing, no rhonchi, on room air at rest Abdominal: Soft, NT, ND, bowel sounds + Extremities: no edema, no cyanosis    The results of significant diagnostics from this hospitalization (including imaging, microbiology, ancillary and laboratory) are listed below for reference.     Microbiology: Recent Results (from the past 240 hour(s))  Resp Panel by RT-PCR (Flu A&B, Covid) Nasopharyngeal Swab     Status: None   Collection Time: 08/29/20  9:04 PM   Specimen: Nasopharyngeal Swab; Nasopharyngeal(NP) swabs in vial transport medium  Result Value Ref Range Status   SARS Coronavirus 2 by RT  PCR NEGATIVE NEGATIVE Final    Comment: (NOTE) SARS-CoV-2 target nucleic acids are NOT DETECTED.  The SARS-CoV-2 RNA is generally detectable in upper respiratory specimens during the acute phase of infection. The lowest concentration of SARS-CoV-2 viral copies this assay can detect is 138 copies/mL. A negative result does not preclude SARS-Cov-2 infection and should not be used as the sole basis for treatment or other patient management decisions. A negative result may occur with  improper specimen collection/handling,  submission of specimen other than nasopharyngeal swab, presence of viral mutation(s) within the areas targeted by this assay, and inadequate number of viral copies(<138 copies/mL). A negative result must be combined with clinical observations, patient history, and epidemiological information. The expected result is Negative.  Fact Sheet for Patients:  EntrepreneurPulse.com.au  Fact Sheet for Healthcare Providers:  IncredibleEmployment.be  This test is no t yet approved or cleared by the [place] FDA and  has been authorized for detection and/or diagnosis of SARS-CoV-2 by FDA under an Emergency Use Authorization (EUA). This EUA will remain  in effect (meaning this test can be used) for the duration of the COVID-19 declaration under Section 564(b)(1) of the Act, 21 U.S.C.section 360bbb-3(b)(1), unless the authorization is terminated  or revoked sooner.       Influenza A by PCR NEGATIVE NEGATIVE Final   Influenza B by PCR NEGATIVE NEGATIVE Final    Comment: (NOTE) The Xpert Xpress SARS-CoV-2/FLU/RSV plus assay is intended as an aid in the diagnosis of influenza from Nasopharyngeal swab specimens and should not be used as a sole basis for treatment. Nasal washings and aspirates are unacceptable for Xpert Xpress SARS-CoV-2/FLU/RSV testing.  Fact Sheet for Patients: EntrepreneurPulse.com.au  Fact Sheet for Healthcare Providers: IncredibleEmployment.be  This test is not yet approved or cleared by the [place] FDA and has been authorized for detection and/or diagnosis of SARS-CoV-2 by FDA under an Emergency Use Authorization (EUA). This EUA will remain in effect (meaning this test can be used) for the duration of the COVID-19 declaration under Section 564(b)(1) of the Act, 21 U.S.C. section 360bbb-3(b)(1), unless the authorization is terminated or revoked.  Performed at [facility], [place] [address], [place], [place] [address]      Labs: BNP (last 3 results) Recent Labs    10/19/19 2045 03/10/20 0411 08/29/20 1339  BNP 475.4* 286.3* 063.0*   Basic Metabolic Panel: Recent Labs  Lab 08/29/20 1339 08/30/20 0128 08/31/20 0645 09/01/20 1123  NA 127* 132* 130* 130*  K 4.0 3.5 4.0 3.5  CL 88* 91* 91* 91*  CO2 28 32 29 32  GLUCOSE 106* 107* 114* 154*  BUN 33* 30* 41* 46*  CREATININE 1.01* 1.08* 1.22* 1.00  CALCIUM 8.8* 8.5* 8.8* 8.3*   Liver Function Tests: Recent Labs  Lab 08/29/20 1630  AST 25  ALT 16  ALKPHOS 76  BILITOT 0.6  PROT 6.5  ALBUMIN 2.2*   No results for input(s): LIPASE, AMYLASE in the last 168 hours. No results for input(s): AMMONIA in the last 168 hours. CBC: Recent Labs  Lab 08/29/20 1339 08/30/20 0128 08/31/20 0645  WBC 10.4 9.2 10.5  NEUTROABS  --   --  8.2*  HGB 11.7* 10.9* 11.9*  HCT 35.2* 33.3* 35.8*  MCV 90.3 90.0 89.9  PLT 432* 394 380   Cardiac Enzymes: No results for input(s): CKTOTAL, CKMB, CKMBINDEX, TROPONINI in the last 168 hours. BNP: Invalid input(s): POCBNP CBG: No results for input(s): GLUCAP in the last 168 hours. D-Dimer No  results for input(s): DDIMER in the last 72 hours. Hgb A1c No results for input(s): HGBA1C in the last 72 hours. Lipid Profile No results for input(s): CHOL, HDL, LDLCALC, TRIG, CHOLHDL, LDLDIRECT in the last 72 hours. Thyroid function studies No results for input(s): TSH, T4TOTAL, T3FREE, THYROIDAB in the last 72 hours.  Invalid input(s): FREET3 Anemia work up No results for input(s): VITAMINB12, FOLATE, FERRITIN, TIBC, IRON, RETICCTPCT in the last 72 hours. Urinalysis    Component Value Date/Time   COLORURINE YELLOW 08/29/2020 1335   APPEARANCEUR CLEAR 08/29/2020 1335   LABSPEC 1.006 08/29/2020 1335   PHURINE 6.0 08/29/2020 1335   GLUCOSEU NEGATIVE 08/29/2020 1335   HGBUR SMALL (A) 08/29/2020 1335   BILIRUBINUR NEGATIVE 08/29/2020 1335   KETONESUR NEGATIVE 08/29/2020 1335    PROTEINUR NEGATIVE 08/29/2020 1335   NITRITE NEGATIVE 08/29/2020 1335   LEUKOCYTESUR NEGATIVE 08/29/2020 1335   Sepsis Labs Invalid input(s): PROCALCITONIN,  WBC,  LACTICIDVEN Microbiology Recent Results (from the past 240 hour(s))  Resp Panel by RT-PCR (Flu A&B, Covid) Nasopharyngeal Swab     Status: None   Collection Time: 08/29/20  9:04 PM   Specimen: Nasopharyngeal Swab; Nasopharyngeal(NP) swabs in vial transport medium  Result Value Ref Range Status   SARS Coronavirus 2 by RT PCR NEGATIVE NEGATIVE Final    Comment: (NOTE) SARS-CoV-2 target nucleic acids are NOT DETECTED.  The SARS-CoV-2 RNA is generally detectable in upper respiratory specimens during the acute phase of infection. The lowest concentration of SARS-CoV-2 viral copies this assay can detect is 138 copies/mL. A negative result does not preclude SARS-Cov-2 infection and should not be used as the sole basis for treatment or other patient management decisions. A negative result may occur with  improper specimen collection/handling, submission of specimen other than nasopharyngeal swab, presence of viral mutation(s) within the areas targeted by this assay, and inadequate number of viral copies(<138 copies/mL). A negative result must be combined with clinical observations, patient history, and epidemiological information. The expected result is Negative.  Fact Sheet for Patients:  EntrepreneurPulse.com.au  Fact Sheet for Healthcare Providers:  IncredibleEmployment.be  This test is no t yet approved or cleared by the [place] FDA and  has been authorized for detection and/or diagnosis of SARS-CoV-2 by FDA under an Emergency Use Authorization (EUA). This EUA will remain  in effect (meaning this test can be used) for the duration of the COVID-19 declaration under Section 564(b)(1) of the Act, 21 U.S.C.section 360bbb-3(b)(1), unless the authorization is terminated  or revoked  sooner.       Influenza A by PCR NEGATIVE NEGATIVE Final   Influenza B by PCR NEGATIVE NEGATIVE Final    Comment: (NOTE) The Xpert Xpress SARS-CoV-2/FLU/RSV plus assay is intended as an aid in the diagnosis of influenza from Nasopharyngeal swab specimens and should not be used as a sole basis for treatment. Nasal washings and aspirates are unacceptable for Xpert Xpress SARS-CoV-2/FLU/RSV testing.  Fact Sheet for Patients: EntrepreneurPulse.com.au  Fact Sheet for Healthcare Providers: IncredibleEmployment.be  This test is not yet approved or cleared by the [place] FDA and has been authorized for detection and/or diagnosis of SARS-CoV-2 by FDA under an Emergency Use Authorization (EUA). This EUA will remain in effect (meaning this test can be used) for the duration of the COVID-19 declaration under Section 564(b)(1) of the Act, 21 U.S.C. section 360bbb-3(b)(1), unless the authorization is terminated or revoked.  Performed at [facility], [place] [address], [place], [place] [address]  Time coordinating discharge: Over 30 minutes  SIGNED:   Donnamarie Poag [place] DO  [facility] 09/02/2020, 11:25 AM

## 2020-09-23 DEATH — deceased

## 2021-11-09 IMAGING — CT CT ANGIO CHEST-ABD-PELV FOR DISSECTION W/ AND WO/W CM
2 of 4 series · 8 of 46 positions shown, 9 images · IV contrast (APPLIED)
Comparison: Chest CT-10/19/2019; CT abdomen and pelvis-08/05/2019;
05/29/2019

CLINICAL DATA: Chest/back pain.  Evaluate for dissection.

EXAM:
CT ANGIOGRAPHY CHEST, ABDOMEN AND PELVIS
TECHNIQUE: Non-contrast CT of the chest was initially obtained.

[Series 5: chest wo · axial · 0.70mm/px · z∈[-236,-26]mm · 5 of 56 slices shown, 6 images]
[im 8/56  soft-tissue]
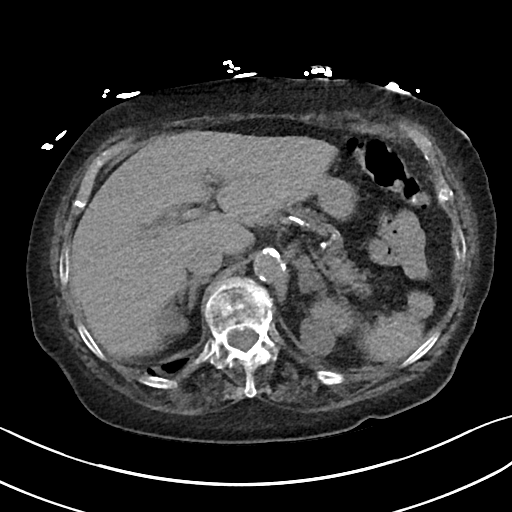
[im 8/56  bone]
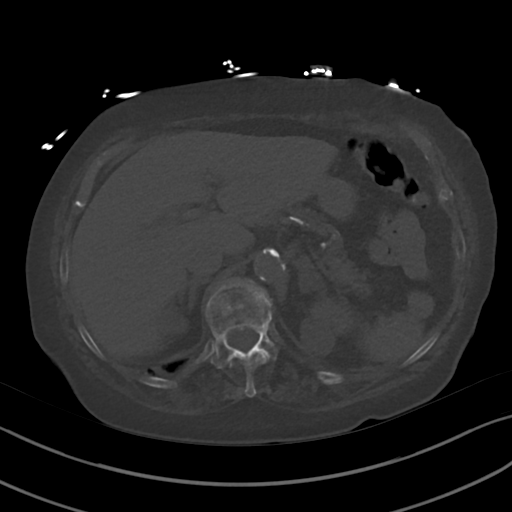
[im 18/56  soft-tissue]
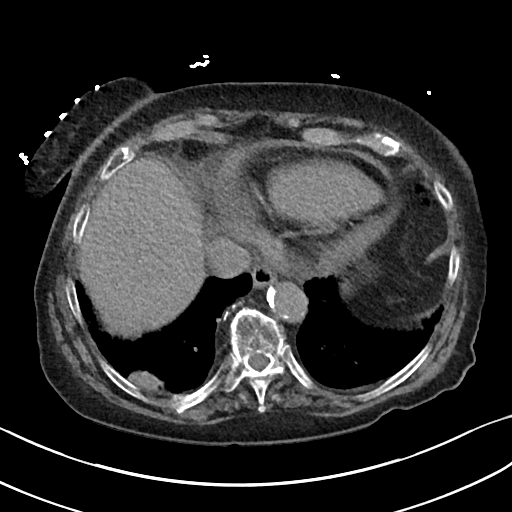
[im 29/56  soft-tissue]
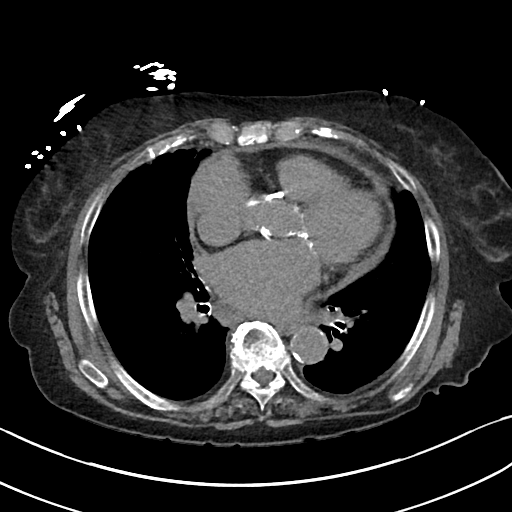
[im 40/56  soft-tissue]
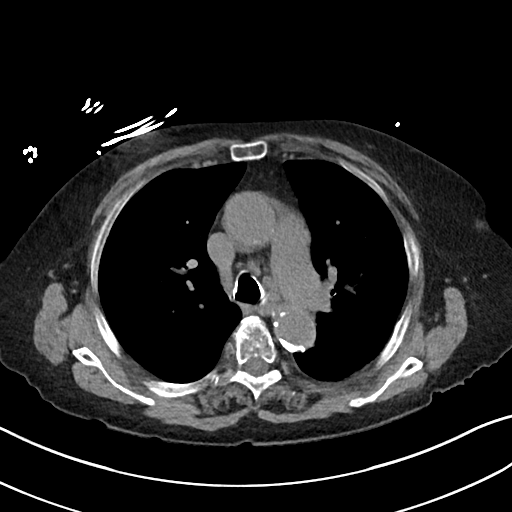
[im 50/56  soft-tissue]
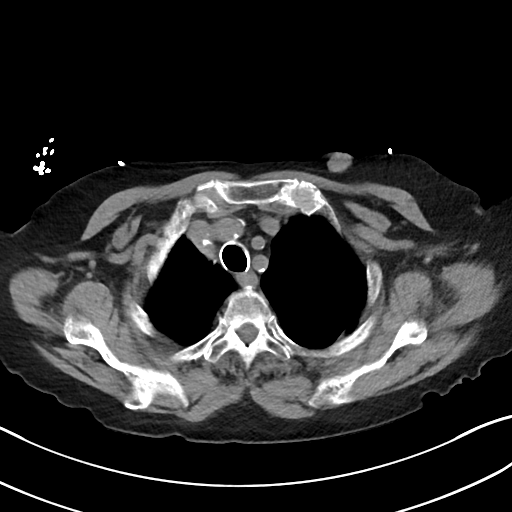

[Series 9: cor · coronal · 0.72mm/px · 3 of 143 slices shown]
[im 36/143  soft-tissue]
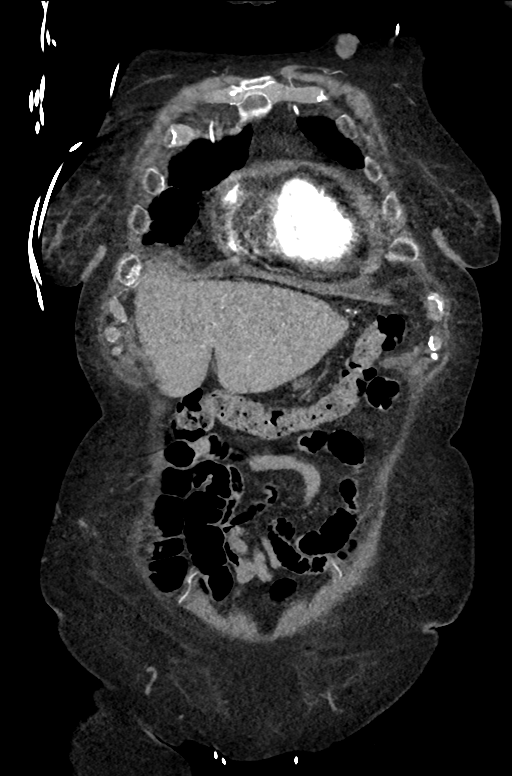
[im 72/143  soft-tissue]
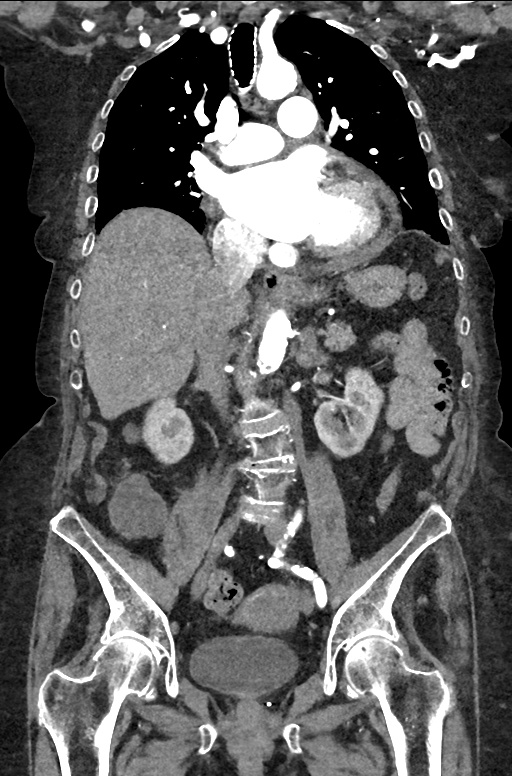
[im 107/143  soft-tissue]
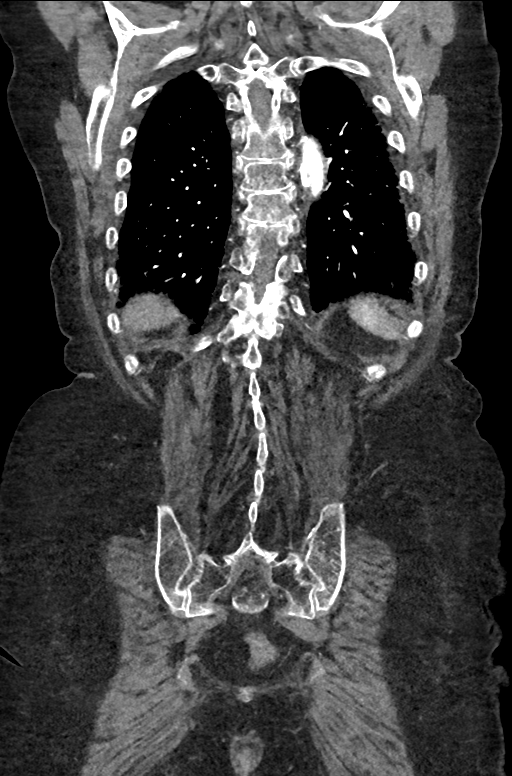

[8 of 46 positions shown; findings below may reference images not displayed]

Multidetector CT imaging through the chest, abdomen and pelvis was
performed using the standard protocol during bolus administration of
intravenous contrast. Multiplanar reconstructed images and MIPs were
obtained and reviewed to evaluate the vascular anatomy.

CONTRAST:  100mL OMNIPAQUE IOHEXOL 350 MG/ML SOLN
FINDINGS: CTA CHEST FINDINGS

Vascular Findings:

Review of the precontrast images is negative for the presence of an
intramural hematoma. No evidence of thoracic aortic aneurysm or
dissection on this nongated examination.

There is a large amount of eccentric predominantly calcified
atherosclerotic plaque throughout the thoracic aorta, including
slightly irregular noncalcified plaque/mural thrombus involving the
posterior aspect of the aortic arch (image 26, series 6), not
resulting in a hemodynamically significant stenosis.

Cardiomegaly. Small pericardial effusion, increased in size compared
to the 10/19/2019 examination. Coronary artery calcifications.

Although this examination was not tailored for the evaluation the
pulmonary arteries, there are no discrete filling defects within the
central pulmonary arterial tree to suggest central pulmonary
embolism. Borderline enlarged caliber of the main pulmonary artery
measuring 32 mm in diameter.

-------------------------------------------------------------

Thoracic aortic measurements:

Sinotubular junction

29 mm as measured in greatest oblique short axis coronal dimension.

Proximal ascending aorta

32 mm as measured in greatest oblique short axis axial dimension at
the level of the main pulmonary artery and 32 mm in greatest oblique
short axis coronal diameter (coronal image 59, series 9)

Aortic arch aorta

29 mm as measured in greatest oblique short axis sagittal dimension.

Proximal descending thoracic aorta

25 mm as measured in greatest oblique short axis axial dimension at
the level of the main pulmonary artery.

Distal descending thoracic aorta

23 mm as measured in greatest oblique short axis axial dimension at
the level of the diaphragmatic hiatus.

Review of the MIP images confirms the above findings.

-------------------------------------------------------------

Non-Vascular Findings:

Mediastinum/Lymph Nodes: Scattered mediastinal hilar lymph nodes are
unchanged to slightly reduced in size in the interval with index AP
window lymph node now measuring 0.9 cm in greatest short axis
diameter (image 34, series 6, previously, 1.3 cm. Bilateral hilar
lymph nodes are grossly unchanged with index right suprahilar and
index left infrahilar lymph nodes both measuring approximately 1 cm
greatest short axis diameter (right-image 51, series 6; left-image
56, series 6).

Lungs/Pleura: Mild apical predominant centrilobular emphysematous
change. Minimal dependent subpleural ground-glass atelectasis.

Subpleural nodular airspace opacity involving the subpleural aspect
of the right lower lobe has increased in size, currently measuring
3.3 x 1.2 cm (image 91, series 7, previously, 2.6 x 1.1 cm when
compared to abdominal CT performed 05/29/2019.

Musculoskeletal: No acute or aggressive osseous abnormalities.
Stigmata of dish within the thoracic spine. The thyroid appears
atrophic.

Note is made of an approximately 1.8 x 1.3 cm hypoattenuating nodule
within the subdermal surface of the medial aspect the left upper
chest (image 16, series 6, similar to the 10/19/2019 examination and
while nonspecific again may represent a sebaceous cyst.

_________________________________________________________

_________________________________________________________

CTA ABDOMEN AND PELVIS FINDINGS

VASCULAR

Aorta: Scattered large amount of irregular mixed calcified and
noncalcified atherosclerotic plaque throughout the tortuous but
normal caliber abdominal aorta, not resulting in hemodynamically
significant stenosis. No abdominal aortic dissection or periaortic
stranding.

Celiac: There is a minimal amount of mixed calcified and
noncalcified atherosclerotic plaque involving the origin of the
celiac artery, not resulting in a hemodynamically significant
stenosis.

SMA: There is a moderate amount of mixed calcified and noncalcified
atherosclerotic plaque involving the main trunk of the SMA
approaching 50% luminal narrowing at its mid/distal aspect (image
138, series 6). The distal tributaries the SMA appear patent without
discrete intraluminal filling defect to suggest distal embolism.

Renals: Solitary bilaterally; there is a minimal to moderate amount
of eccentric calcified atherosclerotic plaque involving the origin
of the left renal artery approaching 50% luminal narrowing (coronal
image 73, series 9). There is a minimal amount of eccentric mixed
calcified and noncalcified atherosclerotic plaque involving the
origin of the right renal artery, not definitely resulting in
hemodynamically significant narrowing. No vessel irregularity to
suggest FMD.

IMA: Disease at its origin though remains patent.

Inflow: There is a minimal amount of eccentric mixed calcified and
noncalcified atherosclerotic plaque involving the bilateral tortuous
but normal caliber common iliac arteries, not resulting in
hemodynamically significant stenosis. The bilateral internal iliac
arteries are diseased but remain patent.

The bilateral external iliac arteries are mildly diseased and
tortuous though without a hemodynamically significant narrowing.

There is a minimal amount of calcified atherosclerotic plaque
involving the bilateral common femoral arteries, not resulting in
hemodynamically significant stenosis. Eccentric noncalcified
atherosclerotic plaque involves the origin of the right deep femoral
artery approaching 50% luminal narrowing (image 249, series 6). The
imaged portions of the left deep femoral and bilateral superficial
femoral arteries appear patent without a definitive hemodynamically
significant narrowing.

Veins: The IVC and pelvic venous systems appear patent on this
arterial phase examination.

Review of the MIP images confirms the above findings.

_________________________________________________________

NON-VASCULAR

Hepatobiliary: There is mild nodularity hepatic contour. No discrete
hepatic lesions. Ill-defined layering gallstones are seen within the
neck of the gallbladder (image 145, series 6). The gallbladder
appears distended however there is no definitive gallbladder wall
thickening or pericholecystic stranding. No intra or extrahepatic
biliary duct dilatation. No ascites.

Pancreas: Normal appearance of the pancreas.

Spleen: Normal appearance of the spleen. Several punctate splenules
are noted about the splenic hilum.

Adrenals/Urinary Tract: There is symmetric enhancement of the
bilateral kidneys. Renal cysts are seen bilaterally with dominant
exophytic cyst arising from the anterior superior pole the left
kidney measuring 1.6 cm (image 126, series 6 and dominant cyst
arising from the superior pole the right kidney measuring 3.2 cm
(image 128, series 6). No definite evidence of nephrolithiasis on
this postcontrast examination. There is a minimal amount of likely
age and body habitus related bilateral perinephric stranding. No
urine obstruction.

Mild thickening of the left adrenal gland without discrete nodule.
Normal appearance of the right adrenal gland.

Normal appearance of the urinary bladder given degree of distention.

Stomach/Bowel: Scattered colonic diverticulosis without evidence of
superimposed acute diverticulitis. Normal appearance of the terminal
ileum.

Interval removal of periappendiceal drainage catheter with interval
appendectomy though there remains an approximately 4.9 x 4.2 x
cm apparent fluid collection within right lower abdominal quadrant.
No adjacent mesenteric stranding.

Moderate colonic stool burden without evidence of enteric
obstruction. No discrete areas of bowel wall thickening. No
pneumoperitoneum, pneumatosis or portal venous gas.

Lymphatic: There is an approximately 1.7 cm presumed lymph node
within the root of the right lower quadrant abdominal mesentery
(image 173, series 6, previously, 1.2 cm. Additional scattered
retroperitoneal lymph nodes are numerous though individually not
enlarged by size criteria. No additional bulky retroperitoneal,
mesenteric, pelvic or inguinal

Reproductive: Normal appearance of the pelvic organs for age. No
discrete adnexal lesion.

Other: Note is made of a small mesenteric fat containing
periumbilical hernia. There is a minimal amount of subcutaneous
edema about the midline of the low back.

Musculoskeletal: No acute or aggressive osseous abnormalities.
Unchanged moderate (approximately 50%) compression deformity
involving the superior endplate of the T12 vertebral body, similar
to abdominal CT performed 05/29/2019. Mild-to-moderate scoliotic
curvature of the thoracolumbar spine associated multilevel moderate
severe DDD, worse at L1-L2 and L4-L5.

Review of the MIP images confirms the above findings.
IMPRESSION: Chest CTA impression:

1. No acute cardiopulmonary disease. Specifically, no evidence of
thoracic aortic aneurysm or dissection on this nongated examination.
No evidence of central pulmonary embolism.
2. Cardiomegaly with small pericardial effusion, increased in size
compared to the 10/19/2019 examination - further evaluation cardiac
echo could be performed as clinically indicated.
3. Coronary artery calcifications. Aortic Atherosclerosis
([id number]).
4. Enlargement right lower lobe sub pleural masslike opacity
currently measuring 3.3 cm, previously 2.6 cm, when compared to
abdominal CT [DATE]. As malignancy is not excluded on the basis of
this examination, further evaluation with PET-CT could performed as
clinically indicated.
5. Redemonstrated nonspecific prominent though non pathologically
mediastinal and bilateral hilar lymph nodes, unchanged to slightly
improved compared to the 10/19/2018 examination.

Abdomen and pelvic CTA impression:

1. Large amount of mixed calcified and noncalcified atherosclerotic
plaque throughout tortuous but normal caliber abdominal aorta, not
resulting in hemodynamically significant stenosis.
2. Post appendectomy and removal of percutaneous drainage catheter
with residual/recurrent approximately 4.9 cm apparent fluid
collection/phlegmon within the right lower abdominal quadrant and
interval increase in size of presumably reactive lymph node within
the root of the right lower quadrant abdominal mesentery, currently
measuring 1.7 cm, previously 1.2 cm. No evidence of enteric
obstruction.
3. Cholelithiasis with distension of the gallbladder but without
gallbladder wall thickening or pericholecystic fluid. If there is
clinical concern for acute cholecystitis, further evaluation with
right upper quadrant abdominal ultrasound could be performed as
clinically indicated.
4. Nodularity hepatic contour as could be seen in the setting of
early cirrhotic change. Correlation with LFTs is advised.
5. Unchanged moderate (approximately 25%) compression deformity
involving the T12 vertebral body.
6. Mild-to-moderate scoliotic curvature of the thoracolumbar spine
associated multilevel moderate severe DDD, worse at L1-L2 and L4-L5.
